# Patient Record
Sex: Female | Born: 1960 | Race: White | Hispanic: No | Marital: Married | Smoking: Never smoker
Health system: Southern US, Community
[De-identification: ages and names within clinical notes are randomized; demographics above are authoritative.]

## PROBLEM LIST (undated history)

## (undated) DIAGNOSIS — R112 Nausea with vomiting, unspecified: Secondary | ICD-10-CM

## (undated) DIAGNOSIS — Z9889 Other specified postprocedural states: Secondary | ICD-10-CM

## (undated) DIAGNOSIS — K219 Gastro-esophageal reflux disease without esophagitis: Secondary | ICD-10-CM

## (undated) DIAGNOSIS — R519 Headache, unspecified: Secondary | ICD-10-CM

## (undated) DIAGNOSIS — IMO0002 Reserved for concepts with insufficient information to code with codable children: Secondary | ICD-10-CM

## (undated) DIAGNOSIS — L719 Rosacea, unspecified: Secondary | ICD-10-CM

## (undated) DIAGNOSIS — M1990 Unspecified osteoarthritis, unspecified site: Secondary | ICD-10-CM

## (undated) DIAGNOSIS — T7840XA Allergy, unspecified, initial encounter: Secondary | ICD-10-CM

## (undated) DIAGNOSIS — E119 Type 2 diabetes mellitus without complications: Secondary | ICD-10-CM

## (undated) DIAGNOSIS — Z9289 Personal history of other medical treatment: Secondary | ICD-10-CM

## (undated) DIAGNOSIS — M7989 Other specified soft tissue disorders: Secondary | ICD-10-CM

## (undated) DIAGNOSIS — Z8489 Family history of other specified conditions: Secondary | ICD-10-CM

## (undated) DIAGNOSIS — E785 Hyperlipidemia, unspecified: Secondary | ICD-10-CM

## (undated) DIAGNOSIS — J069 Acute upper respiratory infection, unspecified: Secondary | ICD-10-CM

## (undated) DIAGNOSIS — K649 Unspecified hemorrhoids: Secondary | ICD-10-CM

## (undated) DIAGNOSIS — R87619 Unspecified abnormal cytological findings in specimens from cervix uteri: Secondary | ICD-10-CM

## (undated) DIAGNOSIS — R87612 Low grade squamous intraepithelial lesion on cytologic smear of cervix (LGSIL): Secondary | ICD-10-CM

## (undated) DIAGNOSIS — J189 Pneumonia, unspecified organism: Secondary | ICD-10-CM

## (undated) DIAGNOSIS — G4483 Primary cough headache: Secondary | ICD-10-CM

## (undated) DIAGNOSIS — R42 Dizziness and giddiness: Secondary | ICD-10-CM

## (undated) DIAGNOSIS — R51 Headache: Secondary | ICD-10-CM

## (undated) DIAGNOSIS — J45909 Unspecified asthma, uncomplicated: Secondary | ICD-10-CM

## (undated) DIAGNOSIS — G500 Trigeminal neuralgia: Secondary | ICD-10-CM

## (undated) DIAGNOSIS — R7303 Prediabetes: Secondary | ICD-10-CM

## (undated) DIAGNOSIS — I10 Essential (primary) hypertension: Secondary | ICD-10-CM

## (undated) DIAGNOSIS — R7301 Impaired fasting glucose: Secondary | ICD-10-CM

## (undated) HISTORY — DX: Headache, unspecified: R51.9

## (undated) HISTORY — DX: Reserved for concepts with insufficient information to code with codable children: IMO0002

## (undated) HISTORY — DX: Gastro-esophageal reflux disease without esophagitis: K21.9

## (undated) HISTORY — DX: Trigeminal neuralgia: G50.0

## (undated) HISTORY — DX: Low grade squamous intraepithelial lesion on cytologic smear of cervix (LGSIL): R87.612

## (undated) HISTORY — PX: LEEP: SHX91

## (undated) HISTORY — DX: Morbid (severe) obesity due to excess calories: E66.01

## (undated) HISTORY — DX: Impaired fasting glucose: R73.01

## (undated) HISTORY — DX: Unspecified abnormal cytological findings in specimens from cervix uteri: R87.619

## (undated) HISTORY — DX: Primary cough headache: G44.83

## (undated) HISTORY — DX: Essential (primary) hypertension: I10

## (undated) HISTORY — DX: Type 2 diabetes mellitus without complications: E11.9

## (undated) HISTORY — DX: Hyperlipidemia, unspecified: E78.5

## (undated) HISTORY — DX: Other specified soft tissue disorders: M79.89

## (undated) HISTORY — DX: Unspecified asthma, uncomplicated: J45.909

## (undated) HISTORY — DX: Allergy, unspecified, initial encounter: T78.40XA

## (undated) HISTORY — DX: Headache: R51

## (undated) HISTORY — DX: Rosacea, unspecified: L71.9

## (undated) HISTORY — PX: CHOLECYSTECTOMY: SHX55

## (undated) HISTORY — DX: Unspecified osteoarthritis, unspecified site: M19.90

---

## 2009-10-23 ENCOUNTER — Encounter: Admission: RE | Admit: 2009-10-23 | Discharge: 2009-12-14 | Payer: Self-pay | Admitting: Sports Medicine

## 2011-09-09 ENCOUNTER — Ambulatory Visit: Payer: BC Managed Care – PPO | Attending: Orthopedic Surgery | Admitting: Physical Therapy

## 2011-09-09 DIAGNOSIS — M25669 Stiffness of unspecified knee, not elsewhere classified: Secondary | ICD-10-CM | POA: Insufficient documentation

## 2011-09-09 DIAGNOSIS — M25569 Pain in unspecified knee: Secondary | ICD-10-CM | POA: Insufficient documentation

## 2011-09-09 DIAGNOSIS — M6281 Muscle weakness (generalized): Secondary | ICD-10-CM | POA: Insufficient documentation

## 2011-09-12 ENCOUNTER — Ambulatory Visit: Payer: BC Managed Care – PPO | Admitting: Physical Therapy

## 2011-09-16 ENCOUNTER — Ambulatory Visit: Payer: BC Managed Care – PPO | Admitting: Physical Therapy

## 2011-09-19 ENCOUNTER — Ambulatory Visit: Payer: BC Managed Care – PPO | Admitting: Physical Therapy

## 2011-09-23 ENCOUNTER — Encounter: Payer: BC Managed Care – PPO | Admitting: Physical Therapy

## 2011-09-24 ENCOUNTER — Ambulatory Visit: Payer: BC Managed Care – PPO | Admitting: Physical Therapy

## 2011-09-26 ENCOUNTER — Ambulatory Visit: Payer: BC Managed Care – PPO | Admitting: Physical Therapy

## 2011-09-30 ENCOUNTER — Encounter: Payer: BC Managed Care – PPO | Admitting: Physical Therapy

## 2011-10-03 ENCOUNTER — Ambulatory Visit: Payer: BC Managed Care – PPO | Attending: Orthopedic Surgery | Admitting: Physical Therapy

## 2011-10-03 DIAGNOSIS — M6281 Muscle weakness (generalized): Secondary | ICD-10-CM | POA: Insufficient documentation

## 2011-10-03 DIAGNOSIS — M25569 Pain in unspecified knee: Secondary | ICD-10-CM | POA: Insufficient documentation

## 2011-10-03 DIAGNOSIS — M25669 Stiffness of unspecified knee, not elsewhere classified: Secondary | ICD-10-CM | POA: Insufficient documentation

## 2011-10-07 ENCOUNTER — Encounter: Payer: BC Managed Care – PPO | Admitting: Physical Therapy

## 2012-03-03 ENCOUNTER — Other Ambulatory Visit (HOSPITAL_BASED_OUTPATIENT_CLINIC_OR_DEPARTMENT_OTHER): Payer: Self-pay | Admitting: Family Medicine

## 2012-03-03 ENCOUNTER — Ambulatory Visit (HOSPITAL_BASED_OUTPATIENT_CLINIC_OR_DEPARTMENT_OTHER)
Admission: RE | Admit: 2012-03-03 | Discharge: 2012-03-03 | Disposition: A | Discharge status: Home / Self Care | Payer: BC Managed Care – PPO | Source: Ambulatory Visit | Attending: Family Medicine | Admitting: Family Medicine

## 2012-03-03 DIAGNOSIS — R51 Headache: Secondary | ICD-10-CM | POA: Insufficient documentation

## 2012-03-03 DIAGNOSIS — J329 Chronic sinusitis, unspecified: Secondary | ICD-10-CM

## 2012-03-20 LAB — HM PAP SMEAR: HM Pap smear: ABNORMAL

## 2012-05-06 ENCOUNTER — Ambulatory Visit: Payer: BC Managed Care – PPO | Admitting: Physical Therapy

## 2012-08-26 HISTORY — PX: OTHER SURGICAL HISTORY: SHX169

## 2012-11-10 ENCOUNTER — Telehealth: Payer: Self-pay | Admitting: Family Medicine

## 2012-11-10 NOTE — Telephone Encounter (Signed)
This medication was called in to her [facility]. PG

## 2012-11-10 NOTE — Telephone Encounter (Signed)
Lost RX for Rosacea was seen last Tuesday. Pharmacy: [facility]

## 2012-11-12 DIAGNOSIS — K219 Gastro-esophageal reflux disease without esophagitis: Secondary | ICD-10-CM | POA: Insufficient documentation

## 2012-12-02 ENCOUNTER — Encounter: Payer: Self-pay | Admitting: Family Medicine

## 2012-12-02 ENCOUNTER — Ambulatory Visit (INDEPENDENT_AMBULATORY_CARE_PROVIDER_SITE_OTHER): Payer: BC Managed Care – PPO | Admitting: Family Medicine

## 2012-12-02 ENCOUNTER — Encounter: Payer: BC Managed Care – PPO | Admitting: Family Medicine

## 2012-12-02 VITALS — BP 124/78 | HR 62 | Ht 63.5 in | Wt 256.0 lb

## 2012-12-02 DIAGNOSIS — R51 Headache: Secondary | ICD-10-CM

## 2012-12-02 DIAGNOSIS — R42 Dizziness and giddiness: Secondary | ICD-10-CM

## 2012-12-02 MED ORDER — CELECOXIB 200 MG PO CAPS: 200.0000 mg | ORAL_CAPSULE | Freq: Two times a day (BID) | ORAL | Status: AC

## 2012-12-02 MED ORDER — MECLIZINE HCL 25 MG PO TABS: 25.0000 mg | ORAL_TABLET | Freq: Three times a day (TID) | ORAL | Status: DC | PRN

## 2012-12-02 NOTE — Patient Instructions (Addendum)
1)  Dizziness  A)  Hall-[place] Maneuver  B)  Antivert C) Decongestant - Phenylephrine 10 mg (1-2) up to 3 times per day.   2)  Knee Pain  A)  Change from Indomethacin to Celebrex plus try to increase your Tramadol to 2- 3 times per day and add Tylenol 1000 up to 3 times per day.     Dizziness Dizziness is a common problem. It is a feeling of unsteadiness or lightheadedness. You may feel like you are about to faint. Dizziness can lead to injury if you stumble or fall. A person of any age group can suffer from dizziness, but dizziness is more common in older adults. CAUSES  Dizziness can be caused by many different things, including:  Middle ear problems.  Standing for too long.  Infections.  An allergic reaction.  Aging.  An emotional response to something, such as the sight of blood.  Side effects of medicines.  Fatigue.  Problems with circulation or blood pressure.  Excess use of alcohol, medicines, or illegal drug use.  Breathing too fast (hyperventilation).  An arrhythmia or problems with your heart rhythm.  Low red blood cell count (anemia).  Pregnancy.  Vomiting, diarrhea, fever, or other illnesses that cause dehydration.  Diseases or conditions such as Parkinson's disease, high blood pressure (hypertension), diabetes, and thyroid problems.  Exposure to extreme heat. DIAGNOSIS  To find the cause of your dizziness, your caregiver may do a physical exam, lab tests, radiologic imaging scans, or an electrocardiography test (ECG).  TREATMENT  Treatment of dizziness depends on the cause of your symptoms and can vary greatly. HOME CARE INSTRUCTIONS   Drink enough fluids to keep your urine clear or pale yellow. This is especially important in very hot weather. In the elderly, it is also important in cold weather.  If your dizziness is caused by medicines, take them exactly as directed. When taking blood pressure medicines, it is especially important to get up  slowly.  Rise slowly from chairs and steady yourself until you feel okay.  In the morning, first sit up on the side of the bed. When this seems okay, stand slowly while holding onto something until you know your balance is fine.  If you need to stand in one place for a long time, be sure to move your legs often. Tighten and relax the muscles in your legs while standing.  If dizziness continues to be a problem, have someone stay with you for a day or two. Do this until you feel you are well enough to stay alone. Have the person call your caregiver if he or she notices changes in you that are concerning.  Do not drive or use heavy machinery if you feel dizzy.  Do not drink alcohol. SEEK IMMEDIATE MEDICAL CARE IF:   Your dizziness or lightheadedness gets worse.  You feel nauseous or vomit.  You develop problems with talking, walking, weakness, or using your arms, hands, or legs.  You are not thinking clearly or you have difficulty forming sentences. It may take a friend or family member to determine if your thinking is normal.  You develop chest pain, abdominal pain, shortness of breath, or sweating.  Your vision changes.  You notice any bleeding.  You have side effects from medicine that seems to be getting worse rather than better. MAKE SURE YOU:   Understand these instructions.  Will watch your condition.  Will get help right away if you are not doing well or get worse.  Document Released: 02/05/2001 Document Revised: 11/04/2011 Document Reviewed: 03/01/2011 [facility] Patient Information 2013 [place], [place].

## 2012-12-02 NOTE — Progress Notes (Signed)
Subjective:     Patient ID: Megan Petersen, female   DOB: [birth year], 52 y.o.   MRN: [id number]  HPI Rhylin is here today to discuss the headaches and dizziness she has been having for a couple of days.  She has taken Antivert in the past which has worked well for her.     Review of Systems  Neurological: Positive for dizziness and headaches.       Objective:   Physical Exam  Constitutional: She is oriented to person, place, and time. No distress.  HENT:  Head: Atraumatic.  Eyes: Pupils are equal, round, and reactive to light.  Neck: Neck supple.  Cardiovascular: Normal rate, regular rhythm and normal heart sounds.   Pulmonary/Chest: Effort normal and breath sounds normal.  Neurological: She is alert and oriented to person, place, and time.       Assessment:     Headache Dizziness     Plan:     Rx given for Antivert and Celebrex.

## 2012-12-06 ENCOUNTER — Encounter: Payer: Self-pay | Admitting: Family Medicine

## 2012-12-06 DIAGNOSIS — R51 Headache: Secondary | ICD-10-CM | POA: Insufficient documentation

## 2012-12-06 DIAGNOSIS — R42 Dizziness and giddiness: Secondary | ICD-10-CM | POA: Insufficient documentation

## 2012-12-06 DIAGNOSIS — R519 Headache, unspecified: Secondary | ICD-10-CM | POA: Insufficient documentation

## 2012-12-14 LAB — HM COLONOSCOPY

## 2013-01-01 ENCOUNTER — Ambulatory Visit (INDEPENDENT_AMBULATORY_CARE_PROVIDER_SITE_OTHER): Payer: BC Managed Care – PPO | Admitting: Family Medicine

## 2013-01-01 ENCOUNTER — Encounter: Payer: Self-pay | Admitting: Family Medicine

## 2013-01-01 VITALS — BP 127/73 | HR 61 | Temp 97.8°F | Resp 18 | Wt 259.0 lb

## 2013-01-01 DIAGNOSIS — M25569 Pain in unspecified knee: Secondary | ICD-10-CM

## 2013-01-01 DIAGNOSIS — M25561 Pain in right knee: Secondary | ICD-10-CM

## 2013-01-01 MED ORDER — METHYLPREDNISOLONE 4 MG PO KIT: PACK | ORAL | Status: DC

## 2013-01-01 MED ORDER — OMEGA-3-ACID ETHYL ESTERS 1 G PO CAPS: 2.0000 g | ORAL_CAPSULE | Freq: Two times a day (BID) | ORAL | Status: DC

## 2013-01-01 MED ORDER — PHENTERMINE-TOPIRAMATE ER 3.75-23 MG PO CP24: 1.0000 | ORAL_CAPSULE | Freq: Every day | ORAL | Status: DC

## 2013-01-01 MED ORDER — PHENTERMINE-TOPIRAMATE ER 7.5-46 MG PO CP24: 1.0000 | ORAL_CAPSULE | Freq: Every day | ORAL | Status: DC

## 2013-01-01 NOTE — Patient Instructions (Addendum)
1)  Knee Pain - Try adding another Indomethacin vs Celebrex, add Lovaza and consider Acupuncture ([facility] on [address]) for every 10 lbs off that is 40 lbs of pressure.       Diet Following Bariatric Surgery The bariatric diet is designed to provide fluids and nourishment while promoting weight loss after bariatric surgery. The diet is divided into 3 stages. The rate of progression varies based on individual food tolerance. DIET FOLLOWING BARIATRIC SURGERY The diet following surgery is divided into 3 stages to allow a gradual adjustment. It is very important to the success of your surgery to:  Progress to each stage slowly.  Eat at set times.  Chew food well and stop eating when you are full.  Not drink liquids 30 minutes before and after meals. If you feel tightness or pressure in your chest, that means you are full. Wait 30 minutes before you try to eat again. STAGE 1 BARIATRIC DIET - ABOUT 2 WEEKS IN DURATION   The diet begins the day of surgery. It will last about 1 to 2 weeks after surgery. Your surgeon may have individual guidelines for you about specific foods or the progression of your diet. Follow your surgeon's guidelines.  If clear liquids are well-tolerated without vomiting, your caregiver will add a 4 oz to 6 oz high protein, low-calorie liquid supplement. You could add this to your meal plan 3 times daily. You will need at least 60 g to 80 g of protein daily or as determined by your Registered Dietitian.  Guidelines for choosing a protein supplement include:  At least 15 g of protein per 8 oz serving.  Less than 20 g total carbohydrate per 8 oz serving.  Less than 5 g fat per 8 oz serving.  Avoid carbonated beverages, caffeine, alcohol, and concentrated sweets such as sugar, cakes, and cookies.  Right after surgery, you may only be able to eat 3 to 4 tsp per meal. Your maximum volume should not exceed  to  cup total. Do not eat or drink more than 1 oz or 2 tbs every 15  minutes.  Take a chewable multivitamin and mineral supplement.  Drink at least 48 oz of fluid daily, which includes your protein supplement. Food and beverages from the list below are allowed at set times (for example at 8 AM, 12 noon, or 5 PM):  Decaffeinated coffee or tea.  100% fruit juice.  Diet or sugar-free drinks.  Broth.  Blenderized soup.  Skim milk or lactose-free milk.  Sugar-free gelatin dessert or frozen ice pops.  Mashed potatoes.  Yogurt (artificially sweetened).  Sugar-free pudding.  Blended low-fat cottage cheese.  Unsweetened applesauce, grits, or hot wheat cereal. Four to six ounces of a liquid protein supplement from the list below is recommended for snacks at 10 AM, 2 PM, and 8 PM.  STAGE 2 BARIATRIC DIET (SOFT DIET) - ABOUT 4 WEEKS IN DURATION  About 2 weeks after surgery, your caregiver will progress your diet to this stage. Foods may need to be blended to the consistency of applesauce. Choose low-fat foods (less than 5 g of fat per serving) and avoid concentrated sweets and sugar (less than 10 g of sugar per serving). Meals should not exceed  to  cup total. This stage will last about 4 weeks. It is recommended that you meet with your dietitian at this stage to begin preparation for the last stage. This stage consists of 3 meals a day with a liquid protein supplement between  meals twice daily. Do not drink liquids with foods. You must wait 30 minutes for the stomach pouch to empty before drinking. Chew food well. The food must be almost liquified before swallowing. Soft foods from the list below can now be slowly added to your diet:  Soft fruit (soft canned fruit in light syrup or natural juice, banana, melon, peaches, pears, or strawberries).  Cooked vegetables.  Toast or crackers (becomes soft after chewing 20 times).  Hot wheat cereal.  Fish.  Eggs (scrambled, soft-boiled). STAGE 3 BARIATRIC DIET (REGULAR DIET) - ABOUT 6 to 8 WEEKS AFTER  SURGERY About 6 to 8 weeks after surgery, you will be advanced to food that is regular in texture. This diet should include all food groups. The diet will continue to promote weight loss. Meals should not exceed  to 1 cup total. Your dietitian will be available to assist you in meal planning and additional behavioral strategies to make this final stage a long-term success. Slowly add foods of regular consistency and remember:  Eat only at your chosen meal times.  Minimize drinking with meals. You should drink 30 minutes before eating. Do not start drinking again for about 2 hours after eating.  Chew food well. Take small bites.  Think about the portion size of a healthy frozen meal. You will be able to eat most of this.  Make sure your meal is balanced with starch, protein, fruits, and vegetables.                                                                                                             When you feel full, stop eating. Document Released: 02/16/2003 Document Revised: 11/04/2011 Document Reviewed: 11/09/2010 [facility] Patient Information 2013 [place], [place].

## 2013-01-10 ENCOUNTER — Encounter: Payer: Self-pay | Admitting: Family Medicine

## 2013-01-10 DIAGNOSIS — M25561 Pain in right knee: Secondary | ICD-10-CM | POA: Insufficient documentation

## 2013-01-10 DIAGNOSIS — M25562 Pain in left knee: Secondary | ICD-10-CM | POA: Insufficient documentation

## 2013-01-10 NOTE — Progress Notes (Signed)
Subjective:    Patient ID: Megan Petersen, female    DOB: [birth year], 52 y.o.   MRN: [id number]  Megan Petersen is here today to discuss her knee pain and weight.  She is wanting to get gastric bypass surgery at [facility]. Her secondary insurance [employer]) is requiring her to do a formal diet plan for 6 months before they will pay for her surgery.    Knee Pain  The incident occurred more than 1 week ago. The pain is present in the left knee and right knee. The quality of the pain is described as aching, burning and stabbing. The pain is at a severity of 7/10. The pain is moderate. The pain has been worsening since onset. The symptoms are aggravated by movement. She has tried acetaminophen, NSAIDs, ice, heat and rest for the symptoms. The treatment provided mild relief.    Review of Systems  Constitutional: Negative for activity change, fatigue and unexpected weight change.  HENT: Negative.   Eyes: Negative.   Respiratory: Negative for shortness of breath.   Cardiovascular: Negative for chest pain, palpitations and leg swelling.  Gastrointestinal: Negative for diarrhea and constipation.  Endocrine: Negative.   Genitourinary: Negative for difficulty urinating.  Musculoskeletal: Positive for joint swelling. Arthralgias: Knee Pain   Skin: Negative.   Neurological: Negative.   Hematological: Negative for adenopathy. Does not bruise/bleed easily.  Psychiatric/Behavioral: Negative for sleep disturbance and dysphoric mood. The patient is not nervous/anxious.    Past Medical History  Diagnosis Date  . Allergy   . Hypertension   . Hyperlipidemia   . Asthma   . GERD (gastroesophageal reflux disease)   . Morbid obesity   . Osteoarthritis   . Rosacea   . Trigeminal neuralgia   . LSIL (low grade squamous intraepithelial lesion) on Pap smear   . Atypical glandular cells on Pap smear    Family History  Problem Relation Age of Onset  . Heart disease Mother   . Hypertension Mother   . Cancer Mother    . Drug abuse Sister   . Hypertension Sister   . Drug abuse Brother   . Hypertension Brother    History   Social History Narrative   Marital Status: Married Geneticist, molecular)   Children:  Sons Megan Petersen, [place] [Megan Petersen]); Daughter Megan Petersen)    Living Situation:  Lives with spouse and children.   Occupation: Chief Financial Officer)   Education:  Engineer, agricultural   Tobacco Use/Exposure:  None    Alcohol Use: None   Drug Use:  None   Diet:  Regular   Exercise:  Minimal   Hobbies:  Reading, Traveling, Movies, Knitting)                 Objective:   Physical Exam  Constitutional: She appears well-nourished. No distress.  HENT:  Head: Normocephalic.  Eyes: No scleral icterus.  Neck: No thyromegaly present.  Cardiovascular: Normal rate, regular rhythm and normal heart sounds.   Pulmonary/Chest: Effort normal and breath sounds normal.  Abdominal: There is no tenderness.  Musculoskeletal: She exhibits no edema and no tenderness.       Right knee: She exhibits decreased range of motion and swelling. She exhibits no deformity, no erythema, normal alignment, no LCL laxity, normal patellar mobility, no bony tenderness, normal meniscus and no MCL laxity.       Left knee: She exhibits decreased range of motion and swelling. She exhibits no deformity, no erythema, normal alignment, no LCL laxity, normal patellar mobility, no bony tenderness, normal meniscus  and no MCL laxity.  Neurological: She is alert.  Skin: Skin is warm and dry.  Psychiatric: She has a normal mood and affect. Her behavior is normal. Judgment and thought content normal.          Assessment & Plan:

## 2013-01-10 NOTE — Assessment & Plan Note (Addendum)
She was given a prescription for Qsymia.  She is going to try to limit her calories to no more than 1500 calories per day.  She was given a handout for a diet to follow after gastric bypass.

## 2013-01-10 NOTE — Assessment & Plan Note (Signed)
She is going to try Celebrex and Lovaza for her knee pain.  She was also given a prescription for a Medrol Dose Pak.

## 2013-02-02 ENCOUNTER — Encounter: Payer: Self-pay | Admitting: Family Medicine

## 2013-02-02 ENCOUNTER — Ambulatory Visit (INDEPENDENT_AMBULATORY_CARE_PROVIDER_SITE_OTHER): Payer: BC Managed Care – PPO | Admitting: Family Medicine

## 2013-02-02 VITALS — BP 129/81 | HR 69 | Wt 263.0 lb

## 2013-02-02 DIAGNOSIS — M25562 Pain in left knee: Secondary | ICD-10-CM

## 2013-02-02 DIAGNOSIS — M25569 Pain in unspecified knee: Secondary | ICD-10-CM

## 2013-02-02 DIAGNOSIS — M25561 Pain in right knee: Secondary | ICD-10-CM

## 2013-02-02 MED ORDER — PHENDIMETRAZINE TARTRATE 35 MG PO TABS: 1.0000 | ORAL_TABLET | Freq: Three times a day (TID) | ORAL | Status: DC

## 2013-02-02 NOTE — Progress Notes (Signed)
  Subjective:    Patient ID: Megan Petersen, female    DOB: [birth year], 52 y.o.   MRN: [id number]  HPI Megan Petersen is here today to follow up on her knee pain and her weight.  Her pain improved for a short period of time after she received a steroid shot at her last office visit but it has returned.  She continues to struggle with her weight.  She has not lost anything over the past month.     Review of Systems  Constitutional: Negative for activity change, fatigue and unexpected weight change.  HENT: Negative.   Eyes: Negative.   Respiratory: Negative for shortness of breath.   Cardiovascular: Negative for chest pain, palpitations and leg swelling.  Gastrointestinal: Negative for diarrhea and constipation.  Endocrine: Negative.   Genitourinary: Negative for difficulty urinating.  Musculoskeletal: Positive for arthralgias.       Bilateral Knees  Skin: Negative.   Neurological: Negative.   Hematological: Negative for adenopathy. Does not bruise/bleed easily.  Psychiatric/Behavioral: Negative for sleep disturbance and dysphoric mood. The patient is not nervous/anxious.     Past Medical History  Diagnosis Date  . Allergy   . Hypertension   . Hyperlipidemia   . Asthma   . GERD (gastroesophageal reflux disease)   . Morbid obesity   . Osteoarthritis   . Rosacea   . Trigeminal neuralgia   . LSIL (low grade squamous intraepithelial lesion) on Pap smear   . Atypical glandular cells on Pap smear   . Primary cough headache   . Osteoarthritis   . Esophageal reflux   . Impaired fasting glucose   . Swelling of limb   . Headache     Family History  Problem Relation Age of Onset  . Heart disease Mother   . Hypertension Mother   . Cancer Mother     Ovarian  . Drug abuse Sister   . Hypertension Sister   . Diabetes Sister   . Drug abuse Brother   . Hypertension Brother   . Diabetes Brother     History   Social History Narrative   Marital Status: Married Geneticist, molecular)   Children:  Sons  Jeri Modena, [place] [Lucas]); Daughter Leotis Shames)    Living Situation:  Lives with spouse and children.   Occupation: Chief Financial Officer)   Education:  Engineer, agricultural   Tobacco Use/Exposure:  None    Alcohol Use: None   Drug Use:  None   Diet:  Regular   Exercise:  Minimal   Hobbies:  Reading, Traveling, Movies, Knitting)                 Objective:   Physical Exam  Constitutional: She appears well-nourished. No distress.  HENT:  Head: Normocephalic.  Eyes: No scleral icterus.  Neck: No thyromegaly present.  Cardiovascular: Normal rate, regular rhythm and normal heart sounds.   Pulmonary/Chest: Effort normal and breath sounds normal.  Abdominal: There is no tenderness.  Musculoskeletal: She exhibits no edema and no tenderness.  Neurological: She is alert.  Skin: Skin is warm and dry.  Psychiatric: She has a normal mood and affect. Her behavior is normal. Judgment and thought content normal.          Assessment & Plan:

## 2013-02-02 NOTE — Patient Instructions (Addendum)
1)  Weight - Download My Fitness Pal - Follow a low carb diet like [employer] vs Sugar [employer]usters. Limit calories to 1200 per day.  Get at least 1 hour of exercise per day.    High Protein Diet A high protein diet means that high protein foods are added to your diet. Getting more protein in the diet is important for a number of reasons. Protein helps the body to build tissue, muscle, and to repair damage. People who have had surgery, injuries such as broken bones, infections, and burns, or illnesses such as cancer, may need more protein in their diet.  SERVING SIZES Measuring foods and serving sizes helps to make sure you are getting the right amount of food. The list below tells how big or small some common serving sizes are.   1 oz.........4 stacked dice.  3 oz........Marland KitchenDeck of cards.  1 tsp.......Marland KitchenTip of little finger.  1 tbs......Marland KitchenMarland KitchenThumb.  2 tbs.......Marland KitchenGolf ball.   cup......Marland KitchenHalf of a fist.  1 cup.......Marland KitchenA fist. FOOD SOURCES OF PROTEIN Listed below are some food sources of protein and the amount of protein they contain. Your Registered Dietitian can calculate how many grams of protein you need for your medical condition. High protein foods can be added to the diet at mealtime or as snacks. Be sure to have at least 1 protein-containing food at each meal and snack to ensure adequate intake.  Meats and Meat Substitutes / Protein (g)  3 oz poultry (chicken, [place]) / 26 g  3 oz tuna, canned in water / 26 g  3 oz fish (cod) / 21 g  3 oz red meat (beef, pork) / 21 g  4 oz tofu / 9 g  1 egg / 6 g   cup egg substitute / 5 g  1 cup dried beans / 15 g  1 cup soy milk / 4 g Dairy / Protein (g)  1 cup milk (skim, 1%, 2%, whole) / 8 g   cup evaporated milk / 9 g  1 cup buttermilk / 8 g  1 cup low-fat plain yogurt / 11 g  1 cup regular plain yogurt / 9 g   cup cottage cheese / 14 g  1 oz cheddar cheese / 7 g Nuts / Protein (g)  2 tbs peanut butter / 8 g  1 oz  peanuts / 7 g  2 tbs cashews / 5 g  2 tbs almonds / 5 g Document Released: 08/12/2005 Document Revised: 11/04/2011 Document Reviewed: 05/15/2007 [employer] Patient Information 2014 [employer]  1200 Calorie Diabetic Diet The 1200 calorie diabetic diet limits calories to 1200 each day. Following this diet and making healthy meal choices can help improve overall health. It controls blood glucose (sugar) levels and can also help lower blood pressure and cholesterol.  SERVING SIZES Measuring foods and serving sizes helps to make sure you are getting the right amount of food. The list below tells how big or small some common serving sizes are.   1 oz.........4 stacked dice.  3 oz........Marland KitchenDeck of cards.  1 tsp.......Marland KitchenTip of little finger.  1 tbs......Marland KitchenMarland KitchenThumb.  2 tbs.......Marland KitchenGolf ball.   cup......Marland KitchenHalf of a fist.  1 cup.......Marland KitchenA fist. GUIDELINES FOR CHOOSING FOODS The goal of this diet is to eat a variety of foods and limit calories to 1200 each day. This can be done by choosing foods that are low in calories and fat. The diet also suggests eating small amounts of food frequently. Doing this helps control your blood glucose  levels, so they do not get too high or too low. Each meal or snack may include a protein food source to help you feel more satisfied. Try to eat about the same amount of food around the same time each day. This includes weekend days, travel days, and days off work. Space your meals about 4 to 5 hours apart, and add a snack between them, if you wish.  For example, a daily food plan could include breakfast, a morning snack, lunch, dinner, and an evening snack. Healthy meals and snacks have different types of foods, including whole grains, vegetables, fruits, lean meats, poultry, fish, and dairy products. As you plan your meals, select a variety of foods. Choose from the bread and starch, vegetable, fruit, dairy, and meat/protein groups. Examples of foods from each group are  listed below, with their suggested serving sizes. Use measuring cups and spoons to become familiar with what a healthy portion looks like. Bread and Starch Each serving equals 15 grams of carbohydrate.  1 slice bread.   bagel.   cup cold cereal (unsweetened).   cup hot cereal or mashed potatoes.  1 small potato (size of a computer mouse).   cup cooked pasta or rice.   English muffin.  1 cup broth-based soup.  3 cups of popcorn.  4 to 6 whole-wheat crackers.   cup cooked beans, peas, or corn. Vegetables Each serving equals 5 grams of carbohydrate.   cup cooked vegetables.  1 cup raw vegetables.   cup tomato or vegetable juice. Fruit Each serving equals 15 grams of carbohydrate.  1 small apple or orange.  1  cup watermelon or strawberries.   cup applesauce (no sugar added).  2 tbs raisins.   banana.   cup canned fruit, packed in water or in its own juice.   cup unsweetened fruit juice. Dairy Each serving equals 12 to 15 grams of carbohydrate.  1 cup fat-free milk.  6 oz artificially sweetened yogurt or plain yogurt.  1 cup low-fat buttermilk.  1 cup soy milk.  1 cup almond milk. Meat/Protein  1 large egg.  2 to 3 oz meat, poultry, or fish.   cup low-fat cottage cheese.  1 tbs peanut butter.  1 oz low-fat cheese.   cup tuna, packed in water.   cup tofu. Fat  1 tsp oil.  1 tsp trans-fat-free margarine.  1 tsp butter.  1 tsp mayonnaise.  2 tbs avocado.  1 tbs salad dressing.  1 tbs cream cheese.  2 tbs sour cream. SAMPLE 1200 CALORIE DIET PLAN Breakfast  1 cup fat-free milk (1 carb serving).  1 small orange (1 carb serving).  1 scrambled egg.  1 slice whole-wheat toast (1 carb serving). Lunch  Sandwich  2 slices whole-wheat bread (2 carb servings).  2 oz lean meat.  2 tsp reduced fat mayonnaise.  1 lettuce leaf.  2 slices tomato.  1 cup carrot sticks. Afternoon Snack  1 small apple (1  carb serving).  1 string cheese. Dinner  2 oz meat.  1 small baked potato (1 carb serving).  1 tsp trans-fat-free margarine.  1 cup steamed broccoli.  1 cup fat-free milk (1 carb serving). Evening Snack   small banana (1 carb serving).  6 vanilla wafers (1 carb serving). MEAL PLAN You can use this worksheet to help you make a daily meal plan based on the 1200 calorie diabetic diet suggestions. If you are using this plan to help you control your blood glucose, you may  interchange carbohydrate containing foods (dairy, starches, and fruits). Select a variety of fresh foods of varying colors and flavors. The total amount of carbohydrate in your meals or snacks is more important than making sure you include all of the food groups every time you eat. You can choose from approximately this many of the following foods to build your day's meals:  6 Starches.  3 Vegetables.  2 Fruits.  2 Dairy.  4 to 6 oz Meat/Protein.  Up to 3 Fats. Your dietician can use this worksheet to help you decide how many servings and which types of foods are right for you. BREAKFAST Food Group and Servings / Food Choice Starch _________________________________________________________ Dairy __________________________________________________________ Fruit ___________________________________________________________ Meat/Protein____________________________________________________ Fat ____________________________________________________________ LUNCH Food Group and Servings / Food Choice  Starch _________________________________________________________ Meat/Protein ___________________________________________________ Vegetables _____________________________________________________ Fruit __________________________________________________________ Dairy __________________________________________________________ Fat ____________________________________________________________ Aura Fey Food Group and  Servings / Food Choice Dairy __________________________________________________________ Fruit ___________________________________________________________ Starch __________________________________________________________ Meat/Protein____________________________________________________ Laural Golden Food Group and Servings / Food Choice Starch _________________________________________________________ Meat/Protein ___________________________________________________ Dairy __________________________________________________________ Vegetables _____________________________________________________ Fruit __________________________________________________________ Fat ____________________________________________________________ Lollie Sails Food Group and Servings / Food Choice Fruit ___________________________________________________________ Meat/Protein ____________________________________________________ Dairy __________________________________________________________ Starch __________________________________________________________ DAILY TOTALS Starches _________________________ Vegetables _______________________ Fruits ____________________________ Dairy ____________________________ Meat/Protein______________________ Fats _____________________________ Document Released: 03/04/2005 Document Revised: 11/04/2011 Document Reviewed: 06/29/2009 [employer] Patient Information 2014 [employer]

## 2013-02-15 ENCOUNTER — Ambulatory Visit (INDEPENDENT_AMBULATORY_CARE_PROVIDER_SITE_OTHER): Payer: BC Managed Care – PPO | Admitting: Family Medicine

## 2013-02-15 ENCOUNTER — Encounter: Payer: Self-pay | Admitting: Family Medicine

## 2013-02-15 VITALS — BP 150/82 | HR 88 | Temp 99.9°F | Wt 256.0 lb

## 2013-02-15 DIAGNOSIS — J069 Acute upper respiratory infection, unspecified: Secondary | ICD-10-CM

## 2013-02-15 DIAGNOSIS — R059 Cough, unspecified: Secondary | ICD-10-CM

## 2013-02-15 DIAGNOSIS — R51 Headache: Secondary | ICD-10-CM

## 2013-02-15 DIAGNOSIS — R05 Cough: Secondary | ICD-10-CM

## 2013-02-15 MED ORDER — AZITHROMYCIN 500 MG PO TABS: ORAL_TABLET | ORAL | Status: DC

## 2013-02-15 MED ORDER — BENZONATATE 200 MG PO CAPS: 200.0000 mg | ORAL_CAPSULE | Freq: Three times a day (TID) | ORAL | Status: DC | PRN

## 2013-02-15 MED ORDER — KETOROLAC TROMETHAMINE 60 MG/2ML IM SOLN
60.0000 mg | Freq: Once | INTRAMUSCULAR | Status: AC
  Administered 2013-02-15: 60 mg via INTRAMUSCULAR

## 2013-02-15 MED ORDER — METHYLPREDNISOLONE SODIUM SUCC 125 MG IJ SOLR
125.0000 mg | Freq: Once | INTRAMUSCULAR | Status: AC
  Administered 2013-02-15: 125 mg via INTRAMUSCULAR

## 2013-02-15 MED ORDER — MUCINEX DM MAXIMUM STRENGTH 60-1200 MG PO TB12: 1.0000 | ORAL_TABLET | Freq: Two times a day (BID) | ORAL | Status: DC

## 2013-02-15 MED ORDER — FEXOFENADINE-PSEUDOEPHED ER 60-120 MG PO TB12: 1.0000 | ORAL_TABLET | Freq: Two times a day (BID) | ORAL | Status: DC

## 2013-02-15 NOTE — Patient Instructions (Addendum)
1)  Head Congestion/Cough - YOU SHOULD HAVE STARTING TAKING COLD EEZE AND THE [employer] COLDC[employer] DROPS ON Thursday OR WHENEVER YOU FIRST FELT THIS COMING ON.  I promise if you do this in the future you won't get as sick and you will get better faster.  Now we just need to manage your symptoms.   Head - Allegra D 60/120 1-2 times per day   Chest/Cough - Mucinex DM 1200/60 1-2 times per day plus Tessalon Perles and Umcka Cold Care (2 droppers 3-4 times per day) Suck on Riccola Cough Drops (Honey Lemon with Echinacea), Vick's Vapor Rub.    At this point, I just think it is a virus but if you are one week into this and no better then you can take an antibiotic.     Upper Respiratory Infection, Adult An upper respiratory infection (URI) is also sometimes known as the common cold. The upper respiratory tract includes the nose, sinuses, throat, trachea, and bronchi. Bronchi are the airways leading to the lungs. Most people improve within 1 week, but symptoms can last up to 2 weeks. A residual cough may last even longer.  CAUSES Many different viruses can infect the tissues lining the upper respiratory tract. The tissues become irritated and inflamed and often become very moist. Mucus production is also common. A cold is contagious. You can easily spread the virus to others by oral contact. This includes kissing, sharing a glass, coughing, or sneezing. Touching your mouth or nose and then touching a surface, which is then touched by another person, can also spread the virus. SYMPTOMS  Symptoms typically develop 1 to 3 days after you come in contact with a cold virus. Symptoms vary from person to person. They may include:  Runny nose.  Sneezing.  Nasal congestion.  Sinus irritation.  Sore throat.  Loss of voice (laryngitis).  Cough.  Fatigue.  Muscle aches.  Loss of appetite.  Headache.  Low-grade fever. DIAGNOSIS  You might diagnose your own cold based on familiar symptoms, since most  people get a cold 2 to 3 times a year. Your caregiver can confirm this based on your exam. Most importantly, your caregiver can check that your symptoms are not due to another disease such as strep throat, sinusitis, pneumonia, asthma, or epiglottitis. Blood tests, throat tests, and X-rays are not necessary to diagnose a common cold, but they may sometimes be helpful in excluding other more serious diseases. Your caregiver will decide if any further tests are required. RISKS AND COMPLICATIONS  You may be at risk for a more severe case of the common cold if you smoke cigarettes, have chronic heart disease (such as heart failure) or lung disease (such as asthma), or if you have a weakened immune system. The very young and very old are also at risk for more serious infections. Bacterial sinusitis, middle ear infections, and bacterial pneumonia can complicate the common cold. The common cold can worsen asthma and chronic obstructive pulmonary disease (COPD). Sometimes, these complications can require emergency medical care and may be life-threatening. PREVENTION  The best way to protect against getting a cold is to practice good hygiene. Avoid oral or hand contact with people with cold symptoms. Wash your hands often if contact occurs. There is no clear evidence that vitamin C, vitamin E, echinacea, or exercise reduces the chance of developing a cold. However, it is always recommended to get plenty of rest and practice good nutrition. TREATMENT  Treatment is directed at relieving symptoms. There is  no cure. Antibiotics are not effective, because the infection is caused by a virus, not by bacteria. Treatment may include:  Increased fluid intake. Sports drinks offer valuable electrolytes, sugars, and fluids.  Breathing heated mist or steam (vaporizer or shower).  Eating chicken soup or other clear broths, and maintaining good nutrition.  Getting plenty of rest.  Using gargles or lozenges for  comfort.  Controlling fevers with ibuprofen or acetaminophen as directed by your caregiver.  Increasing usage of your inhaler if you have asthma. Zinc gel and zinc lozenges, taken in the first 24 hours of the common cold, can shorten the duration and lessen the severity of symptoms. Pain medicines may help with fever, muscle aches, and throat pain. A variety of non-prescription medicines are available to treat congestion and runny nose. Your caregiver can make recommendations and may suggest nasal or lung inhalers for other symptoms.  HOME CARE INSTRUCTIONS   Only take over-the-counter or prescription medicines for pain, discomfort, or fever as directed by your caregiver.  Use a warm mist humidifier or inhale steam from a shower to increase air moisture. This may keep secretions moist and make it easier to breathe.  Drink enough water and fluids to keep your urine clear or pale yellow.  Rest as needed.  Return to work when your temperature has returned to normal or as your caregiver advises. You may need to stay home longer to avoid infecting others. You can also use a face mask and careful hand washing to prevent spread of the virus. SEEK MEDICAL CARE IF:   After the first few days, you feel you are getting worse rather than better.  You need your caregiver's advice about medicines to control symptoms.  You develop chills, worsening shortness of breath, or brown or red sputum. These may be signs of pneumonia.  You develop yellow or brown nasal discharge or pain in the face, especially when you bend forward. These may be signs of sinusitis.  You develop a fever, swollen neck glands, pain with swallowing, or white areas in the back of your throat. These may be signs of strep throat. SEEK IMMEDIATE MEDICAL CARE IF:   You have a fever.  You develop severe or persistent headache, ear pain, sinus pain, or chest pain.  You develop wheezing, a prolonged cough, cough up blood, or have a  change in your usual mucus (if you have chronic lung disease).  You develop sore muscles or a stiff neck. Document Released: 02/05/2001 Document Revised: 11/04/2011 Document Reviewed: 12/14/2010 [facility] Patient Information 2014 [place], [place].

## 2013-02-15 NOTE — Progress Notes (Signed)
  Subjective:    Patient ID: Megan Petersen, female    DOB: Jun 06, 1961, 52 y.o.   MRN: [id number]  Megan Petersen is here today complaining of head congestion/sinus pain and pressure.     Sinusitis This is a recurrent problem. The current episode started in the past 7 days. The problem has been gradually worsening since onset. The maximum temperature recorded prior to her arrival was 100 - 100.9 F. The fever has been present for 3 to 4 days. Her pain is at a severity of 6/10. The pain is moderate. Associated symptoms include chills, congestion, coughing, ear pain and sinus pressure. Past treatments include oral decongestants. The treatment provided no relief.      Review of Systems  Constitutional: Positive for fever, chills and fatigue.  HENT: Positive for ear pain, congestion and sinus pressure.   Respiratory: Positive for cough.   Cardiovascular: Negative for chest pain.  Gastrointestinal: Negative.   Musculoskeletal: Negative.   Skin: Negative.    Past Medical History  Diagnosis Date  . Allergy   . Hypertension   . Hyperlipidemia   . Asthma   . GERD (gastroesophageal reflux disease)   . Morbid obesity   . Osteoarthritis   . Rosacea   . Trigeminal neuralgia   . LSIL (low grade squamous intraepithelial lesion) on Pap smear   . Atypical glandular cells on Pap smear   . Primary cough headache   . Osteoarthritis   . Esophageal reflux   . Impaired fasting glucose   . Swelling of limb   . Headache    Family History  Problem Relation Age of Onset  . Heart disease Mother   . Hypertension Mother   . Cancer Mother     Ovarian  . Drug abuse Sister   . Hypertension Sister   . Diabetes Sister   . Drug abuse Brother   . Hypertension Brother   . Diabetes Brother    History   Social History Narrative   Marital Status: Married Geneticist, molecular)   Children:  Sons Jeri [place], [place] [Lucas]); Daughter Leotis Shames)    Living Situation:  Lives with spouse and children.   Occupation: Chief Financial Officer)    Education:  Engineer, agricultural   Tobacco Use/Exposure:  None    Alcohol Use: None   Drug Use:  None   Diet:  Regular   Exercise:  Minimal   Hobbies:  Reading, Traveling, Movies, Knitting)                 Objective:   Physical Exam  Vitals reviewed. Constitutional: She appears well-nourished. No distress.  HENT:  Head: Normocephalic.  Mouth/Throat: No oropharyngeal exudate.  Eyes: Conjunctivae are normal. Right eye exhibits no discharge. Left eye exhibits no discharge.  Neck: Neck supple.  Cardiovascular: Normal rate, regular rhythm and normal heart sounds.  Exam reveals no gallop and no friction rub.   No murmur heard. Pulmonary/Chest: Effort normal and breath sounds normal. She has no wheezes. She exhibits no tenderness.  Lymphadenopathy:    She has no cervical adenopathy.  Neurological: She is alert.  Skin: Skin is warm and dry. No rash noted.  Psychiatric: She has a normal mood and affect.          Assessment & Plan:

## 2013-02-18 ENCOUNTER — Ambulatory Visit (INDEPENDENT_AMBULATORY_CARE_PROVIDER_SITE_OTHER): Payer: BC Managed Care – PPO | Admitting: Family Medicine

## 2013-02-18 ENCOUNTER — Ambulatory Visit (HOSPITAL_BASED_OUTPATIENT_CLINIC_OR_DEPARTMENT_OTHER)
Admission: RE | Admit: 2013-02-18 | Discharge: 2013-02-18 | Disposition: A | Discharge status: Home / Self Care | Payer: BC Managed Care – PPO | Source: Ambulatory Visit | Attending: Family Medicine | Admitting: Family Medicine

## 2013-02-18 VITALS — BP 137/78 | HR 69 | Temp 98.1°F | Wt 250.0 lb

## 2013-02-18 DIAGNOSIS — R0602 Shortness of breath: Secondary | ICD-10-CM | POA: Insufficient documentation

## 2013-02-18 DIAGNOSIS — R05 Cough: Secondary | ICD-10-CM | POA: Insufficient documentation

## 2013-02-18 DIAGNOSIS — I10 Essential (primary) hypertension: Secondary | ICD-10-CM | POA: Insufficient documentation

## 2013-02-18 DIAGNOSIS — R059 Cough, unspecified: Secondary | ICD-10-CM | POA: Insufficient documentation

## 2013-02-18 DIAGNOSIS — J45909 Unspecified asthma, uncomplicated: Secondary | ICD-10-CM | POA: Insufficient documentation

## 2013-02-18 DIAGNOSIS — R062 Wheezing: Secondary | ICD-10-CM

## 2013-02-18 DIAGNOSIS — J189 Pneumonia, unspecified organism: Secondary | ICD-10-CM

## 2013-02-18 MED ORDER — CEFTRIAXONE SODIUM 1 G IJ SOLR
1.0000 g | Freq: Once | INTRAMUSCULAR | Status: AC
  Administered 2013-02-18: 1 g via INTRAMUSCULAR

## 2013-02-18 MED ORDER — FLUCONAZOLE 200 MG PO TABS: ORAL_TABLET | ORAL | Status: DC

## 2013-02-18 MED ORDER — IPRATROPIUM-ALBUTEROL 20-100 MCG/ACT IN AERS: 1.0000 | INHALATION_SPRAY | Freq: Four times a day (QID) | RESPIRATORY_TRACT | Status: DC | PRN

## 2013-02-18 MED ORDER — AMOXICILLIN-POT CLAVULANATE ER 1000-62.5 MG PO TB12: 1.0000 | ORAL_TABLET | Freq: Two times a day (BID) | ORAL | Status: AC

## 2013-02-18 MED ORDER — TERCONAZOLE 0.8 % VA CREA: 1.0000 | TOPICAL_CREAM | Freq: Every day | VAGINAL | Status: DC

## 2013-02-18 NOTE — Patient Instructions (Addendum)
1)  Pneumonia - You got a shot of a strong antibiotic in the office today (Rocephin). Start on the Augmentin XR twice a day for 10 days to help the Zithromax which is in your system for 10 days.  Chest PT and deep breathing.  Use the Combivent as you would use the nebulizer.   Pneumonia, Adult Pneumonia is an infection of the lungs.  CAUSES Pneumonia may be caused by bacteria or a virus. Usually, these infections are caused by breathing infectious particles into the lungs (respiratory tract). SYMPTOMS   Cough.  Fever.  Chest pain.  Increased rate of breathing.  Wheezing.  Mucus production. DIAGNOSIS  If you have the common symptoms of pneumonia, your caregiver will typically confirm the diagnosis with a chest X-ray. The X-ray will show an abnormality in the lung (pulmonary infiltrate) if you have pneumonia. Other tests of your blood, urine, or sputum may be done to find the specific cause of your pneumonia. Your caregiver may also do tests (blood gases or pulse oximetry) to see how well your lungs are working. TREATMENT  Some forms of pneumonia may be spread to other people when you cough or sneeze. You may be asked to wear a mask before and during your exam. Pneumonia that is caused by bacteria is treated with antibiotic medicine. Pneumonia that is caused by the influenza virus may be treated with an antiviral medicine. Most other viral infections must run their course. These infections will not respond to antibiotics.  PREVENTION A pneumococcal shot (vaccine) is available to prevent a common bacterial cause of pneumonia. This is usually suggested for:  People over 63 years old.  Patients on chemotherapy.  People with chronic lung problems, such as bronchitis or emphysema.  People with immune system problems. If you are over 65 or have a high risk condition, you may receive the pneumococcal vaccine if you have not received it before. In some countries, a routine influenza vaccine is  also recommended. This vaccine can help prevent some cases of pneumonia.You may be offered the influenza vaccine as part of your care. If you smoke, it is time to quit. You may receive instructions on how to stop smoking. Your caregiver can provide medicines and counseling to help you quit. HOME CARE INSTRUCTIONS   Cough suppressants may be used if you are losing too much rest. However, coughing protects you by clearing your lungs. You should avoid using cough suppressants if you can.  Your caregiver may have prescribed medicine if he or she thinks your pneumonia is caused by a bacteria or influenza. Finish your medicine even if you start to feel better.  Your caregiver may also prescribe an expectorant. This loosens the mucus to be coughed up.  Only take over-the-counter or prescription medicines for pain, discomfort, or fever as directed by your caregiver.  Do not smoke. Smoking is a common cause of bronchitis and can contribute to pneumonia. If you are a smoker and continue to smoke, your cough may last several weeks after your pneumonia has cleared.  A cold steam vaporizer or humidifier in your room or home may help loosen mucus.  Coughing is often worse at night. Sleeping in a semi-upright position in a recliner or using a couple pillows under your head will help with this.  Get rest as you feel it is needed. Your body will usually let you know when you need to rest. SEEK IMMEDIATE MEDICAL CARE IF:   Your illness becomes worse. This is especially true  if you are elderly or weakened from any other disease.  You cannot control your cough with suppressants and are losing sleep.  You begin coughing up blood.  You develop pain which is getting worse or is uncontrolled with medicines.  You have a fever.  Any of the symptoms which initially brought you in for treatment ar                                                                                                                                                                                                                          MAKE SURE YOU:   Understand these instructions.  Will watch your condition.  Will get help right away if you are not doing well or get worse. Document Released: 08/12/2005 Document Revised: 11/04/2011 Document Reviewed: 11/01/2010 [facility] Patient Information 2014 [place], [place].

## 2013-02-18 NOTE — Progress Notes (Signed)
  Subjective:    Patient ID: Megan Petersen, female    DOB: [birth year], 52 y.o.   MRN: [id number]  HPI  Megan Petersen is back with worsening URI symptoms.  She is coughing more and has some pain in her chest.  She has taken Zithromax for 3 days.  She has also had a low grade fever and feels chilled.     Review of Systems  Constitutional: Positive for fever, chills and fatigue.  HENT: Positive for congestion and rhinorrhea.   Respiratory: Positive for cough, chest tightness, shortness of breath and wheezing.     Past Medical History  Diagnosis Date  . Allergy   . Hypertension   . Hyperlipidemia   . Asthma   . GERD (gastroesophageal reflux disease)   . Morbid obesity   . Osteoarthritis   . Rosacea   . Trigeminal neuralgia   . LSIL (low grade squamous intraepithelial lesion) on Pap smear   . Atypical glandular cells on Pap smear   . Primary cough headache   . Osteoarthritis   . Esophageal reflux   . Impaired fasting glucose   . Swelling of limb   . Headache     Family History  Problem Relation Age of Onset  . Heart disease Mother   . Hypertension Mother   . Cancer Mother     Ovarian  . Drug abuse Sister   . Hypertension Sister   . Diabetes Sister   . Drug abuse Brother   . Hypertension Brother   . Diabetes Brother     History   Social History Narrative   Marital Status: Married Geneticist, molecular)   Children:  Sons Megan Petersen, [place] [Lucas]); Daughter Megan Petersen)    Living Situation:  Lives with spouse and children.   Occupation: Chief Financial Officer)   Education:  Engineer, agricultural   Tobacco Use/Exposure:  None    Alcohol Use: None   Drug Use:  None   Diet:  Regular   Exercise:  Minimal   Hobbies:  Reading, Traveling, Movies, Knitting)                 Objective:   Physical Exam  Constitutional: She appears well-nourished. No distress.  HENT:  Head: Normocephalic.  Mouth/Throat: No oropharyngeal exudate.  Eyes: Conjunctivae are normal. Right eye exhibits no discharge.  Left eye exhibits no discharge.  Neck: Neck supple.  Cardiovascular: Normal rate, regular rhythm and normal heart sounds.  Exam reveals no gallop and no friction rub.   No murmur heard. Pulmonary/Chest: Effort normal and breath sounds normal. She has no wheezes. She exhibits tenderness.  Lymphadenopathy:    She has no cervical adenopathy.  Neurological: She is alert.  Skin: Skin is warm and dry. No rash noted.  Psychiatric: She has a normal mood and affect.      Assessment & Plan:

## 2013-02-27 NOTE — Assessment & Plan Note (Signed)
She was encouraged to use [employer] and is to limit her calorie intake to 1200 per day.

## 2013-02-27 NOTE — Assessment & Plan Note (Signed)
I reminded Megan Petersen that for every pound she loses, it takes 4 pounds of pressure off of her knees.  She is going to work harder on her diet and exercise.

## 2013-02-28 ENCOUNTER — Encounter: Payer: Self-pay | Admitting: Family Medicine

## 2013-02-28 DIAGNOSIS — R062 Wheezing: Secondary | ICD-10-CM | POA: Insufficient documentation

## 2013-02-28 DIAGNOSIS — J189 Pneumonia, unspecified organism: Secondary | ICD-10-CM | POA: Insufficient documentation

## 2013-02-28 DIAGNOSIS — R0602 Shortness of breath: Secondary | ICD-10-CM | POA: Insufficient documentation

## 2013-02-28 DIAGNOSIS — R059 Cough, unspecified: Secondary | ICD-10-CM | POA: Insufficient documentation

## 2013-02-28 DIAGNOSIS — R05 Cough: Secondary | ICD-10-CM | POA: Insufficient documentation

## 2013-02-28 NOTE — Assessment & Plan Note (Addendum)
She received a nebulizer treatment in the office and was given a prescription for Combivent.

## 2013-02-28 NOTE — Assessment & Plan Note (Addendum)
She was sent for a CXR which showed pneumonia.  She received an injection of Rocephin in the office today.  We'll add some Augmentin to the Zithromax.

## 2013-03-02 ENCOUNTER — Ambulatory Visit (INDEPENDENT_AMBULATORY_CARE_PROVIDER_SITE_OTHER): Payer: BC Managed Care – PPO | Admitting: Family Medicine

## 2013-03-02 ENCOUNTER — Encounter: Payer: Self-pay | Admitting: Family Medicine

## 2013-03-02 VITALS — BP 126/72 | HR 61 | Wt 255.0 lb

## 2013-03-02 DIAGNOSIS — J069 Acute upper respiratory infection, unspecified: Secondary | ICD-10-CM

## 2013-03-02 NOTE — Progress Notes (Signed)
  Subjective:    Patient ID: Megan Petersen, female    DOB: [birth year], 52 y.o.   MRN: [id number]  HPI  Megan Petersen is here today to get her short term disability forms completed.  Her URI symptoms have improved since her last office visit.  She also wants to follow up on her weight loss.    Review of Systems  Constitutional: Positive for unexpected weight change.  HENT: Negative for congestion.   Neurological: Negative for headaches.    Past Medical History  Diagnosis Date  . Allergy   . Hypertension   . Hyperlipidemia   . Asthma   . GERD (gastroesophageal reflux disease)   . Morbid obesity   . Osteoarthritis   . Rosacea   . Trigeminal neuralgia   . LSIL (low grade squamous intraepithelial lesion) on Pap smear   . Atypical glandular cells on Pap smear   . Primary cough headache   . Osteoarthritis   . Esophageal reflux   . Impaired fasting glucose   . Swelling of limb   . Headache     Family History  Problem Relation Age of Onset  . Heart disease Mother   . Hypertension Mother   . Cancer Mother     Ovarian  . Drug abuse Sister   . Hypertension Sister   . Diabetes Sister   . Drug abuse Brother   . Hypertension Brother   . Diabetes Brother     History   Social History Narrative   Marital Status: Married Geneticist, molecular)   Children:  Sons Jeri Modena, [place] [Lucas]); Daughter Leotis Shames)    Living Situation:  Lives with spouse and children.   Occupation: Chief Financial Officer)   Education:  Engineer, agricultural   Tobacco Use/Exposure:  None    Alcohol Use: None   Drug Use:  None   Diet:  Regular   Exercise:  Minimal   Hobbies:  Reading, Traveling, Movies, Knitting)                  Objective:   Physical Exam  Constitutional: She appears well-nourished. No distress.  HENT:  Head: Normocephalic.  Mouth/Throat: No oropharyngeal exudate.  Eyes: Conjunctivae are normal. Right eye exhibits no discharge. Left eye exhibits no discharge.  Neck: Neck supple.  Cardiovascular:  Normal rate, regular rhythm and normal heart sounds.  Exam reveals no gallop and no friction rub.   No murmur heard. Pulmonary/Chest: Effort normal and breath sounds normal. She has no wheezes. She exhibits no tenderness.  Lymphadenopathy:    She has no cervical adenopathy.  Neurological: She is alert.  Skin: Skin is warm and dry. No rash noted.  Psychiatric: She has a normal mood and affect.          Assessment & Plan:

## 2013-03-28 DIAGNOSIS — J069 Acute upper respiratory infection, unspecified: Secondary | ICD-10-CM | POA: Insufficient documentation

## 2013-03-28 NOTE — Assessment & Plan Note (Signed)
She was given injections of Solu-Medrol and Toradol.   

## 2013-03-28 NOTE — Assessment & Plan Note (Signed)
She was given a prescription for [employer] along with a prescription for Zithromax to take if she worsens.

## 2013-03-28 NOTE — Assessment & Plan Note (Signed)
She was given medications to help with her symptoms.

## 2013-04-03 NOTE — Assessment & Plan Note (Signed)
Megan Petersen has lost 8 lbs since her visit last month. She will continue to work on diet and exercise.

## 2013-04-03 NOTE — Assessment & Plan Note (Signed)
Megan Petersen appears improved. She is ready to go back to work.

## 2013-04-24 ENCOUNTER — Other Ambulatory Visit: Payer: Self-pay | Admitting: Family Medicine

## 2013-04-24 DIAGNOSIS — R05 Cough: Secondary | ICD-10-CM

## 2013-04-27 ENCOUNTER — Telehealth: Payer: Self-pay | Admitting: *Deleted

## 2013-04-27 NOTE — Telephone Encounter (Signed)
Megan Petersen will come in on Thursday 04/29/13 for the f/u x-ray.  Per our conversation, you will order this for her.

## 2013-04-29 ENCOUNTER — Ambulatory Visit: Payer: BC Managed Care – PPO

## 2013-04-29 ENCOUNTER — Ambulatory Visit (HOSPITAL_BASED_OUTPATIENT_CLINIC_OR_DEPARTMENT_OTHER)
Admission: RE | Admit: 2013-04-29 | Discharge: 2013-04-29 | Disposition: A | Discharge status: Home / Self Care | Payer: BC Managed Care – PPO | Source: Ambulatory Visit | Attending: Family Medicine | Admitting: Family Medicine

## 2013-04-29 DIAGNOSIS — J189 Pneumonia, unspecified organism: Secondary | ICD-10-CM | POA: Insufficient documentation

## 2013-04-29 DIAGNOSIS — R05 Cough: Secondary | ICD-10-CM

## 2013-04-29 DIAGNOSIS — R062 Wheezing: Secondary | ICD-10-CM | POA: Insufficient documentation

## 2013-04-29 DIAGNOSIS — I10 Essential (primary) hypertension: Secondary | ICD-10-CM | POA: Insufficient documentation

## 2013-04-30 LAB — HM MAMMOGRAPHY

## 2013-05-07 ENCOUNTER — Other Ambulatory Visit: Payer: Self-pay | Admitting: *Deleted

## 2013-05-07 ENCOUNTER — Other Ambulatory Visit: Payer: BC Managed Care – PPO

## 2013-05-07 DIAGNOSIS — R5381 Other malaise: Secondary | ICD-10-CM

## 2013-05-07 DIAGNOSIS — I10 Essential (primary) hypertension: Secondary | ICD-10-CM

## 2013-05-07 DIAGNOSIS — E785 Hyperlipidemia, unspecified: Secondary | ICD-10-CM

## 2013-05-07 DIAGNOSIS — E119 Type 2 diabetes mellitus without complications: Secondary | ICD-10-CM

## 2013-05-07 DIAGNOSIS — E559 Vitamin D deficiency, unspecified: Secondary | ICD-10-CM

## 2013-05-10 ENCOUNTER — Other Ambulatory Visit: Payer: BC Managed Care – PPO

## 2013-05-10 LAB — LIPID PANEL
Cholesterol: 166 mg/dL (ref 0–200)
HDL: 34 mg/dL — ABNORMAL LOW (ref >39)
LDL Cholesterol: 100 mg/dL — ABNORMAL HIGH (ref 0–99)
Total CHOL/HDL Ratio: 4.9 Ratio
Triglycerides: 160 mg/dL — ABNORMAL HIGH (ref <150)
VLDL: 32 mg/dL (ref 0–40)

## 2013-05-10 LAB — CBC WITH DIFFERENTIAL/PLATELET
Basophils Absolute: 0 10*3/uL (ref 0.0–0.1)
Basophils Relative: 0 % (ref 0–1)
Eosinophils Absolute: 0.1 10*3/uL (ref 0.0–0.7)
Eosinophils Relative: 1 % (ref 0–5)
HCT: 41.5 % (ref 36.0–46.0)
Hemoglobin: 14.3 g/dL (ref 12.0–15.0)
Lymphocytes Relative: 34 % (ref 12–46)
Lymphs Abs: 3 10*3/uL (ref 0.7–4.0)
MCH: 27.4 pg (ref 26.0–34.0)
MCHC: 34.5 g/dL (ref 30.0–36.0)
MCV: 79.7 fL (ref 78.0–100.0)
Monocytes Absolute: 0.6 10*3/uL (ref 0.1–1.0)
Monocytes Relative: 7 % (ref 3–12)
Neutro Abs: 5.1 10*3/uL (ref 1.7–7.7)
Neutrophils Relative %: 58 % (ref 43–77)
Platelets: 364 10*3/uL (ref 150–400)
RBC: 5.21 MIL/uL — ABNORMAL HIGH (ref 3.87–5.11)
RDW: 13.2 % (ref 11.5–15.5)
WBC: 8.8 10*3/uL (ref 4.0–10.5)

## 2013-05-10 LAB — HEMOGLOBIN A1C
Hgb A1c MFr Bld: 5.8 % — ABNORMAL HIGH (ref <5.7)
Mean Plasma Glucose: 120 mg/dL — ABNORMAL HIGH (ref <117)

## 2013-05-10 LAB — COMPLETE METABOLIC PANEL WITH GFR
ALT: 12 U/L (ref 0–35)
AST: 14 U/L (ref 0–37)
Albumin: 4.1 g/dL (ref 3.5–5.2)
Alkaline Phosphatase: 80 U/L (ref 39–117)
BUN: 18 mg/dL (ref 6–23)
CO2: 28 mEq/L (ref 19–32)
Calcium: 9.6 mg/dL (ref 8.4–10.5)
Chloride: 103 mEq/L (ref 96–112)
Creat: 0.6 mg/dL (ref 0.50–1.10)
GFR, Est African American: >89 mL/min
GFR, Est Non African American: >89 mL/min
Glucose, Bld: 106 mg/dL — ABNORMAL HIGH (ref 70–99)
Potassium: 4.5 mEq/L (ref 3.5–5.3)
Sodium: 137 mEq/L (ref 135–145)
Total Bilirubin: 0.5 mg/dL (ref 0.3–1.2)
Total Protein: 6.9 g/dL (ref 6.0–8.3)

## 2013-05-11 LAB — TSH: TSH: 1.403 u[IU]/mL (ref 0.350–4.500)

## 2013-05-11 LAB — VITAMIN D 25 HYDROXY (VIT D DEFICIENCY, FRACTURES): Vit D, 25-Hydroxy: 35 ng/mL (ref 30–89)

## 2013-05-11 LAB — MICROALBUMIN, URINE: Microalb, Ur: 0.7 mg/dL (ref 0.00–1.89)

## 2013-05-14 ENCOUNTER — Encounter: Payer: Self-pay | Admitting: Family Medicine

## 2013-05-14 ENCOUNTER — Ambulatory Visit (INDEPENDENT_AMBULATORY_CARE_PROVIDER_SITE_OTHER): Payer: BC Managed Care – PPO | Admitting: Family Medicine

## 2013-05-14 VITALS — BP 112/71 | HR 64 | Resp 16 | Ht 63.5 in | Wt 243.0 lb

## 2013-05-14 DIAGNOSIS — R7301 Impaired fasting glucose: Secondary | ICD-10-CM

## 2013-05-14 DIAGNOSIS — M25569 Pain in unspecified knee: Secondary | ICD-10-CM

## 2013-05-14 DIAGNOSIS — R609 Edema, unspecified: Secondary | ICD-10-CM

## 2013-05-14 DIAGNOSIS — J45909 Unspecified asthma, uncomplicated: Secondary | ICD-10-CM

## 2013-05-14 DIAGNOSIS — I10 Essential (primary) hypertension: Secondary | ICD-10-CM

## 2013-05-14 MED ORDER — LIDOCAINE 5 % EX PTCH: 3.0000 | MEDICATED_PATCH | CUTANEOUS | Status: AC

## 2013-05-14 MED ORDER — MONTELUKAST SODIUM 10 MG PO TABS: 10.0000 mg | ORAL_TABLET | Freq: Every day | ORAL | Status: DC

## 2013-05-14 MED ORDER — VALSARTAN 80 MG PO TABS: 80.0000 mg | ORAL_TABLET | Freq: Every day | ORAL | Status: DC

## 2013-05-14 MED ORDER — BUDESONIDE-FORMOTEROL FUMARATE 160-4.5 MCG/ACT IN AERO: 2.0000 | INHALATION_SPRAY | Freq: Two times a day (BID) | RESPIRATORY_TRACT | Status: DC

## 2013-05-14 NOTE — Patient Instructions (Addendum)
1)  URI -   THE MINUTE YOU EVEN THINK YOU ARE GETTING SICK START SUCKING ON COLD EEZE UP TO 6 PER DAY PLUS ADD [facility] COLD CARE  Sinus Headache A sinus headache is when your sinuses become clogged or swollen. Sinus headaches can range from mild to severe.  CAUSES A sinus headache can have different causes, such as:  Colds.  Sinus infections.  Allergies. SYMPTOMS  Symptoms of a sinus headache may vary and can include:  Headache.  Pain or pressure in the face.  Congested or runny nose.  Fever.  Inability to smell.  Pain in upper teeth. Weather changes can make symptoms worse. TREATMENT  The treatment of a sinus headache depends on the cause.  Sinus pain caused by a sinus infection may be treated with antibiotic medicine.  Sinus pain caused by allergies may be helped by allergy medicines (antihistamines) and medicated nasal sprays.  Sinus pain caused by congestion may be helped by flushing the nose and sinuses with saline solution. HOME CARE INSTRUCTIONS   If antibiotics are prescribed, take them as directed. Finish them even if you start to feel better.  Only take over-the-counter or prescription medicines for pain, discomfort, or fever as directed by your caregiver.  If you have congestion, use a nasal spray to help reduce pressure. SEEK IMMEDIATE MEDICAL CARE IF:  You have a fever.  You have headaches more than once a week.  You have sensitivity to light or sound.  You have repeated nausea and vomiting.  You have vision problems.  You have sudden, severe pain in your face or head.  You have a seizure.  You are confused.  Your sinus headaches do not get better after treatment. Many people think they have a sinus headache when they actually have migraines or tension headaches. MAKE SURE YOU:   Understand these instructions.  Will watch your condition.  Will get help right away if you are not doing well or get worse. Document Released: 09/19/2004  Document Revised: 11/04/2011 Document Reviewed: 11/10/2010 [facility] Patient Information 2014 [place], [place].

## 2013-05-14 NOTE — Progress Notes (Signed)
Subjective:    Patient ID: Megan Petersen, female    DOB: 12/29/1960, 52 y.o.   MRN: [id number]  HPI  Megan Petersen is here today to go over her most recent lab results, to discuss the conditions listed below and to get some of her medications refilled. She also has a red area on her right elbow she would like checked.   1)  Hypertension: She is doing well with the combination of Diovan and Bystolic.  2)  Edema: She is taking Lasix.  3)  Impaired Fasting Glucose: Her A1C is 5.8. She has started back on Weight Watchers hoping to improve her sugars.   Review of Systems  Constitutional: Negative.   HENT: Negative.   Eyes: Negative.   Respiratory: Negative.   Cardiovascular: Negative.   Gastrointestinal: Negative.   Endocrine: Negative.   Genitourinary: Negative.   Musculoskeletal: Negative.   Skin:       She has a red area on her right elbow  Allergic/Immunologic: Negative.   Neurological: Negative.   Hematological: Negative.   Psychiatric/Behavioral: Negative.      Past Medical History  Diagnosis Date  . Allergy   . Hypertension   . Hyperlipidemia   . Asthma   . GERD (gastroesophageal reflux disease)   . Morbid obesity   . Osteoarthritis   . Rosacea   . Trigeminal neuralgia   . LSIL (low grade squamous intraepithelial lesion) on Pap smear   . Atypical glandular cells on Pap smear   . Primary cough headache   . Osteoarthritis   . Esophageal reflux   . Impaired fasting glucose   . Swelling of limb   . Headache       Family History  Problem Relation Age of Onset  . Heart disease Mother   . Hypertension Mother   . Cancer Mother     Ovarian  . Drug abuse Sister   . Hypertension Sister   . Diabetes Sister   . Drug abuse Brother   . Hypertension Brother   . Diabetes Brother       History   Social History Narrative   Marital Status: Married Geneticist, molecular)   Children:  Sons Jeri Modena, [place] [Lucas]); Daughter Leotis Shames)    Living Situation:  Lives with spouse and  children.   Occupation: Chief Financial Officer)   Education:  Engineer, agricultural   Tobacco Use/Exposure:  None    Alcohol Use: None   Drug Use:  None   Diet:  Regular   Exercise:  Minimal   Hobbies:  Reading, Traveling, Movies, Knitting)                Objective:   Physical Exam  Vitals reviewed. Constitutional: She is oriented to person, place, and time. She appears well-developed and well-nourished. No distress.  HENT:  Head: Normocephalic.  Eyes: No scleral icterus.  Neck: No thyromegaly present.  Cardiovascular: Normal rate, regular rhythm and normal heart sounds.   Pulmonary/Chest: Effort normal and breath sounds normal.  Abdominal: There is no tenderness.  Musculoskeletal: She exhibits edema (Mild swelling of legs.  ).  Neurological: She is alert and oriented to person, place, and time.  Skin: Skin is warm and dry.  Psychiatric: She has a normal mood and affect. Her behavior is normal. Judgment and thought content normal.      Assessment & Plan:    Kimyatta was seen today for medication management.  Diagnoses and associated orders for this visit:  Essential hypertension, benign - valsartan (DIOVAN) 80 MG  tablet; Take 1 tablet (80 mg total) by mouth daily.  Impaired fasting glucose  Edema  Unspecified asthma(493.90) - montelukast (SINGULAIR) 10 MG tablet; Take 1 tablet (10 mg total) by mouth at bedtime. - budesonide-formoterol (SYMBICORT) 160-4.5 MCG/ACT inhaler; Inhale 2 puffs into the lungs 2 (two) times daily.  Knee pain, unspecified laterality - lidocaine (LIDODERM) 5 %; Place 3 patches onto the skin daily. Remove & Discard patch within 12 hours or as directed by MD

## 2013-05-31 ENCOUNTER — Encounter: Payer: Self-pay | Admitting: Family Medicine

## 2013-06-08 ENCOUNTER — Other Ambulatory Visit: Payer: Self-pay | Admitting: Family Medicine

## 2013-07-17 ENCOUNTER — Other Ambulatory Visit: Payer: Self-pay | Admitting: Family Medicine

## 2013-07-26 ENCOUNTER — Ambulatory Visit (INDEPENDENT_AMBULATORY_CARE_PROVIDER_SITE_OTHER): Payer: BC Managed Care – PPO | Admitting: Family Medicine

## 2013-07-26 ENCOUNTER — Encounter: Payer: Self-pay | Admitting: Family Medicine

## 2013-07-26 ENCOUNTER — Encounter (INDEPENDENT_AMBULATORY_CARE_PROVIDER_SITE_OTHER): Payer: Self-pay

## 2013-07-26 VITALS — BP 132/79 | HR 94 | Resp 18 | Wt 242.0 lb

## 2013-07-26 DIAGNOSIS — I10 Essential (primary) hypertension: Secondary | ICD-10-CM | POA: Insufficient documentation

## 2013-07-26 DIAGNOSIS — R609 Edema, unspecified: Secondary | ICD-10-CM | POA: Insufficient documentation

## 2013-07-26 DIAGNOSIS — R7301 Impaired fasting glucose: Secondary | ICD-10-CM | POA: Insufficient documentation

## 2013-07-26 DIAGNOSIS — M549 Dorsalgia, unspecified: Secondary | ICD-10-CM | POA: Insufficient documentation

## 2013-07-26 MED ORDER — TRAMADOL HCL 50 MG PO TABS: 50.0000 mg | ORAL_TABLET | Freq: Four times a day (QID) | ORAL | Status: DC | PRN

## 2013-07-26 MED ORDER — METHYLPREDNISOLONE SODIUM SUCC 125 MG IJ SOLR
125.0000 mg | Freq: Once | INTRAMUSCULAR | Status: AC
  Administered 2013-07-26: 125 mg via INTRAMUSCULAR

## 2013-07-26 MED ORDER — METHYLPREDNISOLONE (PAK) 4 MG PO TABS: ORAL_TABLET | ORAL | Status: DC

## 2013-07-26 MED ORDER — KETOROLAC TROMETHAMINE 60 MG/2ML IM SOLN
60.0000 mg | Freq: Once | INTRAMUSCULAR | Status: AC
  Administered 2013-07-26: 60 mg via INTRAMUSCULAR

## 2013-07-26 NOTE — Assessment & Plan Note (Signed)
She received injections of Toradol and Solu-Medrol along with prescriptions for her pain.  She was also encouraged to get a chiropractic adjustment with Dr. Luisa Dago or Dr. Christell Faith.  If she does not improve we can consider some PT.  We'll proceed to imaging if she does not improve.

## 2013-07-26 NOTE — Patient Instructions (Signed)
1)  Back Pain - You received an injection of a steroid (Solu-Medrol) and an anti-inflammatory (Toradol).  Take a combination of Indomethacin/Tylenol 1000 mg (3 x per day) and Ultram up to 4 per day.  If you improve after the shot then worsen, you can try the Medrol Dose Pak.  I would recommend a chiropractic adjustment with Dr. Luisa Dago or Dr. Christell Faith.  If you don't improve, we'll try some PT.  Consider x-ray +/- MRI.    Back Pain, Adult Low back pain is very common. About 1 in 5 people have back pain.The cause of low back pain is rarely dangerous. The pain often gets better over time.About half of people with a sudden onset of back pain feel better in just 2 weeks. About 8 in 10 people feel better by 6 weeks.  CAUSES Some common causes of back pain include:  Strain of the muscles or ligaments supporting the spine.  Wear and tear (degeneration) of the spinal discs.  Arthritis.  Direct injury to the back. DIAGNOSIS Most of the time, the direct cause of low back pain is not known.However, back pain can be treated effectively even when the exact cause of the pain is unknown.Answering your caregiver's questions about your overall health and symptoms is one of the most accurate ways to make sure the cause of your pain is not dangerous. If your caregiver needs more information, he or she may order lab work or imaging tests (X-rays or MRIs).However, even if imaging tests show changes in your back, this usually does not require surgery. HOME CARE INSTRUCTIONS For many people, back pain returns.Since low back pain is rarely dangerous, it is often a condition that people can learn to [facility] their own.   Remain active. It is stressful on the back to sit or stand in one place. Do not sit, drive, or stand in one place for more than 30 minutes at a time. Take short walks on level surfaces as soon as pain allows.Try to increase the length of time you walk each day.  Do not stay in bed.Resting more than  1 or 2 days can delay your recovery.  Do not avoid exercise or work.Your body is made to move.It is not dangerous to be active, even though your back may hurt.Your back will likely heal faster if you return to being active before your pain is gone.  Pay attention to your body when you bend and lift. Many people have less discomfortwhen lifting if they bend their knees, keep the load close to their bodies,and avoid twisting. Often, the most comfortable positions are those that put less stress on your recovering back.  Find a comfortable position to sleep. Use a firm mattress and lie on your side with your knees slightly bent. If you lie on your back, put a pillow under your knees.  Only take over-the-counter or prescription medicines as directed by your caregiver. Over-the-counter medicines to reduce pain and inflammation are often the most helpful.Your caregiver may prescribe muscle relaxant drugs.These medicines help dull your pain so you can more quickly return to your normal activities and healthy exercise.  Put ice on the injured area.  Put ice in a plastic bag.  Place a towel between your skin and the bag.  Leave the ice on for 15-20 minutes, 03-04 times a day for the first 2 to 3 days. After that, ice and heat may be alternated to reduce pain and spasms.  Ask your caregiver about trying back exercises and  gentle massage. This may be of some benefit.  Avoid feeling anxious or stressed.Stress increases muscle tension and can worsen back pain.It is important to recognize when you are anxious or stressed and learn ways to manage it.Exercise is a great option. SEEK MEDICAL CARE IF:  You have pain that is not relieved with rest or medicine.  You have pain that does not improve in 1 week.  You have new symptoms.  You are generally not feeling well. SEEK IMMEDIATE MEDICAL CARE IF:   You have pain that radiates from your back into your legs.  You develop new bowel or bladder  control problems.  You have unusual weakness or numbness in your arms or legs.  You develop nausea or vomiting.  You develop abdominal pain.  You feel faint. Document Released: 08/12/2005 Document Revised: 02/11/2012 Document Reviewed: 12/31/2010 [facility] Patient Information 2014 [place], [place].

## 2013-07-26 NOTE — Progress Notes (Signed)
Subjective:    Patient ID: Megan Petersen, female    DOB: 19-Jul-1961, 52 y.o.   MRN: [id number]  HPI  Megan Petersen is here today complaining of back pain and numbness in her left toes.  She went to [facility] in [place] on Friday. She was given Percocet and told to continue the Flexeril. She was also given a shot of Dilaudid and Norflex in the office. The injections only made her sleep. The Percocet helps a little. She feels better standing. She was told that she might have a pinch nerve. She was told to follow up with her PCP today. She is not taking the Percocet right now because she is working and it's too strong.   Review of Systems  Constitutional: Negative.   HENT: Negative.   Eyes: Negative.   Respiratory: Negative.   Cardiovascular: Negative.   Gastrointestinal: Negative.   Endocrine: Negative.   Genitourinary: Negative for dysuria and frequency.  Musculoskeletal: Positive for back pain.  Skin: Negative.   Allergic/Immunologic: Negative.   Neurological: Positive for numbness. Negative for weakness.  Hematological: Negative.   Psychiatric/Behavioral: Negative.      Past Medical History  Diagnosis Date  . Allergy   . Hypertension   . Hyperlipidemia   . Asthma   . GERD (gastroesophageal reflux disease)   . Morbid obesity   . Osteoarthritis   . Rosacea   . Trigeminal neuralgia   . LSIL (low grade squamous intraepithelial lesion) on Pap smear   . Atypical glandular cells on Pap smear   . Primary cough headache   . Osteoarthritis   . Esophageal reflux   . Impaired fasting glucose   . Swelling of limb   . Headache       Family History  Problem Relation Age of Onset  . Heart disease Mother   . Hypertension Mother   . Cancer Mother     Ovarian  . Drug abuse Sister   . Hypertension Sister   . Diabetes Sister   . Drug abuse Brother   . Hypertension Brother   . Diabetes Brother       History   Social History Narrative   Marital Status: Married Geneticist, molecular)   Children:  Sons Jeri Modena, [place] [Lucas]); Daughter Leotis Shames)    Living Situation:  Lives with spouse and children.   Occupation: Chief Financial Officer)   Education:  Engineer, agricultural   Tobacco Use/Exposure:  None    Alcohol Use: None   Drug Use:  None   Diet:  Regular   Exercise:  Minimal   Hobbies:  Reading, Traveling, Movies, Knitting)                  Objective:   Physical Exam  Nursing note and vitals reviewed. Constitutional: She is oriented to person, place, and time. She appears well-developed and well-nourished.  Eyes: No scleral icterus.  Neck: Neck supple.  Cardiovascular: Normal rate and regular rhythm.   Pulmonary/Chest: Effort normal and breath sounds normal.  Musculoskeletal: She exhibits tenderness (left lower back ). She exhibits no edema.       Lumbar back: She exhibits decreased range of motion, tenderness and pain.  Difficulty walking/ Sitting  Neurological: She is alert and oriented to person, place, and time. She displays normal reflexes. She exhibits normal muscle tone. Coordination normal.  Skin: Skin is warm and dry. No rash noted.  Psychiatric: She has a normal mood and affect. Her speech is normal and behavior is normal. Judgment and thought  content normal.          Assessment & Plan:

## 2013-07-29 ENCOUNTER — Encounter: Payer: Self-pay | Admitting: *Deleted

## 2013-08-13 ENCOUNTER — Ambulatory Visit (INDEPENDENT_AMBULATORY_CARE_PROVIDER_SITE_OTHER): Payer: BC Managed Care – PPO | Admitting: Family Medicine

## 2013-08-13 ENCOUNTER — Encounter: Payer: Self-pay | Admitting: Family Medicine

## 2013-08-13 ENCOUNTER — Telehealth: Payer: Self-pay | Admitting: *Deleted

## 2013-08-13 VITALS — BP 131/81 | HR 81 | Resp 16 | Ht 63.5 in | Wt 236.0 lb

## 2013-08-13 DIAGNOSIS — M549 Dorsalgia, unspecified: Secondary | ICD-10-CM

## 2013-08-13 MED ORDER — KETOROLAC TROMETHAMINE 60 MG/2ML IM SOLN
60.0000 mg | Freq: Once | INTRAMUSCULAR | Status: AC
  Administered 2013-08-13: 60 mg via INTRAMUSCULAR

## 2013-08-13 MED ORDER — METHYLPREDNISOLONE (PAK) 4 MG PO TABS: ORAL_TABLET | ORAL | Status: DC

## 2013-08-13 MED ORDER — METHYLPREDNISOLONE SODIUM SUCC 125 MG IJ SOLR
125.0000 mg | Freq: Once | INTRAMUSCULAR | Status: AC
  Administered 2013-08-13: 125 mg via INTRAMUSCULAR

## 2013-08-13 NOTE — Patient Instructions (Signed)
1)  Back Pain - You received injections of Solu-Medrol and Toradol. Consider getting a chiropractic adjustment.  We'll check into getting you a MRI.  You may need to have PT before the MRI.                Lumbosacral Strain Lumbosacral strain is one of the most common causes of back pain. There are many causes of back pain. Most are not serious conditions. CAUSES  Your backbone (spinal column) is made up of 24 main vertebral bodies, the sacrum, and the coccyx. These are held together by muscles and tough, fibrous tissue (ligaments). Nerve roots pass through the openings between the vertebrae. A sudden move or injury to the back may cause injury to, or pressure on, these nerves. This may result in localized back pain or pain movement (radiation) into the buttocks, down the leg, and into the foot. Sharp, shooting pain from the buttock down the back of the leg (sciatica) is frequently associated with a ruptured (herniated) disk. Pain may be caused by muscle spasm alone. Your caregiver can often find the cause of your pain by the details of your symptoms and an exam. In some cases, you may need tests (such as X-rays). Your caregiver will work with you to decide if any tests are needed based on your specific exam. HOME CARE INSTRUCTIONS   Avoid an underactive lifestyle. Active exercise, as directed by your caregiver, is your greatest weapon against back pain.  Avoid hard physical activities (tennis, racquetball, waterskiing) if you are not in proper physical condition for it. This may aggravate or create problems.  If you have a back problem, avoid sports requiring sudden body movements. Swimming and walking are generally safer activities.  Maintain good posture.  Avoid becoming overweight (obese).  Use bed rest for only the most extreme, sudden (acute) episode. Your caregiver will help you determine how much bed rest is necessary.  For acute conditions, you may put ice on the injured area.  Put  ice in a plastic bag.  Place a towel between your skin and the bag.  Leave the ice on for 15-20 minutes at a time, every 2 hours, or as needed.  After you are improved and more active, it may help to apply heat for 30 minutes before activities. See your caregiver if you are having pain that lasts longer than expected. Your caregiver can advise appropriate exercises or therapy if needed. With conditioning, most back problems can be avoided. SEEK IMMEDIATE MEDICAL CARE IF:   You have numbness, tingling, weakness, or problems with the use of your arms or legs.  You experience severe back pain not relieved with medicines.  There is a change in bowel or bladder control.  You have increasing pain in any area of the body, including your belly (abdomen).  You notice shortness of breath, dizziness, or feel faint.  You feel sick to your stomach (nauseous), are throwing up (vomiting), or become sweaty.  You notice discoloration of your toes or legs, or your feet get very cold.  Your back pain is getting worse.  You have a fever. MAKE SURE YOU:   Understand these instructions.  Will watch your condition.  Will get help right away if you are not doing well or get worse. Document Released: 05/22/2005 Document Revised: 11/04/2011 Document Reviewed: 11/11/2008 [facility] Patient Information 2014 [place], [place].

## 2013-08-13 NOTE — Progress Notes (Signed)
   Subjective:    Patient ID: Megan Petersen, female    DOB: Oct 18, 1960, 52 y.o.   MRN: [id number]  HPI  Megan Petersen is here today complaining of back pain that radiates down her left leg and into her toes.  She has been struggling with this pain intermittently for about a month. She was seen at the [facility] ED in [place] and says that she was given Percocet and Dilaudid at the ED and was sent home with a prescription for Flexeril.  She has taken several medications for this problem with little relief.  She did well with previous back pain after she received injections of Solu-Medrol and Toradol in our office and would like to have these again to give her relief over the holidays.  She also did well in the past with chiropractic adjustments and is willing to try them again as well.       Review of Systems  Musculoskeletal: Positive for back pain.       Radiates to her leg.   Neurological: Positive for numbness.       Left leg and toes        Objective:   Physical Exam  Constitutional: She appears well-nourished. No distress (She is uncomfortale but does not appear to be distresed.  ).  Neck: Neck supple.  Cardiovascular: Normal rate and regular rhythm.   Pulmonary/Chest: Effort normal and breath sounds normal.  Musculoskeletal: She exhibits tenderness.       Lumbar back: She exhibits tenderness and spasm. She exhibits no swelling.       Back:      Assessment & Plan:   Megan Petersen was seen today for back pain.  Diagnoses and associated orders for this visit:  Back pain Comments: She received injections of Solu-Medrol and Toradol along with a prescription for a Medrol Dose Pak.  We are setting her up for a MRI of the lumbar spine.  She was also given Dr. Luisa Dago and Dr. Herbert Moors numbers in case she decides to go for a chiropractic adjustment.   - methylPREDNIsolone (MEDROL DOSPACK) 4 MG tablet; follow package directions - MR Lumbar Spine Wo Contrast; Future - methylPREDNISolone sodium  succinate (SOLU-MEDROL) 125 mg/2 mL injection 125 mg; Inject 2 mLs (125 mg total) into the muscle once. - ketorolac (TORADOL) injection 60 mg; Inject 2 mLs (60 mg total) into the muscle once.

## 2013-08-13 NOTE — Telephone Encounter (Signed)
Per the automated system:  No prior authorization is not required for MRI.  Confirmation # [id number]

## 2013-08-14 ENCOUNTER — Ambulatory Visit (HOSPITAL_BASED_OUTPATIENT_CLINIC_OR_DEPARTMENT_OTHER)
Admission: RE | Admit: 2013-08-14 | Discharge: 2013-08-14 | Disposition: A | Discharge status: Home / Self Care | Payer: BC Managed Care – PPO | Source: Ambulatory Visit | Attending: Family Medicine | Admitting: Family Medicine

## 2013-08-14 DIAGNOSIS — R209 Unspecified disturbances of skin sensation: Secondary | ICD-10-CM | POA: Insufficient documentation

## 2013-08-14 DIAGNOSIS — M545 Low back pain, unspecified: Secondary | ICD-10-CM | POA: Insufficient documentation

## 2013-08-14 DIAGNOSIS — Q619 Cystic kidney disease, unspecified: Secondary | ICD-10-CM | POA: Insufficient documentation

## 2013-08-14 DIAGNOSIS — M539 Dorsopathy, unspecified: Secondary | ICD-10-CM | POA: Insufficient documentation

## 2013-08-14 DIAGNOSIS — M47817 Spondylosis without myelopathy or radiculopathy, lumbosacral region: Secondary | ICD-10-CM | POA: Insufficient documentation

## 2013-08-14 DIAGNOSIS — M48061 Spinal stenosis, lumbar region without neurogenic claudication: Secondary | ICD-10-CM | POA: Insufficient documentation

## 2013-08-14 DIAGNOSIS — M5126 Other intervertebral disc displacement, lumbar region: Secondary | ICD-10-CM | POA: Insufficient documentation

## 2013-08-14 DIAGNOSIS — M549 Dorsalgia, unspecified: Secondary | ICD-10-CM

## 2013-08-17 ENCOUNTER — Encounter: Payer: Self-pay | Admitting: Family Medicine

## 2013-08-27 ENCOUNTER — Other Ambulatory Visit: Payer: Self-pay | Admitting: Family Medicine

## 2013-09-16 ENCOUNTER — Encounter: Payer: Self-pay | Admitting: Family Medicine

## 2013-09-18 ENCOUNTER — Other Ambulatory Visit: Payer: Self-pay | Admitting: Family Medicine

## 2013-09-20 ENCOUNTER — Encounter: Payer: Self-pay | Admitting: Family Medicine

## 2013-09-20 ENCOUNTER — Ambulatory Visit (INDEPENDENT_AMBULATORY_CARE_PROVIDER_SITE_OTHER): Payer: BC Managed Care – PPO | Admitting: Family Medicine

## 2013-09-20 VITALS — BP 139/62 | HR 82 | Resp 16 | Ht 62.5 in | Wt 234.0 lb

## 2013-09-20 DIAGNOSIS — M549 Dorsalgia, unspecified: Secondary | ICD-10-CM

## 2013-09-20 DIAGNOSIS — M7989 Other specified soft tissue disorders: Secondary | ICD-10-CM

## 2013-09-20 DIAGNOSIS — K219 Gastro-esophageal reflux disease without esophagitis: Secondary | ICD-10-CM

## 2013-09-20 DIAGNOSIS — R51 Headache: Secondary | ICD-10-CM

## 2013-09-20 DIAGNOSIS — J45909 Unspecified asthma, uncomplicated: Secondary | ICD-10-CM

## 2013-09-20 MED ORDER — FUROSEMIDE 40 MG PO TABS: 40.0000 mg | ORAL_TABLET | Freq: Every day | ORAL | Status: DC

## 2013-09-20 MED ORDER — METAXALONE 800 MG PO TABS: 800.0000 mg | ORAL_TABLET | Freq: Three times a day (TID) | ORAL | Status: AC

## 2013-09-20 MED ORDER — ESOMEPRAZOLE MAGNESIUM 40 MG PO CPDR: 40.0000 mg | DELAYED_RELEASE_CAPSULE | Freq: Every day | ORAL | Status: DC

## 2013-09-20 MED ORDER — CYCLOBENZAPRINE HCL 10 MG PO TABS: 10.0000 mg | ORAL_TABLET | Freq: Every day | ORAL | Status: AC

## 2013-09-20 MED ORDER — ALBUTEROL SULFATE HFA 108 (90 BASE) MCG/ACT IN AERS: 2.0000 | INHALATION_SPRAY | Freq: Four times a day (QID) | RESPIRATORY_TRACT | Status: DC | PRN

## 2013-09-20 MED ORDER — INDOMETHACIN ER 75 MG PO CPCR: 75.0000 mg | ORAL_CAPSULE | Freq: Two times a day (BID) | ORAL | Status: AC

## 2013-09-20 NOTE — Progress Notes (Addendum)
Subjective:    Patient ID: Megan Petersen, female    DOB: [birth year], 53 y.o.   MRN: [id number]  HPI  [name] is here today for a couple of issues.  She needs to have several of her medications refilled.  She also needs to have forms completed for her short term disability and she needs her FMLA forms completed.  She was off work from Monday 08/23/13 until Friday 1/2/1 back pain .  She returned to work on 08/30/2013.   1)  Back Pain -  She was out of work from Monday 08/23/13 until Friday 08/27/13 and needs to have disability forms completed.  She also needs to have her Skelaxin and Flexeril refilled.    2)  Vascular Headaches -  She continues taking her Indomethacin daily. She needs a refill on it.    3)  Edema - She needs a refill on her Lasix.    4)  Asthma - She needs a refill on her Symbicort.    Review of Systems  Respiratory: Negative for shortness of breath.   Cardiovascular: Negative for leg swelling.  Musculoskeletal: Positive for back pain (It has improved since receiving the injection.  ).       Knees  Neurological: Negative for light-headedness.     Past Medical History  Diagnosis Date  . Allergy   . Hypertension   . Hyperlipidemia   . Asthma   . GERD (gastroesophageal reflux disease)   . Morbid obesity   . Osteoarthritis   . Rosacea   . Trigeminal neuralgia   . LSIL (low grade squamous intraepithelial lesion) on Pap smear   . Atypical glandular cells on Pap smear   . Primary cough headache   . Osteoarthritis   . Esophageal reflux   . Impaired fasting glucose   . Swelling of limb   . Headache      Past Surgical History  Procedure Laterality Date  . Cholecystectomy    . Leep       History   Social History Narrative   Marital Status: Married Geneticist, molecular)   Children:  Sons Jeri Modena, [place] [Lucas]); Daughter Leotis Shames)    Living Situation:  Lives with spouse and children.   Occupation: Chief Financial Officer)   Education:  Engineer, agricultural   Tobacco  Use/Exposure:  None    Alcohol Use: None   Drug Use:  None   Diet:  Regular   Exercise:  Minimal   Hobbies:  Reading, Traveling, Movies, Knitting)               Family History  Problem Relation Age of Onset  . Heart disease Mother   . Hypertension Mother   . Cancer Mother     Ovarian  . Drug abuse Sister   . Hypertension Sister   . Diabetes Sister   . Drug abuse Brother   . Hypertension Brother   . Diabetes Brother   . Cancer Father     Lymphoma     Current Outpatient Prescriptions on File Prior to Visit  Medication Sig Dispense Refill  . budesonide-formoterol (SYMBICORT) 160-4.5 MCG/ACT inhaler Inhale 2 puffs into the lungs 2 (two) times daily.  1 Inhaler  11  . celecoxib (CELEBREX) 200 MG capsule Take 1 capsule (200 mg total) by mouth 2 (two) times daily.  60 capsule  2  . lidocaine (LIDODERM) 5 % Place 3 patches onto the skin daily. Remove & Discard patch within 12 hours or as directed by MD  90  patch  11  . montelukast (SINGULAIR) 10 MG tablet Take 1 tablet (10 mg total) by mouth at bedtime.  30 tablet  11  . valsartan (DIOVAN) 80 MG tablet Take 1 tablet (80 mg total) by mouth daily.  30 tablet  11  . diclofenac sodium (VOLTAREN) 1 % GEL Apply topically.      . traMADol (ULTRAM) 50 MG tablet Take 1 tablet (50 mg total) by mouth every 6 (six) hours as needed for moderate pain.  120 tablet  3   No current facility-administered medications on file prior to visit.     Allergies  Allergen Reactions  . Ace Inhibitors Cough  . Demerol [Meperidine] Nausea And Vomiting  . Erythromycin Nausea And Vomiting  . Lyrica [Pregabalin] Other (See Comments)    Blisters   . Codeine Rash     Immunization History  Administered Date(s) Administered  . Pneumococcal Polysaccharide-23 03/23/2009  . Tdap 09/01/2008  . Zoster 03/20/2012      Objective:   Physical Exam  Vitals reviewed. Constitutional: She is oriented to person, place, and time.  Eyes: Conjunctivae are normal.  No scleral icterus.  Neck: Neck supple. No thyromegaly present.  Cardiovascular: Normal rate, regular rhythm and normal heart sounds.   Pulmonary/Chest: Effort normal and breath sounds normal.  Musculoskeletal: She exhibits no edema and no tenderness.  Lymphadenopathy:    She has no cervical adenopathy.  Neurological: She is alert and oriented to person, place, and time.  Skin: Skin is warm and dry.  Psychiatric: She has a normal mood and affect. Her behavior is normal. Judgment and thought content normal.      Assessment & Plan:    [name] was seen today for medication management and form completion.  Diagnoses and associated orders for this visit:  Unspecified asthma(493.90) - albuterol (PROAIR HFA) 108 (90 BASE) MCG/ACT inhaler; Inhale 2 puffs into the lungs every 6 (six) hours as needed for wheezing or shortness of breath.  Back pain Comments: She was last seen in our office for her back pain on 07/26/2013 and 08/13/13. She had a MRI of her back on 08/14/13 and was then scheduled with the neurosurgey office to receive injections.  She went to work the week following her 12/19 visit but was out of work the following week.  She received her back injection on 08/25/13.  Disability forms were done for the week of December 29 th 2014.    - cyclobenzaprine (FLEXERIL) 10 MG tablet; Take 1 tablet (10 mg total) by mouth at bedtime. - metaxalone (SKELAXIN) 800 MG tablet; Take 1 tablet (800 mg total) by mouth 3 (three) times daily.  GERD (gastroesophageal reflux disease) - esomeprazole (NEXIUM) 40 MG capsule; Take 1 capsule (40 mg total) by mouth daily before breakfast.  Swelling of limb - furosemide (LASIX) 40 MG tablet; Take 1 tablet (40 mg total) by mouth daily.  Headache(784.0) - indomethacin (INDOCIN SR) 75 MG CR capsule; Take 1 capsule (75 mg total) by mouth 2 (two) times daily with a meal.  TIME SPENT "FACE TO FACE" WITH PATIENT -  30 MINS

## 2013-09-23 ENCOUNTER — Encounter: Payer: Self-pay | Admitting: Family Medicine

## 2013-09-27 ENCOUNTER — Encounter: Payer: Self-pay | Admitting: Family Medicine

## 2013-09-27 ENCOUNTER — Ambulatory Visit (INDEPENDENT_AMBULATORY_CARE_PROVIDER_SITE_OTHER): Payer: BC Managed Care – PPO | Admitting: Family Medicine

## 2013-09-27 VITALS — BP 141/92 | HR 72 | Resp 16 | Wt 234.0 lb

## 2013-09-27 DIAGNOSIS — L03211 Cellulitis of face: Secondary | ICD-10-CM

## 2013-09-27 DIAGNOSIS — B373 Candidiasis of vulva and vagina: Secondary | ICD-10-CM

## 2013-09-27 DIAGNOSIS — B3731 Acute candidiasis of vulva and vagina: Secondary | ICD-10-CM

## 2013-09-27 DIAGNOSIS — L0201 Cutaneous abscess of face: Secondary | ICD-10-CM

## 2013-09-27 MED ORDER — AMOXICILLIN-POT CLAVULANATE ER 1000-62.5 MG PO TB12: 2.0000 | ORAL_TABLET | Freq: Two times a day (BID) | ORAL | Status: AC

## 2013-09-27 MED ORDER — FLUCONAZOLE 200 MG PO TABS: ORAL_TABLET | ORAL | Status: DC

## 2013-09-27 MED ORDER — TERCONAZOLE 0.8 % VA CREA: 1.0000 | TOPICAL_CREAM | Freq: Every day | VAGINAL | Status: DC

## 2013-09-27 NOTE — Progress Notes (Signed)
Subjective:    Patient ID: Megan Petersen, female    DOB: [birth year], 53 y.o.   MRN: [id number]   HPI  Megan LoserRhonda is here today to have her nose evaluated.  She developed an abscess last week on 09/22/12.  She was concerned with this problem and [facility].  She said that the ED doctor used a lancet to drain the abscess but he only saw a small amount of pus and blood.  They did not culture it.  She is concerned because she does not feel that she is getting much better.    Review of Systems  Constitutional: Negative for fever.  HENT: Positive for facial swelling. Negative for congestion, nosebleeds, postnasal drip, rhinorrhea and sinus pressure.   Skin:       Redness and swelling in her nose.     Past Medical History  Diagnosis Date  . Allergy   . Hypertension   . Hyperlipidemia   . Asthma   . GERD (gastroesophageal reflux disease)   . Morbid obesity   . Osteoarthritis   . Rosacea   . Trigeminal neuralgia   . LSIL (low grade squamous intraepithelial lesion) on Pap smear   . Atypical glandular cells on Pap smear   . Primary cough headache   . Osteoarthritis   . Esophageal reflux   . Impaired fasting glucose   . Swelling of limb   . Headache      Past Surgical History  Procedure Laterality Date  . Cholecystectomy    . Leep       History   Social History Narrative   Marital Status: Married Geneticist, molecular(Pat)   Children:  Sons Megan Petersen(Megan Petersen, Megan Petersen [Megan Petersen]); Daughter Megan Petersen(Megan Petersen)    Living Situation:  Lives with spouse and children.   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 Heart disease Mother   . Hypertension Mother   . Cancer Mother     Ovarian  . Drug abuse Sister   . Hypertension Sister   . Diabetes Sister   .  Drug abuse Brother   . Hypertension Brother   . Diabetes Brother   . Cancer Father     Lymphoma     Current Outpatient Prescriptions on File Prior to Visit  Medication Sig Dispense Refill  . albuterol (PROAIR HFA) 108 (90 BASE) MCG/ACT inhaler Inhale 2 puffs into the lungs every 6 (six) hours as needed for wheezing or shortness of breath.  1 Inhaler  11  . budesonide-formoterol (SYMBICORT) 160-4.5 MCG/ACT inhaler Inhale 2 puffs into the lungs 2 (two) times daily.  1 Inhaler  11  . celecoxib (CELEBREX) 200 MG capsule Take 1 capsule (200 mg total) by mouth 2 (two) times daily.  60 capsule  2  . cyclobenzaprine (FLEXERIL) 10 MG tablet Take 1 tablet (10 mg total) by mouth at bedtime.  30 tablet  11  . diclofenac sodium (VOLTAREN) 1 % GEL Apply topically.      Marland Kitchen. esomeprazole (NEXIUM) 40 MG capsule Take 1 capsule (40 mg total) by mouth daily before breakfast.  30 capsule  11  . furosemide (LASIX) 40 MG tablet Take  1 tablet (40 mg total) by mouth daily.  30 tablet  11  . indomethacin (INDOCIN SR) 75 MG CR capsule Take 1 capsule (75 mg total) by mouth 2 (two) times daily with a meal.  60 capsule  11  . lidocaine (LIDODERM) 5 % Place 3 patches onto the skin daily. Remove & Discard patch within 12 hours or as directed by MD  90 patch  11  . metaxalone (SKELAXIN) 800 MG tablet Take 1 tablet (800 mg total) by mouth 3 (three) times daily.  90 tablet  5  . montelukast (SINGULAIR) 10 MG tablet Take 1 tablet (10 mg total) by mouth at bedtime.  30 tablet  11  . traMADol (ULTRAM) 50 MG tablet Take 1 tablet (50 mg total) by mouth every 6 (six) hours as needed for moderate pain.  120 tablet  3  . valsartan (DIOVAN) 80 MG tablet Take 1 tablet (80 mg total) by mouth daily.  30 tablet  11   No current facility-administered medications on file prior to visit.     Allergies  Allergen Reactions  . Ace Inhibitors Cough  . Demerol [Meperidine] Nausea And Vomiting  . Erythromycin Nausea And Vomiting  . Lyrica  [Pregabalin] Other (See Comments)    Blisters   . Codeine Rash     Immunization History  Administered Date(s) Administered  . Pneumococcal Polysaccharide-23 03/23/2009  . Tdap 09/01/2008  . Zoster 03/20/2012        Objective:   Physical Exam  Constitutional: She appears well-nourished. No distress.  HENT:  Head: Normocephalic.    Mouth/Throat: No oropharyngeal exudate.  Eyes: Conjunctivae are normal. Right eye exhibits no discharge. Left eye exhibits no discharge.  Neck: Neck supple.  Cardiovascular: Normal rate, regular rhythm and normal heart sounds.  Exam reveals no gallop and no friction rub.   No murmur heard. Pulmonary/Chest: Effort normal and breath sounds normal. She has no wheezes. She exhibits no tenderness.  Lymphadenopathy:    She has no cervical adenopathy.  Neurological: She is alert.  Skin: Skin is warm and dry. No rash noted.  Psychiatric: She has a normal mood and affect.       Assessment & Plan:   [name] was seen today for abscess.  Diagnoses and associated orders for this visit:  Cellulitis and abscess of face - amoxicillin-clavulanate (AUGMENTIN XR) 1000-62.5 MG per tablet; Take 2 tablets by mouth 2 (two) times daily.  Candidiasis of genitalia in female - fluconazole (DIFLUCAN) 200 MG tablet; Take 1 tab po QOD for 7 days - terconazole (TERAZOL 3) 0.8 % vaginal cream; Place 1 applicator vaginally at bedtime.

## 2013-11-03 ENCOUNTER — Encounter: Payer: Self-pay | Admitting: Family Medicine

## 2013-11-17 ENCOUNTER — Ambulatory Visit (INDEPENDENT_AMBULATORY_CARE_PROVIDER_SITE_OTHER): Payer: BC Managed Care – PPO | Admitting: Family Medicine

## 2013-11-17 ENCOUNTER — Encounter: Payer: Self-pay | Admitting: Family Medicine

## 2013-11-17 VITALS — BP 146/83 | HR 76 | Resp 16 | Ht 63.5 in | Wt 234.0 lb

## 2013-11-17 DIAGNOSIS — M549 Dorsalgia, unspecified: Secondary | ICD-10-CM

## 2013-11-17 NOTE — Patient Instructions (Signed)
1)  Back Pain - Your FMLA form was completed.   I would encourage you to reschedule your appointment to confirm what type of follow up you need for your back.    Back Injury Prevention Back injuries can be extremely painful and difficult to heal. After having one back injury, you are much more likely to experience another later on. It is important to learn how to avoid injuring or re-injuring your back. The following tips can help you to prevent a back injury. PHYSICAL FITNESS  Exercise regularly and try to develop good tone in your abdominal muscles. Your abdominal muscles provide a lot of the support needed by your back.  Do aerobic exercises (walking, jogging, biking, swimming) regularly.  Do exercises that increase balance and strength (tai chi, yoga) regularly. This can decrease your risk of falling and injuring your back.  Stretch before and after exercising.  Maintain a healthy weight. The more you weigh, the more stress is placed on your back. For every pound of weight, 10 times that amount of pressure is placed on the back. DIET  Talk to your caregiver about how much calcium and vitamin D you need per day. These nutrients help to prevent weakening of the bones (osteoporosis). Osteoporosis can cause broken (fractured) bones that lead to back pain.  Include good sources of calcium in your diet, such as dairy products, green, leafy vegetables, and products with calcium added (fortified).  Include good sources of vitamin D in your diet, such as milk and foods that are fortified with vitamin D.  Consider taking a nutritional supplement or a multivitamin if needed.  Stop smoking if you smoke. POSTURE  Sit and stand up straight. Avoid leaning forward when you sit or hunching over when you stand.  Choose chairs with good low back (lumbar) support.  If you work at a desk, sit close to your work so you do not need to lean over. Keep your chin tucked in. Keep your neck drawn back and  elbows bent at a right angle. Your arms should look like the letter "L."  Sit high and close to the steering wheel when you drive. Add a lumbar support to your car seat if needed.  Avoid sitting or standing in one position for too long. Take breaks to get up, stretch, and walk around at least once every hour. Take breaks if you are driving for long periods of time.  Sleep on your side with your knees slightly bent, or sleep on your back with a pillow under your knees. Do not sleep on your stomach. LIFTING, TWISTING, AND REACHING  Avoid heavy lifting, especially repetitive lifting. If you must do heavy lifting:  Stretch before lifting.  Work slowly.  Rest between lifts.  Use carts and dollies to move objects when possible.  Make several small trips instead of carrying 1 heavy load.  Ask for help when you need it.  Ask for help when moving big, awkward objects.  Follow these steps when lifting:  Stand with your feet shoulder-width apart.  Get as close to the object as you can. Do not try to pick up heavy objects that are far from your body.  Use handles or lifting straps if they are available.  Bend at your knees. Squat down, but keep your heels off the floor.  Keep your shoulders pulled back, your chin tucked in, and your back straight.  Lift the object slowly, tightening the muscles in your legs, abdomen, and buttocks. Keep the  object as close to the center of your body as possible.  When you put a load down, use these same guidelines in reverse.  Do not:  Lift the object above your waist.  Twist at the waist while lifting or carrying a load. Move your feet if you need to turn, not your waist.  Bend over without bending at your knees.  Avoid reaching over your head, across a table, or for an object on a high surface. OTHER TIPS  Avoid wet floors and keep sidewalks clear of ice to prevent falls.  Do not sleep on a mattress that is too soft or too hard.  Keep  items that are used frequently within easy reach.  Put heavier objects on shelves at waist level and lighter objects on lower or higher shelves.  Find ways to decrease your stress, such as exercise, massage, or relaxation techniques. Stress can build up in your muscles. Tense muscles are more vulnerable to injury.  Seek treatment for depression or anxiety if needed. These conditions can increase your risk of developing back pain. SEEK MEDICAL CARE IF:  You injure your back.  You have questions about diet, exercise, or other ways to prevent back injuries. MAKE SURE YOU:  Understand these instructions.  Will watch your condition.  Will get help right away if you are not doing well or get worse. Document Released: 09/19/2004 Document Revised: 11/04/2011 Document Reviewed: 09/23/2011 [facility] Patient Information 2014 [place], [place].

## 2013-11-17 NOTE — Progress Notes (Signed)
Subjective:    Patient ID: Megan Petersen, female    DOB: 19-Oct-1960, 53 y.o.   MRN: [id number]  HPI  [name] is here today to get her FMLA paper work filled out. She has been having chronic back pain for several months. She had a MRI in December and went for an injection which helped her discomfort.  She needs to have a FMLA form on hand just in case she needs to be out in the future.  She was supposed to follow up with the neurosurgeon and her appointment was cancelled during one of the snowstorms.  She is to call and reschedule.     Review of Systems  Constitutional: Negative for activity change, appetite change and unexpected weight change.  Musculoskeletal: Positive for back pain.  Neurological: Positive for numbness.       Left leg and toes   All other systems reviewed and are negative.    Past Medical History  Diagnosis Date  . Allergy   . Hypertension   . Hyperlipidemia   . Asthma   . GERD (gastroesophageal reflux disease)   . Morbid obesity   . Osteoarthritis   . Rosacea   . Trigeminal neuralgia   . LSIL (low grade squamous intraepithelial lesion) on Pap smear   . Atypical glandular cells on Pap smear   . Primary cough headache   . Osteoarthritis   . Esophageal reflux   . Impaired fasting glucose   . Swelling of limb   . Headache      Past Surgical History  Procedure Laterality Date  . Cholecystectomy    . Leep       History   Social History Narrative   Marital Status: Married Geneticist, molecular(Pat)   Children:  Sons Jeri Modena(Jeremiah, [place] [Lucas]); Daughter Leotis Shames(Lauren)    Living Situation:  Lives with spouse and children.   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 Heart disease Mother   . Hypertension Mother   . Cancer  Mother     Ovarian  . Drug abuse Sister   . Hypertension Sister   . Diabetes Sister   . Drug abuse Brother   . Hypertension Brother   . Diabetes Brother   . Cancer Father     Lymphoma     Current Outpatient Prescriptions on File Prior to Visit  Medication Sig Dispense Refill  . albuterol (PROAIR HFA) 108 (90 BASE) MCG/ACT inhaler Inhale 2 puffs into the lungs every 6 (six) hours as needed for wheezing or shortness of breath.  1 Inhaler  11  . budesonide-formoterol (SYMBICORT) 160-4.5 MCG/ACT inhaler Inhale 2 puffs into the lungs 2 (two) times daily.  1 Inhaler  11  . celecoxib (CELEBREX) 200 MG capsule Take 1 capsule (200 mg total) by mouth 2 (two) times daily.  60 capsule  2  . cyclobenzaprine (FLEXERIL) 10 MG tablet Take 1 tablet (10 mg total) by mouth at bedtime.  30 tablet  11  . diclofenac sodium (VOLTAREN) 1 % GEL Apply topically.      Marland Kitchen. esomeprazole (NEXIUM) 40 MG capsule Take 1 capsule (40 mg total) by mouth daily before breakfast.  30 capsule  11  . furosemide (LASIX) 40 MG tablet Take 1 tablet (40 mg total) by mouth daily.  30 tablet  11  . indomethacin (INDOCIN SR) 75 MG CR capsule Take 1 capsule (75 mg total) by mouth 2 (two) times daily with a meal.  60 capsule  11  . lidocaine (LIDODERM) 5 % Place 3 patches onto the skin daily. Remove & Discard patch within 12 hours or as directed by MD  90 patch  11  . metaxalone (SKELAXIN) 800 MG tablet Take 1 tablet (800 mg total) by mouth 3 (three) times daily.  90 tablet  5  . montelukast (SINGULAIR) 10 MG tablet Take 1 tablet (10 mg total) by mouth at bedtime.  30 tablet  11  . traMADol (ULTRAM) 50 MG tablet Take 1 tablet (50 mg total) by mouth every 6 (six) hours as needed for moderate pain.  120 tablet  3  . valsartan (DIOVAN) 80 MG tablet Take 1 tablet (80 mg total) by mouth daily.  30 tablet  11  . mupirocin nasal ointment (BACTROBAN) 2 % Apply in each nostril daily       No current facility-administered medications on file prior  to visit.     Allergies  Allergen Reactions  . Ace Inhibitors Cough  . Demerol [Meperidine] Nausea And Vomiting  . Erythromycin Nausea And Vomiting  . Lyrica [Pregabalin] Other (See Comments)    Blisters   . Shellfish Allergy Nausea Only    Headaches, tingling in lips  . Codeine Rash     Immunization History  Administered Date(s) Administered  . Pneumococcal Polysaccharide-23 03/23/2009  . Tdap 09/01/2008  . Zoster 03/20/2012       Objective:   Physical Exam  Nursing note and vitals reviewed. Constitutional: She appears well-nourished. No distress (She is uncomfortale but does not appear to be distresed.  ).  Neck: Neck supple.  Cardiovascular: Normal rate and regular rhythm.   Pulmonary/Chest: Effort normal and breath sounds normal.  Musculoskeletal: She exhibits tenderness.       Lumbar back: She exhibits tenderness and spasm. She exhibits no swelling.       Back:          Assessment & Plan:

## 2013-12-18 ENCOUNTER — Other Ambulatory Visit: Payer: Self-pay | Admitting: Family Medicine

## 2014-01-01 ENCOUNTER — Other Ambulatory Visit: Payer: Self-pay | Admitting: Family Medicine

## 2014-01-10 ENCOUNTER — Ambulatory Visit (INDEPENDENT_AMBULATORY_CARE_PROVIDER_SITE_OTHER): Payer: BC Managed Care – PPO | Admitting: Family Medicine

## 2014-01-10 ENCOUNTER — Encounter: Payer: Self-pay | Admitting: Family Medicine

## 2014-01-10 VITALS — BP 148/85 | HR 70 | Resp 16 | Ht 62.5 in | Wt 237.0 lb

## 2014-01-10 DIAGNOSIS — R05 Cough: Secondary | ICD-10-CM

## 2014-01-10 DIAGNOSIS — R059 Cough, unspecified: Secondary | ICD-10-CM

## 2014-01-10 DIAGNOSIS — J069 Acute upper respiratory infection, unspecified: Secondary | ICD-10-CM

## 2014-01-10 DIAGNOSIS — B373 Candidiasis of vulva and vagina: Secondary | ICD-10-CM

## 2014-01-10 DIAGNOSIS — J3489 Other specified disorders of nose and nasal sinuses: Secondary | ICD-10-CM

## 2014-01-10 DIAGNOSIS — B3731 Acute candidiasis of vulva and vagina: Secondary | ICD-10-CM

## 2014-01-10 DIAGNOSIS — R062 Wheezing: Secondary | ICD-10-CM

## 2014-01-10 MED ORDER — TERCONAZOLE 0.8 % VA CREA: 1.0000 | TOPICAL_CREAM | Freq: Every day | VAGINAL | Status: DC

## 2014-01-10 MED ORDER — CEFDINIR 300 MG PO CAPS: 300.0000 mg | ORAL_CAPSULE | Freq: Two times a day (BID) | ORAL | Status: DC

## 2014-01-10 MED ORDER — METHYLPREDNISOLONE SODIUM SUCC 125 MG IJ SOLR
125.0000 mg | Freq: Once | INTRAMUSCULAR | Status: AC
  Administered 2014-01-10: 125 mg via INTRAMUSCULAR

## 2014-01-10 MED ORDER — BENZONATATE 200 MG PO CAPS: 200.0000 mg | ORAL_CAPSULE | Freq: Three times a day (TID) | ORAL | Status: DC | PRN

## 2014-01-10 MED ORDER — KETOROLAC TROMETHAMINE 60 MG/2ML IM SOLN
60.0000 mg | Freq: Once | INTRAMUSCULAR | Status: AC
  Administered 2014-01-10: 60 mg via INTRAMUSCULAR

## 2014-01-10 MED ORDER — FLUCONAZOLE 150 MG PO TABS: 150.0000 mg | ORAL_TABLET | Freq: Once | ORAL | Status: DC

## 2014-01-10 NOTE — Patient Instructions (Signed)
1)  Headache - Zyrtec D 5/120 1-2 x per day   2)  Chest/Cough - Mucinex DM 1200/60 add the Tessalon Perles 3 x per day.  Umcka Cold Care 2 droppers 3-4 x per day.    3)  Sinus/Lung Infection - Omnicef 2 x per day for 10 days.    Sinusitis Sinusitis is redness, soreness, and swelling (inflammation) of the paranasal sinuses. Paranasal sinuses are air pockets within the bones of your face (beneath the eyes, the middle of the forehead, or above the eyes). In healthy paranasal sinuses, mucus is able to drain out, and air is able to circulate through them by way of your nose. However, when your paranasal sinuses are inflamed, mucus and air can become trapped. This can allow bacteria and other germs to grow and cause infection. Sinusitis can develop quickly and last only a short time (acute) or continue over a long period (chronic). Sinusitis that lasts for more than 12 weeks is considered chronic.  CAUSES  Causes of sinusitis include:  Allergies.  Structural abnormalities, such as displacement of the cartilage that separates your nostrils (deviated septum), which can decrease the air flow through your nose and sinuses and affect sinus drainage.  Functional abnormalities, such as when the small hairs (cilia) that line your sinuses and help remove mucus do not work properly or are not present. SYMPTOMS  Symptoms of acute and chronic sinusitis are the same. The primary symptoms are pain and pressure around the affected sinuses. Other symptoms include:  Upper toothache.  Earache.  Headache.  Bad breath.  Decreased sense of smell and taste.  A cough, which worsens when you are lying flat.  Fatigue.  Fever.  Thick drainage from your nose, which often is green and may contain pus (purulent).  Swelling and warmth over the affected sinuses. DIAGNOSIS  Your caregiver will perform a physical exam. During the exam, your caregiver may:  Look in your nose for signs of abnormal growths in your  nostrils (nasal polyps).  Tap over the affected sinus to check for signs of infection.  View the inside of your sinuses (endoscopy) with a special imaging device with a light attached (endoscope), which is inserted into your sinuses. If your caregiver suspects that you have chronic sinusitis, one or more of the following tests may be recommended:  Allergy tests.  Nasal culture A sample of mucus is taken from your nose and sent to a lab and screened for bacteria.  Nasal cytology A sample of mucus is taken from your nose and examined by your caregiver to determine if your sinusitis is related to an allergy. TREATMENT  Most cases of acute sinusitis are related to a viral infection and will resolve on their own within 10 days. Sometimes medicines are prescribed to help relieve symptoms (pain medicine, decongestants, nasal steroid sprays, or saline sprays).  However, for sinusitis related to a bacterial infection, your caregiver will prescribe antibiotic medicines. These are medicines that will help kill the bacteria causing the infection.  Rarely, sinusitis is caused by a fungal infection. In theses cases, your caregiver will prescribe antifungal medicine. For some cases of chronic sinusitis, surgery is needed. Generally, these are cases in which sinusitis recurs more than 3 times per year, despite other treatments. HOME CARE INSTRUCTIONS   Drink plenty of water. Water helps thin the mucus so your sinuses can drain more easily.  Use a humidifier.  Inhale steam 3 to 4 times a day (for example, sit in the bathroom  with the shower running).  Apply a warm, moist washcloth to your face 3 to 4 times a day, or as directed by your caregiver.  Use saline nasal sprays to help moisten and clean your sinuses.  Take over-the-counter or prescription medicines for pain, discomfort, or fever only as directed by your caregiver. SEEK IMMEDIATE MEDICAL CARE IF:  You have increasing pain or severe  headaches.  You have nausea, vomiting, or drowsiness.  You have swelling around your face.  You have vision problems.  You have a stiff neck.  You have difficulty breathing. MAKE SURE YOU:   Understand these instructions.  Will watch your condition.  Will get help right away if you are not doing well or get worse. Document Released: 08/12/2005 Document Revised: 11/04/2011 Document Reviewed: 08/27/2011 [facility] Patient Information 2014 [place], [place].

## 2014-01-10 NOTE — Progress Notes (Signed)
Subjective:    Patient ID: Megan Petersen, female    DOB: 08/19/1961, 53 y.o.   MRN: [id number]  HPI  Megan Petersen is here today complaining of several URI symptoms including head congestion, cough and wheezing.  She has been having these symptoms for the past 2 weeks.  She describes her symptoms as being moderate in nature and she feels that she is worsening.   She has taken several OTC meds including Zicam, Nyquil and Mucinex which have not helped her very much.  Her blood pressure is really elevated today but she did have a flat tire on the way here which she feels is the reason it is high.      Review of Systems  Constitutional: Positive for fatigue. Negative for activity change and appetite change.  HENT: Positive for congestion and sinus pressure.   Respiratory: Positive for cough, shortness of breath and wheezing.   Cardiovascular: Negative for chest pain, palpitations and leg swelling.  All other systems reviewed and are negative.   Past Medical History  Diagnosis Date  . Allergy   . Hypertension   . Hyperlipidemia   . Asthma   . GERD (gastroesophageal reflux disease)   . Morbid obesity   . Osteoarthritis   . Rosacea   . Trigeminal neuralgia   . LSIL (low grade squamous intraepithelial lesion) on Pap smear   . Atypical glandular cells on Pap smear   . Primary cough headache   . Osteoarthritis   . Esophageal reflux   . Impaired fasting glucose   . Swelling of limb   . Headache      Past Surgical History  Procedure Laterality Date  . Cholecystectomy    . Leep       History   Social History Narrative   Marital Status: Married Geneticist, molecular(Pat)   Children:  Sons Jeri Modena([place], [place] [Lucas]); Daughter Leotis Shames(Lauren)    Living Situation:  Lives with spouse and children.   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 Heart disease Mother   . Hypertension Mother   . Cancer Mother     Ovarian  . Drug abuse Sister   . Hypertension Sister   . Diabetes Sister   . Drug abuse Brother   . Hypertension Brother   . Diabetes Brother   . Cancer Father     Lymphoma     Current Outpatient Prescriptions on File Prior to Visit  Medication Sig Dispense Refill  . albuterol (PROAIR HFA) 108 (90 BASE) MCG/ACT inhaler Inhale 2 puffs into the lungs every 6 (six) hours as needed for wheezing or shortness of breath.  1 Inhaler  11  . budesonide-formoterol (SYMBICORT) 160-4.5 MCG/ACT inhaler Inhale 2 puffs into the lungs 2 (two) times daily.  1 Inhaler  11  . cyclobenzaprine (FLEXERIL) 10 MG tablet Take 1 tablet (10 mg total) by mouth at bedtime.  30 tablet  11  . diclofenac sodium (VOLTAREN) 1 % GEL Apply topically.      Marland Kitchen. esomeprazole (NEXIUM) 40 MG capsule Take 1 capsule (40 mg total) by mouth daily before breakfast.  30 capsule  11  . furosemide (LASIX)  40 MG tablet Take 1 tablet (40 mg total) by mouth daily.  30 tablet  11  . indomethacin (INDOCIN SR) 75 MG CR capsule Take 1 capsule (75 mg total) by mouth 2 (two) times daily with a meal.  60 capsule  11  . lidocaine (LIDODERM) 5 % Place 3 patches onto the skin daily. Remove & Discard patch within 12 hours or as directed by MD  90 patch  11  . metaxalone (SKELAXIN) 800 MG tablet Take 1 tablet (800 mg total) by mouth 3 (three) times daily.  90 tablet  5  . montelukast (SINGULAIR) 10 MG tablet Take 1 tablet (10 mg total) by mouth at bedtime.  30 tablet  11  . valsartan (DIOVAN) 80 MG tablet Take 1 tablet (80 mg total) by mouth daily.  30 tablet  11   No current facility-administered medications on file prior to visit.     Allergies  Allergen Reactions  . Ace Inhibitors Cough  . Demerol [Meperidine] Nausea And Vomiting  . Erythromycin Nausea And Vomiting  . Lyrica [Pregabalin]  Other (See Comments)    Blisters   . Shellfish Allergy Nausea Only    Headaches, tingling in lips  . Codeine Rash     Immunization History  Administered Date(s) Administered  . Pneumococcal Polysaccharide-23 03/23/2009  . Tdap 09/01/2008  . Zoster 03/20/2012        Objective:   Physical Exam  Constitutional: She appears well-nourished. No distress.  HENT:  Head: Normocephalic.  Nose: Right sinus exhibits maxillary sinus tenderness and frontal sinus tenderness. Left sinus exhibits maxillary sinus tenderness and frontal sinus tenderness.  Mouth/Throat: No oropharyngeal exudate.  Eyes: Conjunctivae are normal. Right eye exhibits no discharge. Left eye exhibits no discharge.  Neck: Neck supple.  Cardiovascular: Normal rate, regular rhythm and normal heart sounds.  Exam reveals no gallop and no friction rub.   No murmur heard. Pulmonary/Chest: Effort normal. She has wheezes. She exhibits no tenderness.  Lymphadenopathy:    She has no cervical adenopathy.  Neurological: She is alert.  Skin: Skin is warm and dry. No rash noted.  Psychiatric: She has a normal mood and affect.      Assessment & Plan:    Megan Petersen was seen today for uri.  Diagnoses and associated orders for this visit:  Cough Comments: Since she is worsening after 2 weeks, we'll treat with Omnicef.   - cefdinir (OMNICEF) 300 MG capsule; Take 1 capsule (300 mg total) by mouth 2 (two) times daily. - benzonatate (TESSALON) 200 MG capsule; Take 1 capsule (200 mg total) by mouth 3 (three) times daily as needed for cough. -  Wheezing Comments: She was given an injection of Solu-Medrol.   - methylPREDNISolone sodium succinate (SOLU-MEDROL) 125 mg/2 mL injection 125 mg; Inject 2 mLs (125 mg total) into the muscle once.   Candidiasis of vagina - fluconazole (DIFLUCAN) 150 MG tablet; Take 1 tablet (150 mg total) by mouth once. -  terconazole (TERAZOL 3) 0.8 % vaginal cream; Place 1 applicator vaginally at  bedtime.  Sinus pain Comments: She was given an injection of Toradol.   - ketorolac (TORADOL) injection 60 mg; Inject 2 mLs (60 mg total) into the muscle once.

## 2014-01-12 ENCOUNTER — Encounter: Payer: Self-pay | Admitting: Family Medicine

## 2014-01-13 ENCOUNTER — Encounter: Payer: Self-pay | Admitting: Family Medicine

## 2014-01-13 ENCOUNTER — Ambulatory Visit (INDEPENDENT_AMBULATORY_CARE_PROVIDER_SITE_OTHER): Payer: BC Managed Care – PPO | Admitting: Family Medicine

## 2014-01-13 VITALS — BP 147/92 | HR 91 | Temp 98.6°F | Resp 16 | Ht 63.5 in | Wt 233.0 lb

## 2014-01-13 DIAGNOSIS — M545 Low back pain, unspecified: Secondary | ICD-10-CM

## 2014-01-13 DIAGNOSIS — R062 Wheezing: Secondary | ICD-10-CM

## 2014-01-13 MED ORDER — TRAMADOL HCL 50 MG PO TABS: 50.0000 mg | ORAL_TABLET | Freq: Four times a day (QID) | ORAL | Status: DC | PRN

## 2014-01-13 MED ORDER — PREDNISONE 50 MG PO TABS: 50.0000 mg | ORAL_TABLET | Freq: Every day | ORAL | Status: DC

## 2014-01-13 MED ORDER — FORMOTEROL FUMARATE 20 MCG/2ML IN NEBU: 20.0000 ug | INHALATION_SOLUTION | Freq: Two times a day (BID) | RESPIRATORY_TRACT | Status: DC

## 2014-01-13 NOTE — Patient Instructions (Signed)
1)  Cough/Chest Tightness - Try the Perforomist up to 2 x per day along with the tramadol up to 4 x per day.  You might consider taking a tsp of Benadryl 30 mins before trying 1/4 - 1/2 tsp of Tussionex.  If all else fails, you can take the Prednisone 50 mg for 5 days.    2)  Diarrhea - BRAT Diet (Bananas, Rice, Applesauce, Toast) plus double your acidolphilus.

## 2014-01-13 NOTE — Progress Notes (Signed)
Subjective:    Patient ID: Megan Petersen, female    DOB: Oct 11, 1960, 53 y.o.   MRN: [id number]  HPI   Megan Petersen is today complaining of URI symptoms. She was seen this past Monday with the same symptoms however she feels worse and now has diarrhea. She has been using her inhalers and has been taking her cough meds.  Her cough is worse when she lays down.    Review of Systems  Constitutional: Positive for fatigue. Negative for fever.  HENT: Positive for congestion.   Respiratory: Positive for cough and chest tightness.   Cardiovascular: Negative for chest pain.  All other systems reviewed and are negative.    Past Medical History  Diagnosis Date  . Allergy   . Hypertension   . Hyperlipidemia   . Asthma   . GERD (gastroesophageal reflux disease)   . Morbid obesity   . Osteoarthritis   . Rosacea   . Trigeminal neuralgia   . LSIL (low grade squamous intraepithelial lesion) on Pap smear   . Atypical glandular cells on Pap smear   . Primary cough headache   . Osteoarthritis   . Esophageal reflux   . Impaired fasting glucose   . Swelling of limb   . Headache      Past Surgical History  Procedure Laterality Date  . Cholecystectomy    . Leep       History   Social History Narrative   Marital Status: Married Geneticist, molecular(Pat)   Children:  Sons Megan Petersen(Megan Petersen, Megan [Lucas]); Daughter Megan Petersen(Megan Petersen)    Living Situation:  Lives with spouse and children.   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 Heart disease Mother   . Hypertension Mother   . Cancer Mother     Ovarian  . Drug abuse Sister   . Hypertension Sister   . Diabetes Sister   . Drug abuse Brother   . Hypertension Brother   . Diabetes Brother   . Cancer Father    Lymphoma     Current Outpatient Prescriptions on File Prior to Visit  Medication Sig Dispense Refill  . albuterol (PROAIR HFA) 108 (90 BASE) MCG/ACT inhaler Inhale 2 puffs into the lungs every 6 (six) hours as needed for wheezing or shortness of breath.  1 Inhaler  11  . budesonide-formoterol (SYMBICORT) 160-4.5 MCG/ACT inhaler Inhale 2 puffs into the lungs 2 (two) times daily.  1 Inhaler  11  . cyclobenzaprine (FLEXERIL) 10 MG tablet Take 1 tablet (10 mg total) by mouth at bedtime.  30 tablet  11  . diclofenac sodium (VOLTAREN) 1 % GEL Apply topically.      Marland Kitchen. esomeprazole (NEXIUM) 40 MG capsule Take 1 capsule (40 mg total) by mouth daily before breakfast.  30 capsule  11  . furosemide (LASIX) 40 MG tablet Take 1 tablet (40 mg total) by mouth daily.  30 tablet  11  . indomethacin (INDOCIN SR) 75 MG CR capsule Take 1 capsule (75 mg total) by mouth 2 (two) times daily with a meal.  60 capsule  11  . lidocaine (LIDODERM) 5 % Place 3 patches onto the skin daily. Remove &  Discard patch within 12 hours or as directed by MD  90 patch  11  . metaxalone (SKELAXIN) 800 MG tablet Take 1 tablet (800 mg total) by mouth 3 (three) times daily.  90 tablet  5  . montelukast (SINGULAIR) 10 MG tablet Take 1 tablet (10 mg total) by mouth at bedtime.  30 tablet  11  . valsartan (DIOVAN) 80 MG tablet Take 1 tablet (80 mg total) by mouth daily.  30 tablet  11   No current facility-administered medications on file prior to visit.     Allergies  Allergen Reactions  . Ace Inhibitors Cough  . Demerol [Meperidine] Nausea And Vomiting  . Erythromycin Nausea And Vomiting  . Lyrica [Pregabalin] Other (See Comments)    Blisters   . Shellfish Allergy Nausea Only    Headaches, tingling in lips  . Codeine Rash     Immunization History  Administered Date(s) Administered  . Pneumococcal Polysaccharide-23 03/23/2009  . Tdap 09/01/2008  . Zoster 03/20/2012       Objective:   Physical Exam  Nursing note and  vitals reviewed. Constitutional: She appears well-nourished. No distress.  HENT:  Head: Normocephalic.  Mouth/Throat: No oropharyngeal exudate.  Eyes: Conjunctivae are normal. Right eye exhibits no discharge. Left eye exhibits no discharge.  Neck: Neck supple.  Cardiovascular: Normal rate, regular rhythm and normal heart sounds.  Exam reveals no gallop and no friction rub.   No murmur heard. Pulmonary/Chest: Effort normal and breath sounds normal. She has no wheezes. She exhibits no tenderness.  Lymphadenopathy:    She has no cervical adenopathy.  Neurological: She is alert.  Skin: Skin is warm and dry. No rash noted.  Psychiatric: She has a normal mood and affect.      Assessment & Plan:    Megan Petersen was seen today for uri.  Diagnoses and associated orders for this visit:  Wheezing - formoterol (PERFOROMIST) 20 MCG/2ML nebulizer solution; Take 2 mLs (20 mcg total) by nebulization 2 (two) times daily. - predniSONE (DELTASONE) 50 MG tablet; Take 1 tablet (50 mg total) by mouth daily with breakfast.  Back pain - traMADol (ULTRAM) 50 MG tablet; Take 1 tablet (50 mg total) by mouth every 6 (six) hours as needed for moderate pain.

## 2014-01-14 ENCOUNTER — Ambulatory Visit: Payer: BC Managed Care – PPO | Admitting: Family Medicine

## 2014-03-09 ENCOUNTER — Encounter: Payer: Self-pay | Admitting: Family Medicine

## 2014-03-09 ENCOUNTER — Encounter: Payer: Self-pay | Admitting: *Deleted

## 2014-03-09 ENCOUNTER — Ambulatory Visit (INDEPENDENT_AMBULATORY_CARE_PROVIDER_SITE_OTHER): Payer: BC Managed Care – PPO | Admitting: Family Medicine

## 2014-03-09 VITALS — BP 137/84 | HR 80 | Resp 16 | Ht 63.5 in | Wt 239.0 lb

## 2014-03-09 DIAGNOSIS — I10 Essential (primary) hypertension: Secondary | ICD-10-CM

## 2014-03-09 DIAGNOSIS — J45909 Unspecified asthma, uncomplicated: Secondary | ICD-10-CM

## 2014-03-09 DIAGNOSIS — M545 Low back pain, unspecified: Secondary | ICD-10-CM

## 2014-03-09 DIAGNOSIS — M5442 Lumbago with sciatica, left side: Secondary | ICD-10-CM

## 2014-03-09 DIAGNOSIS — M5430 Sciatica, unspecified side: Secondary | ICD-10-CM

## 2014-03-09 MED ORDER — METHYLPREDNISOLONE SODIUM SUCC 125 MG IJ SOLR
125.0000 mg | Freq: Once | INTRAMUSCULAR | Status: AC
  Administered 2014-03-09: 125 mg via INTRAMUSCULAR

## 2014-03-09 MED ORDER — KETOROLAC TROMETHAMINE 60 MG/2ML IM SOLN
60.0000 mg | Freq: Once | INTRAMUSCULAR | Status: AC
  Administered 2014-03-09: 60 mg via INTRAMUSCULAR

## 2014-03-09 MED ORDER — TRAMADOL HCL 50 MG PO TABS: 50.0000 mg | ORAL_TABLET | Freq: Four times a day (QID) | ORAL | Status: AC | PRN

## 2014-03-09 NOTE — Progress Notes (Signed)
Subjective:    Patient ID: Megan Petersen, female    DOB: 06/24/1961, 53 y.o.   MRN: [id number]  HPI  Megan Petersen is here today complaining of severe back pain. She has been having this pain on and off for about 6 months. She says that the pain shoots down her left leg. She has been using Voltaren Gel, Lidocaine patches, Tramadol and Skelaxin for her pain. She feels that it is not improving.    Review of Systems  Constitutional: Positive for activity change.  Musculoskeletal: Positive for back pain.  Neurological: Positive for numbness.  All other systems reviewed and are negative.    Past Medical History  Diagnosis Date  . Allergy   . Hypertension   . Hyperlipidemia   . Asthma   . GERD (gastroesophageal reflux disease)   . Morbid obesity   . Osteoarthritis   . Rosacea   . Trigeminal neuralgia   . LSIL (low grade squamous intraepithelial lesion) on Pap smear   . Atypical glandular cells on Pap smear   . Primary cough headache   . Osteoarthritis   . Esophageal reflux   . Impaired fasting glucose   . Swelling of limb   . Headache      Past Surgical History  Procedure Laterality Date  . Cholecystectomy    . Leep       History   Social History Narrative   Marital Status: Married Geneticist, molecular)   Children:  Sons Jeri Modena, [place] [Lucas]); Daughter Leotis Shames)    Living Situation:  Lives with spouse and children.   Occupation: Chief Financial Officer)   Education:  Engineer, agricultural   Tobacco Use/Exposure:  None    Alcohol Use: None   Drug Use:  None   Diet:  Regular   Exercise:  Minimal   Hobbies:  Reading, Traveling, Movies, Knitting)               Family History  Problem Relation Age of Onset  . Heart disease Mother   . Hypertension Mother   . Cancer Mother     Ovarian  . Drug abuse Sister   . Hypertension Sister   . Diabetes Sister   . Drug abuse Brother   . Hypertension Brother   . Diabetes Brother   . Cancer Father     Lymphoma     Current Outpatient  Prescriptions on File Prior to Visit  Medication Sig Dispense Refill  . albuterol (PROAIR HFA) 108 (90 BASE) MCG/ACT inhaler Inhale 2 puffs into the lungs every 6 (six) hours as needed for wheezing or shortness of breath.  1 Inhaler  11  . budesonide-formoterol (SYMBICORT) 160-4.5 MCG/ACT inhaler Inhale 2 puffs into the lungs 2 (two) times daily.  1 Inhaler  11  . cyclobenzaprine (FLEXERIL) 10 MG tablet Take 1 tablet (10 mg total) by mouth at bedtime.  30 tablet  11  . diclofenac sodium (VOLTAREN) 1 % GEL Apply topically.      Marland Kitchen esomeprazole (NEXIUM) 40 MG capsule Take 1 capsule (40 mg total) by mouth daily before breakfast.  30 capsule  11  . furosemide (LASIX) 40 MG tablet Take 1 tablet (40 mg total) by mouth daily.  30 tablet  11  . indomethacin (INDOCIN SR) 75 MG CR capsule Take 1 capsule (75 mg total) by mouth 2 (two) times daily with a meal.  60 capsule  11  . lidocaine (LIDODERM) 5 % Place 3 patches onto the skin daily. Remove & Discard patch within 12  hours or as directed by MD  90 patch  11  . metaxalone (SKELAXIN) 800 MG tablet Take 1 tablet (800 mg total) by mouth 3 (three) times daily.  90 tablet  5  . montelukast (SINGULAIR) 10 MG tablet Take 1 tablet (10 mg total) by mouth at bedtime.  30 tablet  11  . valsartan (DIOVAN) 80 MG tablet Take 1 tablet (80 mg total) by mouth daily.  30 tablet  11   No current facility-administered medications on file prior to visit.     Allergies  Allergen Reactions  . Ace Inhibitors Cough  . Demerol [Meperidine] Nausea And Vomiting  . Erythromycin Nausea And Vomiting  . Lyrica [Pregabalin] Other (See Comments)    Blisters   . Shellfish Allergy Nausea Only    Headaches, tingling in lips  . Codeine Rash     Immunization History  Administered Date(s) Administered  . Pneumococcal Polysaccharide-23 03/23/2009  . Tdap 09/01/2008  . Zoster 03/20/2012       Objective:   Physical Exam  Nursing note and vitals reviewed. Constitutional: She  appears well-nourished. No distress (She is uncomfortale but does not appear to be distresed.  ).  Neck: Neck supple.  Cardiovascular: Normal rate and regular rhythm.   Pulmonary/Chest: Effort normal and breath sounds normal.  Musculoskeletal: She exhibits tenderness.       Lumbar back: She exhibits tenderness and spasm. She exhibits no swelling.       Back:      Assessment & Plan:   Megan Petersen was seen today for back pain.  Diagnoses and associated orders for this visit:  Left-sided low back pain with left-sided sciatica Comments: Megan Petersen was set up with the neurosurgery office in [place] about 6 months ago. She received injections which helped her for several months. She was supposed to have followed up with them and did not.  I called their office today and rescheduled her to be seen.     - traMADol (ULTRAM) 50 MG tablet; Take 1 tablet (50 mg total) by mouth every 6 (six) hours as needed for moderate pain. - methylPREDNISolone sodium succinate (SOLU-MEDROL) 125 mg/2 mL injection 125 mg; Inject 2 mLs (125 mg total) into the muscle once. - ketorolac (TORADOL) injection 60 mg; Inject 2 mLs (60 mg total) into the muscle once.

## 2014-03-23 ENCOUNTER — Other Ambulatory Visit: Payer: Self-pay | Admitting: *Deleted

## 2014-03-23 DIAGNOSIS — I10 Essential (primary) hypertension: Secondary | ICD-10-CM

## 2014-03-23 DIAGNOSIS — E785 Hyperlipidemia, unspecified: Secondary | ICD-10-CM

## 2014-03-24 ENCOUNTER — Other Ambulatory Visit: Payer: BC Managed Care – PPO

## 2014-03-31 ENCOUNTER — Ambulatory Visit: Payer: BC Managed Care – PPO | Admitting: Family Medicine

## 2014-04-07 DIAGNOSIS — R062 Wheezing: Secondary | ICD-10-CM | POA: Insufficient documentation

## 2014-04-20 MED ORDER — MONTELUKAST SODIUM 10 MG PO TABS: 10.0000 mg | ORAL_TABLET | Freq: Every day | ORAL | Status: DC

## 2014-04-20 MED ORDER — VALSARTAN 80 MG PO TABS: 80.0000 mg | ORAL_TABLET | Freq: Every day | ORAL | Status: DC

## 2014-04-20 MED ORDER — BUDESONIDE-FORMOTEROL FUMARATE 160-4.5 MCG/ACT IN AERO: 2.0000 | INHALATION_SPRAY | Freq: Two times a day (BID) | RESPIRATORY_TRACT | Status: DC

## 2014-04-20 NOTE — Addendum Note (Signed)
Addended by: Birdena Jubilee on: 04/20/2014 07:33 PM   Modules accepted: Level of Service

## 2014-08-26 DIAGNOSIS — J189 Pneumonia, unspecified organism: Secondary | ICD-10-CM

## 2014-08-26 HISTORY — DX: Pneumonia, unspecified organism: J18.9

## 2014-11-06 ENCOUNTER — Encounter: Payer: Self-pay | Admitting: Emergency Medicine

## 2014-11-06 ENCOUNTER — Emergency Department (INDEPENDENT_AMBULATORY_CARE_PROVIDER_SITE_OTHER)
Admission: EM | Admit: 2014-11-06 | Discharge: 2014-11-06 | Disposition: A | Discharge status: Home / Self Care | Payer: BLUE CROSS/BLUE SHIELD | Source: Home / Self Care | Attending: Family Medicine | Admitting: Family Medicine

## 2014-11-06 DIAGNOSIS — R197 Diarrhea, unspecified: Secondary | ICD-10-CM

## 2014-11-06 DIAGNOSIS — J029 Acute pharyngitis, unspecified: Secondary | ICD-10-CM

## 2014-11-06 MED ORDER — PREDNISONE 20 MG PO TABS: 20.0000 mg | ORAL_TABLET | Freq: Two times a day (BID) | ORAL | Status: DC

## 2014-11-06 MED ORDER — PENICILLIN V POTASSIUM 500 MG PO TABS: ORAL_TABLET | ORAL | Status: DC

## 2014-11-06 MED ORDER — LIDOCAINE VISCOUS 2 % MT SOLN: 15.0000 mL | Freq: Three times a day (TID) | OROMUCOSAL | Status: DC

## 2014-11-06 NOTE — ED Notes (Signed)
Patient has been ill for 4 days; went to PCP 4 days ago and was placed on z-pack, which she has completed; has frequent sinus infections. Took tylenol at 1130 today, but only low grade fevers.

## 2014-11-06 NOTE — ED Provider Notes (Signed)
CSN: [id number]     Arrival date & time 11/06/14  1436 History   First MD Initiated Contact with Patient 11/06/14 1607     Chief Complaint  Patient presents with  . Sore Throat   (Consider location/radiation/quality/duration/timing/severity/associated sxs/prior Treatment) HPI Comments: Patient complains of four day history of typical cold-like symptoms including mild sore throat, sinus congestion, headache, myalgias, fatigue, fever/chills, and occasional cough.  During the past week she has had intermittent nausea and diarrhea. Four days ago she visited her PCP who prescribed a Z-pack.  The history is provided by the patient.    Past Medical History  Diagnosis Date  . Allergy   . Hypertension   . Hyperlipidemia   . Asthma   . GERD (gastroesophageal reflux disease)   . Morbid obesity   . Osteoarthritis   . Rosacea   . Trigeminal neuralgia   . LSIL (low grade squamous intraepithelial lesion) on Pap smear   . Atypical glandular cells on Pap smear   . Primary cough headache   . Osteoarthritis   . Esophageal reflux   . Impaired fasting glucose   . Swelling of limb   . Headache    Past Surgical History  Procedure Laterality Date  . Cholecystectomy    . Leep     Family History  Problem Relation Age of Onset  . Heart disease Mother   . Hypertension Mother   . Cancer Mother     Ovarian  . Drug abuse Sister   . Hypertension Sister   . Diabetes Sister   . Drug abuse Brother   . Hypertension Brother   . Diabetes Brother   . Cancer Father     Lymphoma   History  Substance Use Topics  . Smoking status: Never Smoker   . Smokeless tobacco: Never Used  . Alcohol Use: No   OB History    No data available     Review of Systems + sore throat + hoarse Rare cough No pleuritic pain No wheezing + nasal congestion + post-nasal drainage + sinus pain/pressure No itchy/red eyes + earache No hemoptysis No SOB + fever, + chills + nausea No vomiting No abdominal  pain + diarrhea No urinary symptoms No skin rash + fatigue + myalgias + headache Used OTC meds without relief  Allergies  Ace inhibitors; Demerol; Erythromycin; Lyrica; Shellfish allergy; and Codeine  Home Medications   Prior to Admission medications   Medication Sig Start Date End Date Taking? Authorizing Provider  albuterol (PROAIR HFA) 108 (90 BASE) MCG/ACT inhaler Inhale 2 puffs into the lungs every 6 (six) hours as needed for wheezing or shortness of breath. 09/20/13   Gillian Scarce, MD  budesonide-formoterol (SYMBICORT) 160-4.5 MCG/ACT inhaler Inhale 2 puffs into the lungs 2 (two) times daily. 04/20/14 04/20/15  Gillian Scarce, MD  diclofenac sodium (VOLTAREN) 1 % GEL Apply topically.    Historical Provider, MD  esomeprazole (NEXIUM) 40 MG capsule Take 1 capsule (40 mg total) by mouth daily before breakfast. 09/20/13 09/20/14  Gillian Scarce, MD  furosemide (LASIX) 40 MG tablet Take 1 tablet (40 mg total) by mouth daily. 09/20/13 09/20/14  Gillian Scarce, MD  lidocaine (XYLOCAINE) 2 % solution Use as directed 15 mLs in the mouth or throat 4 (four) times daily -  before meals and at bedtime. Gargle, then expectorate 11/06/14   Lattie Haw, MD  montelukast (SINGULAIR) 10 MG tablet Take 1 tablet (10 mg total) by mouth at bedtime. 04/20/14 04/20/15  Gillian Scarceobyn K Zanard, MD  penicillin v potassium (VEETID) 500 MG tablet Take one tab by mouth twice daily for 10 days 11/06/14   Lattie HawStephen A Beese, MD  predniSONE (DELTASONE) 20 MG tablet Take 1 tablet (20 mg total) by mouth 2 (two) times daily. Take with food. 11/06/14   Lattie HawStephen A Beese, MD  traMADol (ULTRAM) 50 MG tablet Take 1 tablet (50 mg total) by mouth every 6 (six) hours as needed for moderate pain. 03/09/14 03/09/15  Gillian Scarceobyn K Zanard, MD  valsartan (DIOVAN) 80 MG tablet Take 1 tablet (80 mg total) by mouth daily. 04/20/14 04/20/15  Gillian Scarceobyn K Zanard, MD   BP 144/84 mmHg  Pulse 79  Temp(Src) 98.6 F (37 C) (Oral)  Resp 18  Ht 5' 2.5" (1.588 m)  Wt 240  lb (108.863 kg)  BMI 43.17 kg/m2  SpO2 97%  LMP 10/11/2014 (Exact Date) Physical Exam Nursing notes and Vital Signs reviewed. Appearance:  Patient appears stated age, and in no acute distress.  Patient is obese (BMI 43.2) Eyes:  Pupils are equal, round, and reactive to light and accomodation.  Extraocular movement is intact.  Conjunctivae are not inflamed  Ears:  Canals normal.  Tympanic membranes normal.  Nose:  Mildly congested turbinates.  No sinus tenderness.    Pharynx:  Erythematous and slightly swollen without obstruction.  Neck:  Supple.  Tender enlarged anterior nodes are palpated bilaterally  Lungs:  Clear to auscultation.  Breath sounds are equal.  Heart:  Regular rate and rhythm without murmurs, rubs, or gallops.  Abdomen:  Nontender without masses or hepatosplenomegaly.  Bowel sounds are present.  No CVA or flank tenderness.  Extremities:  No edema.  No calf tenderness Skin:  No rash present.   ED Course  Procedures none   Labs Reviewed  STREP A DNA PROBE      MDM   1. Diarrhea   2. Acute pharyngitis, unspecified pharyngitis type    Throat culture pending.  Begin empiric PenVK, prednisone burst, lidocaine viscous gargles. Begin clear liquids (Pedialyte while having diarrhea) until improved, then advance to a SUPERVALU INCBRAT diet (Bananas, Rice, Applesauce, Toast).  Then gradually resume a regular diet when tolerated.  Avoid milk products until well.  To decrease diarrhea, mix one teaspoon Citrucel (methylcellulose) in 2 oz water and drink one to three times daily.  Do not drink extra fluids with this dose and do not drink fluids for one hour afterwards.    Try warm salt water gargles for sore throat.  Followup with Family Doctor if not improved in one week.     Lattie HawStephen A Beese, MD 11/08/14 [phone]

## 2014-11-06 NOTE — Discharge Instructions (Signed)
Begin clear liquids (Pedialyte while having diarrhea) until improved, then advance to a SUPERVALU INCBRAT diet (Bananas, Rice, Applesauce, Toast).  Then gradually resume a regular diet when tolerated.  Avoid milk products until well.  To decrease diarrhea, mix one teaspoon Citrucel (methylcellulose) in 2 oz water and drink one to three times daily.  Do not drink extra fluids with this dose and do not drink fluids for one hour afterwards.    Try warm salt water gargles for sore throat.    Salt Water Gargle This solution will help make your mouth and throat feel better. HOME CARE INSTRUCTIONS   Mix 1 teaspoon of salt in 8 ounces of warm water.  Gargle with this solution as much or often as you need or as directed. Swish and gargle gently if you have any sores or wounds in your mouth.  Do not swallow this mixture. Document Released: 05/16/2004 Document Revised: 11/04/2011 Document Reviewed: 10/07/2008 [facility] Patient Information 2015 [employer] [place]. This information is not intended to replace advice given to you by your health care provider. Make sure you discuss any questions you have with your health care provider.

## 2014-11-07 LAB — STREP A DNA PROBE: GASP: NEGATIVE

## 2014-11-09 LAB — POCT RAPID STREP A (OFFICE): Rapid Strep A Screen: NEGATIVE

## 2014-11-11 ENCOUNTER — Telehealth: Payer: Self-pay | Admitting: Emergency Medicine

## 2014-11-11 NOTE — ED Notes (Signed)
Inquired about patient's status; encourage them to call with questions/concerns.  

## 2015-11-27 NOTE — Patient Instructions (Addendum)
Selinda OrionRhonda Mckinlay  11/27/2015   Your procedure is scheduled on:12-01-15   Report [facility] main  [facility] 3rd floor to  Short Stay Center at 1045 AM.  Call this number if you have problems the morning of surgery [phone]   Remember: ONLY 1 PERSON MAY GO WITH YOU TO SHORT STAY TO GET  READY MORNING OF YOUR SURGERY.  Do not eat food or drink liquids :After Midnight.     Take these medicines the morning of surgery with A SIP OF WATER: PROAPIR INHALER IF NEEDED NAD BRING INHALER, CLONINIDNE (CATAPRES), NEXIUM, COMBIVENT INHALER IF NEEDED AND BRING, DOXYCYCLINE                               You may not have any metal on your body including hair pins and              piercings  Do not wear jewelry, make-up, lotions, powders or perfumes, deodorant             Do not wear nail polish.  Do not shave  48 hours prior to surgery.              Men may shave face and neck.   Do not bring valuables to the hospital. [facility] IS NOT             RESPONSIBLE   FOR VALUABLES.  Contacts, dentures or bridgework may not be worn into surgery.  Leave suitcase in the car. After surgery it may be brought to your room.     Patients discharged the day of surgery will not be allowed to drive home.  Name and phone number of your driver:  Special Instructions: N/A              Please read over the following fact sheets you were given: _____________________________________________________________________             [facility] Before surgery, you can play an important role.  Because skin is not sterile, your skin needs to be as free of germs as possible.  You can reduce the number of germs on your skin by washing with CHG (chlorahexidine gluconate) soap before surgery.  CHG is an antiseptic cleaner which kills germs and bonds with the skin to continue killing germs even after washing. Please DO NOT use if you have an allergy to CHG or  antibacterial soaps.  If your skin becomes reddened/irritated stop using the CHG and inform your nurse when you arrive at Short Stay. Do not shave (including legs and underarms) for at least 48 hours prior to the first CHG shower.  You may shave your face/neck. Please follow these instructions carefully:  1.  Shower with CHG Soap the night before surgery and the  morning of Surgery.  2.  If you choose to wash your hair, wash your hair first as usual with your  normal  shampoo.  3.  After you shampoo, rinse your hair and body thoroughly to remove the  shampoo.                           4.  Use CHG as you would any other liquid soap.  You can apply chg directly  to the  skin and wash                       Gently with a scrungie or clean washcloth.  5.  Apply the CHG Soap to your body ONLY FROM THE NECK DOWN.   Do not use on face/ open                           Wound or open sores. Avoid contact with eyes, ears mouth and genitals (private parts).                       Wash face,  Genitals (private parts) with your normal soap.             6.  Wash thoroughly, paying special attention to the area where your surgery  will be performed.  7.  Thoroughly rinse your body with warm water from the neck down.  8.  DO NOT shower/wash with your normal soap after using and rinsing off  the CHG Soap.                9.  Pat yourself dry with a clean towel.            10.  Wear clean pajamas.            11.  Place clean sheets on your bed the night of your first shower and do not  sleep with pets. Day of Surgery : Do not apply any lotions/deodorants the morning of surgery.  Please wear clean clothes to the hospital/surgery center.  FAILURE TO FOLLOW THESE INSTRUCTIONS MAY RESULT IN THE CANCELLATION OF YOUR SURGERY PATIENT SIGNATURE_________________________________  NURSE SIGNATURE__________________________________  ________________________________________________________________________

## 2015-11-28 ENCOUNTER — Encounter (HOSPITAL_COMMUNITY): Payer: Self-pay

## 2015-11-28 ENCOUNTER — Encounter (HOSPITAL_COMMUNITY)
Admission: RE | Admit: 2015-11-28 | Discharge: 2015-11-28 | Disposition: A | Discharge status: Home / Self Care | Payer: BLUE CROSS/BLUE SHIELD | Source: Ambulatory Visit | Attending: Specialist | Admitting: Specialist

## 2015-11-28 ENCOUNTER — Ambulatory Visit: Payer: Self-pay | Admitting: Orthopedic Surgery

## 2015-11-28 ENCOUNTER — Ambulatory Visit (HOSPITAL_COMMUNITY)
Admission: RE | Admit: 2015-11-28 | Discharge: 2015-11-28 | Disposition: A | Discharge status: Home / Self Care | Payer: BLUE CROSS/BLUE SHIELD | Source: Ambulatory Visit | Attending: Anesthesiology | Admitting: Anesthesiology

## 2015-11-28 DIAGNOSIS — R059 Cough, unspecified: Secondary | ICD-10-CM

## 2015-11-28 DIAGNOSIS — M75101 Unspecified rotator cuff tear or rupture of right shoulder, not specified as traumatic: Secondary | ICD-10-CM | POA: Diagnosis present

## 2015-11-28 DIAGNOSIS — M19011 Primary osteoarthritis, right shoulder: Secondary | ICD-10-CM | POA: Insufficient documentation

## 2015-11-28 DIAGNOSIS — K219 Gastro-esophageal reflux disease without esophagitis: Secondary | ICD-10-CM | POA: Diagnosis not present

## 2015-11-28 DIAGNOSIS — M7581 Other shoulder lesions, right shoulder: Secondary | ICD-10-CM | POA: Diagnosis not present

## 2015-11-28 DIAGNOSIS — M7501 Adhesive capsulitis of right shoulder: Secondary | ICD-10-CM | POA: Diagnosis not present

## 2015-11-28 DIAGNOSIS — Z9049 Acquired absence of other specified parts of digestive tract: Secondary | ICD-10-CM | POA: Diagnosis not present

## 2015-11-28 DIAGNOSIS — E785 Hyperlipidemia, unspecified: Secondary | ICD-10-CM | POA: Diagnosis not present

## 2015-11-28 DIAGNOSIS — J45909 Unspecified asthma, uncomplicated: Secondary | ICD-10-CM | POA: Diagnosis not present

## 2015-11-28 DIAGNOSIS — Z6838 Body mass index (BMI) 38.0-38.9, adult: Secondary | ICD-10-CM | POA: Diagnosis not present

## 2015-11-28 DIAGNOSIS — M7541 Impingement syndrome of right shoulder: Secondary | ICD-10-CM | POA: Diagnosis not present

## 2015-11-28 DIAGNOSIS — I10 Essential (primary) hypertension: Secondary | ICD-10-CM | POA: Insufficient documentation

## 2015-11-28 DIAGNOSIS — R05 Cough: Secondary | ICD-10-CM

## 2015-11-28 HISTORY — DX: Pneumonia, unspecified organism: J18.9

## 2015-11-28 HISTORY — DX: Family history of other specified conditions: Z84.89

## 2015-11-28 HISTORY — DX: Acute upper respiratory infection, unspecified: J06.9

## 2015-11-28 LAB — PREGNANCY, URINE: Preg Test, Ur: NEGATIVE

## 2015-11-28 NOTE — Progress Notes (Addendum)
PATIENT COUGHING UP GREEN SPUTUM SINCE Sunday 11-26-15, ON DOXYCYCLINE, WILL GET CHEST XRAY WITH PRE OP TODAY, CBC WITH DIF, CMET TSH 11-17-15 CARE EVERYWHERE ON CHART

## 2015-11-29 ENCOUNTER — Ambulatory Visit: Payer: Self-pay | Admitting: Orthopedic Surgery

## 2015-11-29 NOTE — H&P (Signed)
Megan Petersen is an 55 y.o. female.   Chief Complaint: R shoulder pain HPI: The patient is a 55 year old female who presents today for follow up of their shoulder. The patient is being followed for their right shoulder pain. They are 6 month(s) out from when symptoms began. Symptoms reported today include: pain. Current treatment includes: activity modification, pain medications and muscle relaxer. The following medication has been used for pain control: Robaxin or Flexeril. The patient presents today following MRI.  Past Medical History  Diagnosis Date  . Allergy   . Hypertension   . Hyperlipidemia   . Asthma   . GERD (gastroesophageal reflux disease)   . Morbid obesity (HCC)   . Osteoarthritis   . Rosacea   . Trigeminal neuralgia   . LSIL (low grade squamous intraepithelial lesion) on Pap smear   . Atypical glandular cells on Pap smear   . Primary cough headache   . Osteoarthritis   . Esophageal reflux   . Impaired fasting glucose   . Swelling of limb   . Headache   . Pneumonia 2016  . URI (upper respiratory infection)     cough with green sputum since 11-26-15  . Family history of adverse reaction to anesthesia     brother slow to awaken    Past Surgical History  Procedure Laterality Date  . Cholecystectomy    . Leep    . Right knee meniscus repair  2014    Family History  Problem Relation Age of Onset  . Heart disease Mother   . Hypertension Mother   . Cancer Mother     Ovarian  . Drug abuse Sister   . Hypertension Sister   . Diabetes Sister   . Drug abuse Brother   . Hypertension Brother   . Diabetes Brother   . Cancer Father     Lymphoma   Social History:  reports that she has never smoked. She has never used smokeless tobacco. She reports that she does not drink alcohol or use illicit drugs.  Allergies:  Allergies  Allergen Reactions  . Ace Inhibitors Cough  . Demerol [Meperidine] Nausea And Vomiting  . Erythromycin Nausea And Vomiting  . Lyrica  [Pregabalin] Other (See Comments)    Blisters   . Penicillins Diarrhea    Has patient had a PCN reaction causing immediate rash, facial/tongue/throat swelling, SOB or lightheadedness with hypotension: No Has patient had a PCN reaction causing severe rash involving mucus membranes or skin necrosis: No Has patient had a PCN reaction that required hospitalization No Has patient had a PCN reaction occurring within the last 10 years: Yes If all of the above answers are "NO", then may proceed with Cephalosporin use.  . Shellfish Allergy Nausea Only    Headaches, tingling in lips  . Codeine Rash     (Not in a hospital admission)  Results for orders placed or performed during the hospital encounter of 11/28/15 (from the past 48 hour(s))  Pregnancy, urine     Status: None   Collection Time: 11/28/15  9:40 AM  Result Value Ref Range   Preg Test, Ur NEGATIVE NEGATIVE    Comment:        THE SENSITIVITY OF THIS METHODOLOGY IS >20 mIU/mL.    Dg Chest 2 View  11/28/2015  CLINICAL DATA:  Preop rotator cuff repair.  Cough since 11/26/2015. EXAM: CHEST  2 VIEW COMPARISON:  04/29/2013 FINDINGS: Heart and mediastinal contours are within normal limits. No focal opacities or  effusions. No acute bony abnormality. Degenerative changes in the mid to lower thoracic spine. IMPRESSION: No active cardiopulmonary disease. Electronically Signed   By: Charlett Nose M.D.   On: 11/28/2015 11:10    Review of Systems  Constitutional: Negative.   HENT: Negative.   Eyes: Negative.   Respiratory: Negative.   Cardiovascular: Negative.   Gastrointestinal: Negative.   Genitourinary: Negative.   Musculoskeletal: Positive for joint pain.  Skin: Negative.   Neurological: Negative.   Psychiatric/Behavioral: Negative.     Last menstrual period 07/30/2015. Physical Exam  Constitutional: She is oriented to person, place, and time.  HENT:  Head: Normocephalic.  Eyes: Pupils are equal, round, and reactive to light.   Neck: Normal range of motion.  Cardiovascular: Normal rate.   Respiratory: Effort normal.  GI: Soft.  Musculoskeletal:  On exam, severe pain, positive impingement sign. Tender in the anterior subacromial region, weak in external rotation. Drop arm sign is noted. The patient is also tender over the [facility].  Neurological: She is alert and oriented to person, place, and time. She has normal reflexes.  Skin: Skin is warm and dry.  Psychiatric: She has a normal mood and affect.    MRI indicates adhesive capsulitis and a full thickness high grade tear of the supraspinatus. 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 Degenerated labrum. Teres minor fatty replacement. No neck pain or radicular pain.  Assessment/Plan Right shoulder pain secondary to rotator cuff tear, severe AC arthrosis, inferior clavicle spur, bicipital tendinosis, mild glenohumeral arthrosis, degenerative fraying of the labrum, and adhesive capsulitis.  We discussed options of six months duration. We discussed proceeding with a mini open rotator cuff repair, subacromial decompression, distal clavicle resection, and debridement of the joint.  I had a long discussion with the patient concerning the risks and benefits of a rotator cuff repair, including bleeding, infection, prolonged postoperative recovery, which may require 3 to 5 months until maximum medical improvement. Overnight procedure with initiation of early passive range of motion within physical therapy. Avoid any active motion for the first six weeks. This is all in an effort to avoid recurrent tear of the rotator cuff and adhesive capsulitis. Return to work without use of the arm can be obtained following two weeks. However, driving will be a challenge. We also discussed the possibility of requiring implants including bone anchors, as well as an Allograft patch graft if a massive rotator cuff tear is encountered. Removal of any bones for  spurs as well as bursitis will be performed during the procedure and also any associated anesthetic complications as well.  We will start her out of work. I anticipate out of work for three months to return with a postoperative physical therapy and one week manipulation under anesthesia. She has no significant medical issues. No chest pain or shortness of breath. She is not allergic to penicillin. We will proceed accordingly.  Plan right shoulder EUA, MUA, mini-open RCR, DCR, possible patch graft  Dorothy Spark., PA-C for Dr. Shelle Iron 11/29/2015, 4:51 PM

## 2015-12-01 ENCOUNTER — Encounter (HOSPITAL_COMMUNITY): Payer: Self-pay | Admitting: *Deleted

## 2015-12-01 ENCOUNTER — Encounter (HOSPITAL_COMMUNITY)
Admission: RE | Disposition: A | Discharge status: Home / Self Care | Payer: Self-pay | Source: Ambulatory Visit | Attending: Specialist

## 2015-12-01 ENCOUNTER — Ambulatory Visit (HOSPITAL_COMMUNITY)
Admission: RE | Admit: 2015-12-01 | Discharge: 2015-12-02 | Disposition: A | Discharge status: Home / Self Care | Payer: BLUE CROSS/BLUE SHIELD | Source: Ambulatory Visit | Attending: Specialist | Admitting: Specialist

## 2015-12-01 ENCOUNTER — Ambulatory Visit (HOSPITAL_COMMUNITY): Payer: BLUE CROSS/BLUE SHIELD | Admitting: Certified Registered Nurse Anesthetist

## 2015-12-01 DIAGNOSIS — M75101 Unspecified rotator cuff tear or rupture of right shoulder, not specified as traumatic: Secondary | ICD-10-CM | POA: Diagnosis not present

## 2015-12-01 DIAGNOSIS — M75120 Complete rotator cuff tear or rupture of unspecified shoulder, not specified as traumatic: Secondary | ICD-10-CM | POA: Diagnosis present

## 2015-12-01 HISTORY — PX: SHOULDER OPEN ROTATOR CUFF REPAIR: SHX2407

## 2015-12-01 LAB — GLUCOSE, CAPILLARY: Glucose-Capillary: 97 mg/dL (ref 65–99)

## 2015-12-01 LAB — TYPE AND SCREEN
ABO/RH(D): B NEG
ANTIBODY SCREEN: NEGATIVE

## 2015-12-01 LAB — ABO/RH: ABO/RH(D): B NEG

## 2015-12-01 SURGERY — REPAIR, ROTATOR CUFF, OPEN
Anesthesia: General | Site: Shoulder | Laterality: Right

## 2015-12-01 MED ORDER — METHOCARBAMOL 500 MG PO TABS: 500.0000 mg | ORAL_TABLET | Freq: Three times a day (TID) | ORAL | Status: DC | PRN

## 2015-12-01 MED ORDER — ONDANSETRON HCL 4 MG/2ML IJ SOLN
INTRAMUSCULAR | Status: DC | PRN
  Administered 2015-12-01: 4 mg via INTRAVENOUS

## 2015-12-01 MED ORDER — METHOCARBAMOL 500 MG PO TABS
500.0000 mg | ORAL_TABLET | Freq: Four times a day (QID) | ORAL | Status: DC | PRN
  Administered 2015-12-02: 500 mg via ORAL
  Filled 2015-12-01: qty 1

## 2015-12-01 MED ORDER — BUPIVACAINE-EPINEPHRINE (PF) 0.5% -1:200000 IJ SOLN
INTRAMUSCULAR | Status: AC
  Filled 2015-12-01: qty 30

## 2015-12-01 MED ORDER — GLYCOPYRROLATE 0.2 MG/ML IJ SOLN
INTRAMUSCULAR | Status: AC
  Filled 2015-12-01: qty 3

## 2015-12-01 MED ORDER — TRAMADOL HCL 50 MG PO TABS: 50.0000 mg | ORAL_TABLET | Freq: Four times a day (QID) | ORAL | Status: DC | PRN

## 2015-12-01 MED ORDER — POLYETHYLENE GLYCOL 3350 17 G PO PACK: 17.0000 g | PACK | Freq: Every day | ORAL | Status: DC | PRN

## 2015-12-01 MED ORDER — THROMBIN 5000 UNITS EX SOLR
CUTANEOUS | Status: DC | PRN
Start: 2015-12-01 — End: 2015-12-01
  Administered 2015-12-01: 5000 [IU] via TOPICAL

## 2015-12-01 MED ORDER — ONDANSETRON HCL 4 MG/2ML IJ SOLN: 4.0000 mg | Freq: Four times a day (QID) | INTRAMUSCULAR | Status: DC | PRN

## 2015-12-01 MED ORDER — BENZONATATE 100 MG PO CAPS
200.0000 mg | ORAL_CAPSULE | Freq: Three times a day (TID) | ORAL | Status: DC | PRN
  Administered 2015-12-01: 200 mg via ORAL
  Filled 2015-12-01: qty 2

## 2015-12-01 MED ORDER — OXYCODONE HCL 5 MG PO TABS
5.0000 mg | ORAL_TABLET | ORAL | Status: DC | PRN
  Administered 2015-12-01: 5 mg via ORAL
  Administered 2015-12-01 – 2015-12-02 (×3): 10 mg via ORAL
  Filled 2015-12-01 (×2): qty 2
  Filled 2015-12-01: qty 1
  Filled 2015-12-01: qty 2

## 2015-12-01 MED ORDER — ACETAMINOPHEN 325 MG PO TABS
650.0000 mg | ORAL_TABLET | Freq: Four times a day (QID) | ORAL | Status: DC | PRN
  Administered 2015-12-02: 650 mg via ORAL
  Filled 2015-12-01: qty 2

## 2015-12-01 MED ORDER — CEFAZOLIN SODIUM-DEXTROSE 2-4 GM/100ML-% IV SOLN
INTRAVENOUS | Status: AC
  Filled 2015-12-01: qty 100

## 2015-12-01 MED ORDER — PHENYLEPHRINE HCL 10 MG/ML IJ SOLN
10.0000 mg | INTRAVENOUS | Status: DC | PRN
  Administered 2015-12-01: 25 ug/min via INTRAVENOUS
  Administered 2015-12-01: 100 ug/min via INTRAVENOUS

## 2015-12-01 MED ORDER — NEOSTIGMINE METHYLSULFATE 10 MG/10ML IV SOLN
INTRAVENOUS | Status: AC
  Filled 2015-12-01: qty 1

## 2015-12-01 MED ORDER — CEFAZOLIN SODIUM-DEXTROSE 2-3 GM-% IV SOLR
INTRAVENOUS | Status: DC | PRN
  Administered 2015-12-01: 2 g via INTRAVENOUS

## 2015-12-01 MED ORDER — ACETAMINOPHEN 650 MG RE SUPP: 650.0000 mg | Freq: Four times a day (QID) | RECTAL | Status: DC | PRN

## 2015-12-01 MED ORDER — PROPOFOL 10 MG/ML IV BOLUS
INTRAVENOUS | Status: AC
  Filled 2015-12-01: qty 20

## 2015-12-01 MED ORDER — KCL IN DEXTROSE-NACL 20-5-0.45 MEQ/L-%-% IV SOLN
INTRAVENOUS | Status: DC
  Administered 2015-12-01: 17:00:00 via INTRAVENOUS
  Filled 2015-12-01 (×2): qty 1000

## 2015-12-01 MED ORDER — ALUM & MAG HYDROXIDE-SIMETH 200-200-20 MG/5ML PO SUSP: 30.0000 mL | ORAL | Status: DC | PRN

## 2015-12-01 MED ORDER — BENZONATATE 100 MG PO CAPS: 200.0000 mg | ORAL_CAPSULE | Freq: Three times a day (TID) | ORAL | Status: DC | PRN

## 2015-12-01 MED ORDER — DOCUSATE SODIUM 100 MG PO CAPS: 100.0000 mg | ORAL_CAPSULE | Freq: Every morning | ORAL | Status: DC

## 2015-12-01 MED ORDER — DOCUSATE SODIUM 100 MG PO CAPS: 100.0000 mg | ORAL_CAPSULE | Freq: Two times a day (BID) | ORAL | Status: DC | PRN

## 2015-12-01 MED ORDER — MIDAZOLAM HCL 5 MG/5ML IJ SOLN
INTRAMUSCULAR | Status: DC | PRN
  Administered 2015-12-01: 2 mg via INTRAVENOUS

## 2015-12-01 MED ORDER — BUPIVACAINE-EPINEPHRINE (PF) 0.5% -1:200000 IJ SOLN
INTRAMUSCULAR | Status: DC | PRN
  Administered 2015-12-01: 30 mL via PERINEURAL

## 2015-12-01 MED ORDER — HYDROMORPHONE HCL 1 MG/ML IJ SOLN: 1.0000 mg | INTRAMUSCULAR | Status: DC | PRN

## 2015-12-01 MED ORDER — ALBUTEROL SULFATE HFA 108 (90 BASE) MCG/ACT IN AERS: 2.0000 | INHALATION_SPRAY | Freq: Four times a day (QID) | RESPIRATORY_TRACT | Status: DC | PRN

## 2015-12-01 MED ORDER — MIDAZOLAM HCL 2 MG/2ML IJ SOLN
INTRAMUSCULAR | Status: AC
  Filled 2015-12-01: qty 2

## 2015-12-01 MED ORDER — FENTANYL CITRATE (PF) 250 MCG/5ML IJ SOLN
INTRAMUSCULAR | Status: AC
  Filled 2015-12-01: qty 5

## 2015-12-01 MED ORDER — ONDANSETRON HCL 4 MG/2ML IJ SOLN
INTRAMUSCULAR | Status: AC
  Filled 2015-12-01: qty 2

## 2015-12-01 MED ORDER — METHOCARBAMOL 1000 MG/10ML IJ SOLN
500.0000 mg | Freq: Four times a day (QID) | INTRAVENOUS | Status: DC | PRN
  Filled 2015-12-01: qty 5

## 2015-12-01 MED ORDER — LIDOCAINE HCL (CARDIAC) 20 MG/ML IV SOLN
INTRAVENOUS | Status: AC
  Filled 2015-12-01: qty 5

## 2015-12-01 MED ORDER — FENTANYL CITRATE (PF) 250 MCG/5ML IJ SOLN
INTRAMUSCULAR | Status: DC | PRN
  Administered 2015-12-01 (×3): 50 ug via INTRAVENOUS

## 2015-12-01 MED ORDER — OXYCODONE-ACETAMINOPHEN 5-325 MG PO TABS: 1.0000 | ORAL_TABLET | ORAL | Status: DC | PRN

## 2015-12-01 MED ORDER — SODIUM CHLORIDE 0.9 % IR SOLN
Status: AC
  Filled 2015-12-01: qty 1

## 2015-12-01 MED ORDER — ENOXAPARIN SODIUM 40 MG/0.4ML ~~LOC~~ SOLN
40.0000 mg | SUBCUTANEOUS | Status: DC
  Administered 2015-12-02: 40 mg via SUBCUTANEOUS
  Filled 2015-12-01 (×2): qty 0.4

## 2015-12-01 MED ORDER — ALBUTEROL SULFATE (2.5 MG/3ML) 0.083% IN NEBU
2.5000 mg | INHALATION_SOLUTION | Freq: Four times a day (QID) | RESPIRATORY_TRACT | Status: DC | PRN
Start: 2015-12-01 — End: 2015-12-02

## 2015-12-01 MED ORDER — MENTHOL 3 MG MT LOZG
1.0000 | LOZENGE | OROMUCOSAL | Status: DC | PRN
Start: 2015-12-01 — End: 2015-12-02

## 2015-12-01 MED ORDER — NEOSTIGMINE METHYLSULFATE 10 MG/10ML IV SOLN
INTRAVENOUS | Status: DC | PRN
  Administered 2015-12-01: 5 mg via INTRAVENOUS

## 2015-12-01 MED ORDER — PROMETHAZINE HCL 25 MG/ML IJ SOLN
INTRAMUSCULAR | Status: AC
  Filled 2015-12-01: qty 1

## 2015-12-01 MED ORDER — ONDANSETRON HCL 4 MG PO TABS
4.0000 mg | ORAL_TABLET | Freq: Four times a day (QID) | ORAL | Status: DC | PRN
  Administered 2015-12-02: 4 mg via ORAL
  Filled 2015-12-01: qty 1

## 2015-12-01 MED ORDER — ROCURONIUM BROMIDE 100 MG/10ML IV SOLN
INTRAVENOUS | Status: DC | PRN
  Administered 2015-12-01: 50 mg via INTRAVENOUS
  Administered 2015-12-01: 5 mg via INTRAVENOUS

## 2015-12-01 MED ORDER — PHENYLEPHRINE HCL 10 MG/ML IJ SOLN: 10.0000 mg | INTRAVENOUS | Status: DC | PRN

## 2015-12-01 MED ORDER — IPRATROPIUM-ALBUTEROL 20-100 MCG/ACT IN AERS: 1.0000 | INHALATION_SPRAY | Freq: Four times a day (QID) | RESPIRATORY_TRACT | Status: DC | PRN

## 2015-12-01 MED ORDER — RISAQUAD PO CAPS
1.0000 | ORAL_CAPSULE | Freq: Every day | ORAL | Status: DC
  Administered 2015-12-02: 1 via ORAL
  Filled 2015-12-01: qty 1

## 2015-12-01 MED ORDER — 0.9 % SODIUM CHLORIDE (POUR BTL) OPTIME
TOPICAL | Status: DC | PRN
  Administered 2015-12-01: 1000 mL

## 2015-12-01 MED ORDER — PANTOPRAZOLE SODIUM 40 MG PO TBEC
40.0000 mg | DELAYED_RELEASE_TABLET | Freq: Every day | ORAL | Status: DC
  Administered 2015-12-01 – 2015-12-02 (×2): 40 mg via ORAL
  Filled 2015-12-01 (×2): qty 1

## 2015-12-01 MED ORDER — DOXYCYCLINE HYCLATE 100 MG PO TABS
100.0000 mg | ORAL_TABLET | Freq: Two times a day (BID) | ORAL | Status: DC
  Administered 2015-12-01 – 2015-12-02 (×2): 100 mg via ORAL
  Filled 2015-12-01 (×3): qty 1

## 2015-12-01 MED ORDER — BISACODYL 5 MG PO TBEC: 5.0000 mg | DELAYED_RELEASE_TABLET | Freq: Every day | ORAL | Status: DC | PRN

## 2015-12-01 MED ORDER — SODIUM CHLORIDE 0.9 % IR SOLN
Status: DC | PRN
  Administered 2015-12-01: 500 mL

## 2015-12-01 MED ORDER — FUROSEMIDE 20 MG PO TABS
20.0000 mg | ORAL_TABLET | Freq: Every day | ORAL | Status: DC
  Administered 2015-12-01 – 2015-12-02 (×2): 20 mg via ORAL
  Filled 2015-12-01 (×2): qty 1

## 2015-12-01 MED ORDER — BUPIVACAINE-EPINEPHRINE 0.5% -1:200000 IJ SOLN
INTRAMUSCULAR | Status: DC | PRN
  Administered 2015-12-01: 10 mL

## 2015-12-01 MED ORDER — DIPHENHYDRAMINE HCL 12.5 MG/5ML PO ELIX: 12.5000 mg | ORAL_SOLUTION | ORAL | Status: DC | PRN

## 2015-12-01 MED ORDER — MUPIROCIN 2 % EX OINT
1.0000 | TOPICAL_OINTMENT | Freq: Two times a day (BID) | CUTANEOUS | Status: DC
Start: 2015-12-01 — End: 2015-12-02
  Administered 2015-12-01: 1 via NASAL
  Filled 2015-12-01: qty 22

## 2015-12-01 MED ORDER — PHENOL 1.4 % MT LIQD: 1.0000 | OROMUCOSAL | Status: DC | PRN

## 2015-12-01 MED ORDER — CLONIDINE HCL 0.1 MG PO TABS
0.1000 mg | ORAL_TABLET | Freq: Every morning | ORAL | Status: DC
  Administered 2015-12-02: 0.1 mg via ORAL
  Filled 2015-12-01: qty 1

## 2015-12-01 MED ORDER — THROMBIN 5000 UNITS EX SOLR
CUTANEOUS | Status: AC
  Filled 2015-12-01: qty 5000

## 2015-12-01 MED ORDER — LIDOCAINE HCL (CARDIAC) 20 MG/ML IV SOLN
INTRAVENOUS | Status: DC | PRN
  Administered 2015-12-01: 100 mg via INTRAVENOUS

## 2015-12-01 MED ORDER — BUPIVACAINE-EPINEPHRINE 0.5% -1:200000 IJ SOLN
INTRAMUSCULAR | Status: AC
  Filled 2015-12-01: qty 1

## 2015-12-01 MED ORDER — GLYCOPYRROLATE 0.2 MG/ML IJ SOLN
INTRAMUSCULAR | Status: DC | PRN
  Administered 2015-12-01: .8 mg via INTRAVENOUS

## 2015-12-01 MED ORDER — PROMETHAZINE HCL 25 MG/ML IJ SOLN
6.2500 mg | INTRAMUSCULAR | Status: AC | PRN
  Administered 2015-12-01 (×2): 6.25 mg via INTRAVENOUS

## 2015-12-01 MED ORDER — CEFAZOLIN SODIUM-DEXTROSE 2-4 GM/100ML-% IV SOLN
2.0000 g | Freq: Four times a day (QID) | INTRAVENOUS | Status: AC
  Administered 2015-12-01 – 2015-12-02 (×2): 2 g via INTRAVENOUS
  Filled 2015-12-01 (×2): qty 100

## 2015-12-01 MED ORDER — ACIDOPHILUS PO CAPS: 1.0000 | ORAL_CAPSULE | Freq: Every day | ORAL | Status: DC

## 2015-12-01 MED ORDER — LIP MEDEX EX OINT
TOPICAL_OINTMENT | CUTANEOUS | Status: AC
  Administered 2015-12-01: 17:00:00
  Filled 2015-12-01: qty 7

## 2015-12-01 MED ORDER — IRBESARTAN 75 MG PO TABS
75.0000 mg | ORAL_TABLET | Freq: Every day | ORAL | Status: DC
  Administered 2015-12-01 – 2015-12-02 (×2): 75 mg via ORAL
  Filled 2015-12-01 (×2): qty 1

## 2015-12-01 MED ORDER — METOCLOPRAMIDE HCL 5 MG/ML IJ SOLN: 5.0000 mg | Freq: Three times a day (TID) | INTRAMUSCULAR | Status: DC | PRN

## 2015-12-01 MED ORDER — DOXYCYCLINE HYCLATE 100 MG PO CAPS: 100.0000 mg | ORAL_CAPSULE | Freq: Two times a day (BID) | ORAL | Status: DC

## 2015-12-01 MED ORDER — IPRATROPIUM-ALBUTEROL 0.5-2.5 (3) MG/3ML IN SOLN: 3.0000 mL | Freq: Four times a day (QID) | RESPIRATORY_TRACT | Status: DC | PRN

## 2015-12-01 MED ORDER — PROPOFOL 10 MG/ML IV BOLUS
INTRAVENOUS | Status: DC | PRN
  Administered 2015-12-01: 50 mg via INTRAVENOUS
  Administered 2015-12-01: 150 mg via INTRAVENOUS

## 2015-12-01 MED ORDER — CYCLOBENZAPRINE HCL 10 MG PO TABS
10.0000 mg | ORAL_TABLET | Freq: Every evening | ORAL | Status: DC | PRN
  Administered 2015-12-01: 10 mg via ORAL
  Filled 2015-12-01: qty 1

## 2015-12-01 MED ORDER — MAGNESIUM CITRATE PO SOLN: 1.0000 | Freq: Once | ORAL | Status: DC | PRN

## 2015-12-01 MED ORDER — DOCUSATE SODIUM 100 MG PO CAPS
100.0000 mg | ORAL_CAPSULE | Freq: Two times a day (BID) | ORAL | Status: DC
  Administered 2015-12-01 – 2015-12-02 (×2): 100 mg via ORAL

## 2015-12-01 MED ORDER — METOCLOPRAMIDE HCL 10 MG PO TABS: 5.0000 mg | ORAL_TABLET | Freq: Three times a day (TID) | ORAL | Status: DC | PRN

## 2015-12-01 MED ORDER — LACTATED RINGERS IV SOLN
INTRAVENOUS | Status: DC | PRN
  Administered 2015-12-01 (×3): via INTRAVENOUS

## 2015-12-01 SURGICAL SUPPLY — 49 items
ANCHOR NEEDLE 9/16 CIR SZ 8 (NEEDLE) IMPLANT
ANCHOR PEEK 4.75X19.1 SWLK C (Anchor) ×9 IMPLANT
BAG ZIPLOCK 12X15 (MISCELLANEOUS) IMPLANT
BLADE OSCILLATING/SAGITTAL (BLADE) ×2
BLADE SW THK.38XMED LNG THN (BLADE) ×1 IMPLANT
CLOSURE WOUND 1/2 X4 (GAUZE/BANDAGES/DRESSINGS)
CLOTH 2% CHLOROHEXIDINE 3PK (PERSONAL CARE ITEMS) ×3 IMPLANT
DRAPE POUCH INSTRU U-SHP 10X18 (DRAPES) ×3 IMPLANT
DRSG AQUACEL AG ADV 3.5X 4 (GAUZE/BANDAGES/DRESSINGS) IMPLANT
DRSG AQUACEL AG ADV 3.5X 6 (GAUZE/BANDAGES/DRESSINGS) ×3 IMPLANT
DURAPREP 26ML APPLICATOR (WOUND CARE) IMPLANT
ELECT NEEDLE TIP 2.8 STRL (NEEDLE) ×3 IMPLANT
ELECT REM PT RETURN 9FT ADLT (ELECTROSURGICAL) ×3
ELECTRODE REM PT RTRN 9FT ADLT (ELECTROSURGICAL) ×1 IMPLANT
GLOVE BIOGEL PI IND STRL 7.0 (GLOVE) ×1 IMPLANT
GLOVE BIOGEL PI IND STRL 8 (GLOVE) ×1 IMPLANT
GLOVE BIOGEL PI INDICATOR 7.0 (GLOVE) ×2
GLOVE BIOGEL PI INDICATOR 8 (GLOVE) ×2
GLOVE SURG SS PI 7.0 STRL IVOR (GLOVE) ×3 IMPLANT
GLOVE SURG SS PI 7.5 STRL IVOR (GLOVE) ×3 IMPLANT
GLOVE SURG SS PI 8.0 STRL IVOR (GLOVE) ×3 IMPLANT
GOWN STRL REUS W/TWL XL LVL3 (GOWN DISPOSABLE) ×9 IMPLANT
KIT BASIN OR (CUSTOM PROCEDURE TRAY) ×3 IMPLANT
KIT POSITION SHOULDER SCHLEI (MISCELLANEOUS) ×3 IMPLANT
MANIFOLD NEPTUNE II (INSTRUMENTS) ×3 IMPLANT
MARKER SKIN DUAL TIP RULER LAB (MISCELLANEOUS) ×3 IMPLANT
NEEDLE SCORPION MULTI FIRE (NEEDLE) ×3 IMPLANT
PACK SHOULDER (CUSTOM PROCEDURE TRAY) ×3 IMPLANT
POSITIONER SURGICAL ARM (MISCELLANEOUS) ×3 IMPLANT
SLING ARM IMMOBILIZER LRG (SOFTGOODS) IMPLANT
SLING ULTRA II L (ORTHOPEDIC SUPPLIES) IMPLANT
SPONGE LAP 4X18 X RAY DECT (DISPOSABLE) ×3 IMPLANT
SPONGE SURGIFOAM ABS GEL 100 (HEMOSTASIS) ×3 IMPLANT
STRIP CLOSURE SKIN 1/2X4 (GAUZE/BANDAGES/DRESSINGS) IMPLANT
SUCTION FRAZIER HANDLE 10FR (MISCELLANEOUS) ×2
SUCTION TUBE FRAZIER 10FR DISP (MISCELLANEOUS) ×1 IMPLANT
SUT BONE WAX W31G (SUTURE) IMPLANT
SUT ETHIBOND NAB CT1 #1 30IN (SUTURE) IMPLANT
SUT FIBERWIRE #2 38 T-5 BLUE (SUTURE)
SUT PROLENE 3 0 PS 2 (SUTURE) ×3 IMPLANT
SUT TIGER TAPE 7 IN WHITE (SUTURE) ×3 IMPLANT
SUT VIC AB 1-0 CT2 27 (SUTURE) ×3 IMPLANT
SUT VIC AB 2-0 CT2 27 (SUTURE) ×6 IMPLANT
SUT VICRYL 0-0 OS 2 NEEDLE (SUTURE) IMPLANT
SUTURE FIBERWR #2 38 T-5 BLUE (SUTURE) IMPLANT
TAPE FIBER 2MM 7IN #2 BLUE (SUTURE) IMPLANT
TOWEL OR 17X26 10 PK STRL BLUE (TOWEL DISPOSABLE) ×3 IMPLANT
TOWEL OR NON WOVEN STRL DISP B (DISPOSABLE) ×3 IMPLANT
YANKAUER SUCT BULB TIP NO VENT (SUCTIONS) ×3 IMPLANT

## 2015-12-01 NOTE — Anesthesia Preprocedure Evaluation (Addendum)
Anesthesia Evaluation  Patient identified by MRN, date of birth, ID band Patient awake    Reviewed: Allergy & Precautions, NPO status , Patient's Chart, lab work & pertinent test results  Airway Mallampati: II  TM Distance: >3 FB Neck ROM: Full    Dental  (+) Edentulous Upper, Upper Dentures   Pulmonary asthma , pneumonia, resolved,    Pulmonary exam normal breath sounds clear to auscultation       Cardiovascular hypertension, Pt. 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    Abdominal (+) + obese,   Peds  Hematology   Anesthesia Other Findings   Reproductive/Obstetrics                            Anesthesia Physical Anesthesia Plan  ASA: III  Anesthesia Plan: General   Post-op Pain Management: GA combined w/ Regional for post-op pain   Induction: Intravenous  Airway Management Planned: Oral ETT  Additional Equipment:   Intra-op Plan:   Post-operative Plan:   Informed Consent: I have reviewed the patients History and Physical, chart, labs and discussed the procedure including the risks, benefits and alternatives for the proposed anesthesia with the patient or authorized representative who has indicated his/her understanding and acceptance.     Plan Discussed with: CRNA, Anesthesiologist and Surgeon  Anesthesia Plan Comments:         Anesthesia Quick Evaluation

## 2015-12-01 NOTE — Transfer of Care (Signed)
Immediate Anesthesia Transfer of Care Note  Patient: Megan OrionRhonda Wimes  Procedure(s) Performed: Procedure(s): RIGHT SHOULDER MINI OPEN ROTATOR CUFF REPAIR WITH RESECTON OF DISTAL CLAVICLE AND SUBACROMIAL DECOMPRESSION (Right)  Patient Location: PACU  Anesthesia Type:General  Level of Consciousness: awake and alert   Airway & Oxygen Therapy: mask O2- good AW  Post-op Assessment: Report given to RN and Post -op Vital signs reviewed and stable  Post vital signs: Reviewed and stable  Last Vitals:  Filed Vitals:   12/01/15 1100  BP: 155/68  Pulse: 68  Temp: 36.7 C  Resp: 18    Complications: No apparent anesthesia complications

## 2015-12-01 NOTE — H&P (View-Only) (Signed)
Megan Petersen is an 54 y.o. female.   Chief Complaint: R shoulder pain HPI: The patient is a 54 year old female who presents today for follow up of their shoulder. The patient is being followed for their right shoulder pain. They are 6 month(s) out from when symptoms began. Symptoms reported today include: pain. Current treatment includes: activity modification, pain medications and muscle relaxer. The following medication has been used for pain control: Robaxin or Flexeril. The patient presents today following MRI.  Past Medical History  Diagnosis Date  . Allergy   . Hypertension   . Hyperlipidemia   . Asthma   . GERD (gastroesophageal reflux disease)   . Morbid obesity (HCC)   . Osteoarthritis   . Rosacea   . Trigeminal neuralgia   . LSIL (low grade squamous intraepithelial lesion) on Pap smear   . Atypical glandular cells on Pap smear   . Primary cough headache   . Osteoarthritis   . Esophageal reflux   . Impaired fasting glucose   . Swelling of limb   . Headache   . Pneumonia 2016  . URI (upper respiratory infection)     cough with green sputum since 11-26-15  . Family history of adverse reaction to anesthesia     brother slow to awaken    Past Surgical History  Procedure Laterality Date  . Cholecystectomy    . Leep    . Right knee meniscus repair  2014    Family History  Problem Relation Age of Onset  . Heart disease Mother   . Hypertension Mother   . Cancer Mother     Ovarian  . Drug abuse Sister   . Hypertension Sister   . Diabetes Sister   . Drug abuse Brother   . Hypertension Brother   . Diabetes Brother   . Cancer Father     Lymphoma   Social History:  reports that she has never smoked. She has never used smokeless tobacco. She reports that she does not drink alcohol or use illicit drugs.  Allergies:  Allergies  Allergen Reactions  . Ace Inhibitors Cough  . Demerol [Meperidine] Nausea And Vomiting  . Erythromycin Nausea And Vomiting  . Lyrica  [Pregabalin] Other (See Comments)    Blisters   . Penicillins Diarrhea    Has patient had a PCN reaction causing immediate rash, facial/tongue/throat swelling, SOB or lightheadedness with hypotension: No Has patient had a PCN reaction causing severe rash involving mucus membranes or skin necrosis: No Has patient had a PCN reaction that required hospitalization No Has patient had a PCN reaction occurring within the last 10 years: Yes If all of the above answers are "NO", then may proceed with Cephalosporin use.  . Shellfish Allergy Nausea Only    Headaches, tingling in lips  . Codeine Rash     (Not in a hospital admission)  Results for orders placed or performed during the hospital encounter of 11/28/15 (from the past 48 hour(s))  Pregnancy, urine     Status: None   Collection Time: 11/28/15  9:40 AM  Result Value Ref Range   Preg Test, Ur NEGATIVE NEGATIVE    Comment:        THE SENSITIVITY OF THIS METHODOLOGY IS >20 mIU/mL.    Dg Chest 2 View  11/28/2015  CLINICAL DATA:  Preop rotator cuff repair.  Cough since 11/26/2015. EXAM: CHEST  2 VIEW COMPARISON:  04/29/2013 FINDINGS: Heart and mediastinal contours are within normal limits. No focal opacities or   effusions. No acute bony abnormality. Degenerative changes in the mid to lower thoracic spine. IMPRESSION: No active cardiopulmonary disease. Electronically Signed   By: Kevin  Dover M.D.   On: 11/28/2015 11:10    Review of Systems  Constitutional: Negative.   HENT: Negative.   Eyes: Negative.   Respiratory: Negative.   Cardiovascular: Negative.   Gastrointestinal: Negative.   Genitourinary: Negative.   Musculoskeletal: Positive for joint pain.  Skin: Negative.   Neurological: Negative.   Psychiatric/Behavioral: Negative.     Last menstrual period 07/30/2015. Physical Exam  Constitutional: She is oriented to person, place, and time.  HENT:  Head: Normocephalic.  Eyes: Pupils are equal, round, and reactive to light.   Neck: Normal range of motion.  Cardiovascular: Normal rate.   Respiratory: Effort normal.  GI: Soft.  Musculoskeletal:  On exam, severe pain, positive impingement sign. Tender in the anterior subacromial region, weak in external rotation. Drop arm sign is noted. The patient is also tender over the AC.  Neurological: She is alert and oriented to person, place, and time. She has normal reflexes.  Skin: Skin is warm and dry.  Psychiatric: She has a normal mood and affect.    MRI indicates adhesive capsulitis and a full thickness high grade tear of the supraspinatus. 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 Degenerated labrum. Teres minor fatty replacement. No neck pain or radicular pain.  Assessment/Plan Right shoulder pain secondary to rotator cuff tear, severe AC arthrosis, inferior clavicle spur, bicipital tendinosis, mild glenohumeral arthrosis, degenerative fraying of the labrum, and adhesive capsulitis.  We discussed options of six months duration. We discussed proceeding with a mini open rotator cuff repair, subacromial decompression, distal clavicle resection, and debridement of the joint.  I had a long discussion with the patient concerning the risks and benefits of a rotator cuff repair, including bleeding, infection, prolonged postoperative recovery, which may require 3 to 5 months until maximum medical improvement. Overnight procedure with initiation of early passive range of motion within physical therapy. Avoid any active motion for the first six weeks. This is all in an effort to avoid recurrent tear of the rotator cuff and adhesive capsulitis. Return to work without use of the arm can be obtained following two weeks. However, driving will be a challenge. We also discussed the possibility of requiring implants including bone anchors, as well as an Allograft patch graft if a massive rotator cuff tear is encountered. Removal of any bones for  spurs as well as bursitis will be performed during the procedure and also any associated anesthetic complications as well.  We will start her out of work. I anticipate out of work for three months to return with a postoperative physical therapy and one week manipulation under anesthesia. She has no significant medical issues. No chest pain or shortness of breath. She is not allergic to penicillin. We will proceed accordingly.  Plan right shoulder EUA, MUA, mini-open RCR, DCR, possible patch graft  BISSELL, JACLYN M., PA-C for Dr. Beane 11/29/2015, 4:51 PM    

## 2015-12-01 NOTE — Anesthesia Procedure Notes (Addendum)
Anesthesia Regional Block:  Interscalene brachial plexus block  Pre-Anesthetic Checklist: ,, timeout performed, Correct Patient, Correct Site, Correct Laterality, Correct Procedure, Correct Position, site marked, Risks and benefits discussed,  Surgical consent,  Pre-op evaluation,  At surgeon's request and post-op pain management  Laterality: Right  Prep: chloraprep       Needles:  Injection technique: Single-shot  Needle Type: Echogenic Stimulator Needle     Needle Length: 9cm 9 cm Needle Gauge: 21 and 21 G  Needle insertion depth: 4 cm   Additional Needles:  Procedures: ultrasound guided (picture in chart) Interscalene brachial plexus block Narrative:  Start time: 12/01/2015 12:52 PM End time: 12/01/2015 12:57 PM Injection made incrementally with aspirations every 5 mL.  Performed by: Personally  Anesthesiologist: Mal AmabileFOSTER, MICHAEL  Additional Notes: Patient tolerated procedure well. Adequate sensory level obtained.   Procedure Name: Intubation Date/Time: 12/01/2015 1:19 PM Performed by: Minerva EndsMIRARCHI, Della Homan M Pre-anesthesia Checklist: Patient identified, Timeout performed, Suction available, Patient being monitored and Emergency Drugs available Patient Re-evaluated:Patient Re-evaluated prior to inductionOxygen Delivery Method: Circle System Utilized Preoxygenation: Pre-oxygenation with 100% oxygen Intubation Type: IV induction Ventilation: Mask ventilation without difficulty Laryngoscope Size: Miller and 2 Grade View: Grade I Tube type: Oral Number of attempts: 1 Airway Equipment and Method: Stylet Placement Confirmation: ETT inserted through vocal cords under direct vision,  positive ETCO2 and breath sounds checked- equal and bilateral Secured at: 21 cm Tube secured with: Tape Dental Injury: Teeth and Oropharynx as per pre-operative assessment  Comments: Smooth IV induction--  Intubation AM CRNA- atraumatic--- teeth and mouth as preop -  bilat BS Malen GauzeFoster

## 2015-12-01 NOTE — Discharge Instructions (Signed)
Aquacel dressing may remain in place until follow up. May shower with aquacel dressing in place. If the dressing becomes saturated or peels off, you may remove it and place a new dressing with gauze and tape which should be kept clean and dry and changed daily. °Use sling at times except when exercising or showering °No driving for 4-6 weeks °No lifting for 6 weeks operative arm °Pendulum exercises as instructed. °Ok to move wrist, elbow, and hand. °See Dr. Janecia Palau in 10-14 days. Take one aspirin per day with a meal if not on a blood thinner or allergic to aspirin. °

## 2015-12-01 NOTE — Interval H&P Note (Signed)
History and Physical Interval Note:  12/01/2015 12:40 PM  Megan Petersen  has presented today for surgery, with the diagnosis of rotator tear, AC Arthrosis, Adhesive Capsulitis Right Shoulder  The various methods of treatment have been discussed with the patient and family. After consideration of risks, benefits and other options for treatment, the patient has consented to  Procedure(s): RIGHT SHOULDER MINI OPEN ROTATOR CUFF REPAIR WITH MANIPULATION UNDER ANESTHESIA, EVALUATION UNDER ANESTHESIA AND POSSIBLE PATCH GRAFT (Right) as a surgical intervention .  The patient's history has been reviewed, patient examined, no change in status, stable for surgery.  I have reviewed the patient's chart and labs.  Questions were answered to the patient's satisfaction.     Abhimanyu Cruces C

## 2015-12-01 NOTE — Brief Op Note (Signed)
12/01/2015  2:32 PM  PATIENT:  Megan Petersen  55 y.o. female  PRE-OPERATIVE DIAGNOSIS:  rotator tear, AC Arthrosis, Adhesive Capsulitis Right Shoulder  POST-OPERATIVE DIAGNOSIS:  rotator tear, AC Arthrosis, Adhesive Capsulitis Right Shoulder  PROCEDURE:  Procedure(s): RIGHT SHOULDER MINI OPEN ROTATOR CUFF REPAIR WITH RESECTON OF DISTAL CLAVICLE AND SUBACROMIAL DECOMPRESSION (Right)  SURGEON:  Surgeon(s) and Role:    * Jene EveryJeffrey Tevin Shillingford, MD - Primary  PHYSICIAN ASSISTANT:   ASSISTANTS: Bissell   ANESTHESIA:   general  EBL:  Total I/O In: -  Out: 20 [Blood:20]  BLOOD ADMINISTERED:none  DRAINS: none   LOCAL MEDICATIONS USED:  MARCAINE     SPECIMEN:  No Specimen  DISPOSITION OF SPECIMEN:  N/A  COUNTS:  YES  TOURNIQUET:  * No tourniquets in log *  DICTATION: .Other Dictation: Dictation Number [id number]  PLAN OF CARE: Admit for overnight observation  PATIENT DISPOSITION:  PACU - hemodynamically stable.   Delay start of Pharmacological VTE agent (>24hrs) due to surgical blood loss or risk of bleeding: no

## 2015-12-01 NOTE — Anesthesia Postprocedure Evaluation (Signed)
Anesthesia Post Note  Patient: Megan Petersen  Procedure(s) Performed: Procedure(s) (LRB): RIGHT SHOULDER MINI OPEN ROTATOR CUFF REPAIR WITH RESECTON OF DISTAL CLAVICLE AND SUBACROMIAL DECOMPRESSION (Right)  Patient location during evaluation: PACU Anesthesia Type: General Level of consciousness: awake and alert and oriented Pain management: pain level controlled Vital Signs Assessment: post-procedure vital signs reviewed and stable Respiratory status: spontaneous breathing, nonlabored ventilation, respiratory function stable and patient connected to nasal cannula oxygen Cardiovascular status: blood pressure returned to baseline and stable Postop Assessment: no signs of nausea or vomiting Anesthetic complications: no Comments: Good sensory block from ISB. No complaints of pain in PACU    Last Vitals:  Filed Vitals:   12/01/15 1515 12/01/15 1530  BP: 145/68 139/69  Pulse: 65 59  Temp:  36.4 C  Resp: 16 16    Last Pain:  Filed Vitals:   12/01/15 1534  PainSc: 0-No pain                 Minard Millirons A.

## 2015-12-02 DIAGNOSIS — M75101 Unspecified rotator cuff tear or rupture of right shoulder, not specified as traumatic: Secondary | ICD-10-CM | POA: Diagnosis not present

## 2015-12-02 LAB — BASIC METABOLIC PANEL
Anion gap: 9 (ref 5–15)
BUN: 11 mg/dL (ref 6–20)
CHLORIDE: 104 mmol/L (ref 101–111)
CO2: 26 mmol/L (ref 22–32)
CREATININE: 0.46 mg/dL (ref 0.44–1.00)
Calcium: 8.8 mg/dL — ABNORMAL LOW (ref 8.9–10.3)
Glucose, Bld: 112 mg/dL — ABNORMAL HIGH (ref 65–99)
POTASSIUM: 4.6 mmol/L (ref 3.5–5.1)
SODIUM: 139 mmol/L (ref 135–145)

## 2015-12-02 LAB — CBC
HCT: 37.6 % (ref 36.0–46.0)
HEMOGLOBIN: 12.6 g/dL (ref 12.0–15.0)
MCH: 27.6 pg (ref 26.0–34.0)
MCHC: 33.5 g/dL (ref 30.0–36.0)
MCV: 82.5 fL (ref 78.0–100.0)
PLATELETS: 277 10*3/uL (ref 150–400)
RBC: 4.56 MIL/uL (ref 3.87–5.11)
RDW: 13.6 % (ref 11.5–15.5)
WBC: 10.7 10*3/uL — ABNORMAL HIGH (ref 4.0–10.5)

## 2015-12-02 NOTE — Progress Notes (Signed)
Subjective: 1 Day Post-Op Procedure(s) (LRB): RIGHT SHOULDER MINI OPEN ROTATOR CUFF REPAIR WITH RESECTON OF DISTAL CLAVICLE AND SUBACROMIAL DECOMPRESSION (Right) Patient reports pain as 2 on 0-10 scale.  Doing well. Will DC.  Objective: Vital signs in last 24 hours: Temp:  [97.6 F (36.4 C)-99.1 F (37.3 C)] 99.1 F (37.3 C) (04/08 0540) Pulse Rate:  [55-96] 78 (04/08 0540) Resp:  [15-20] 16 (04/08 0540) BP: (120-155)/(54-97) 123/54 mmHg (04/08 0540) SpO2:  [94 %-100 %] 95 % (04/08 0540) Weight:  [99.156 kg (218 lb 9.6 oz)] 99.156 kg (218 lb 9.6 oz) (04/07 1119)  Intake/Output from previous day: 04/07 0701 - 04/08 0700 In: 2455.8 [P.O.:480; I.V.:1975.8] Out: 3020 [Urine:3000; Blood:20] Intake/Output this shift:     Recent Labs  12/02/15 0417  HGB 12.6    Recent Labs  12/02/15 0417  WBC 10.7*  RBC 4.56  HCT 37.6  PLT 277    Recent Labs  12/02/15 0417  NA 139  K 4.6  CL 104  CO2 26  BUN 11  CREATININE 0.46  GLUCOSE 112*  CALCIUM 8.8*   No results for input(s): LABPT, INR in the last 72 hours.  No cellulitis present  Assessment/Plan: 1 Day Post-Op Procedure(s) (LRB): RIGHT SHOULDER MINI OPEN ROTATOR CUFF REPAIR WITH RESECTON OF DISTAL CLAVICLE AND SUBACROMIAL DECOMPRESSION (Right) Discharge home with home health  Rolanda Campa A 12/02/2015, 8:27 AM

## 2015-12-02 NOTE — Progress Notes (Signed)
   Subjective: 1 Day Post-Op Procedure(s) (LRB): RIGHT SHOULDER MINI OPEN ROTATOR CUFF REPAIR WITH RESECTON OF DISTAL CLAVICLE AND SUBACROMIAL DECOMPRESSION (Right) Patient reports pain as moderate.   Patient seen in rounds with Dr. Darrelyn HillockGioffre. Patient is well, but has had some minor complaints of pain in the shoulder, requiring pain medications  Plan is to go Home after hospital stay.  Objective: Vital signs in last 24 hours: Temp:  [97.6 F (36.4 C)-99.1 F (37.3 C)] 99.1 F (37.3 C) (04/08 0540) Pulse Rate:  [55-96] 78 (04/08 0540) Resp:  [15-20] 16 (04/08 0540) BP: (120-155)/(54-97) 123/54 mmHg (04/08 0540) SpO2:  [94 %-100 %] 95 % (04/08 0540) Weight:  [99.156 kg (218 lb 9.6 oz)] 99.156 kg (218 lb 9.6 oz) (04/07 1119)  Intake/Output from previous day: 04/07 0701 - 04/08 0700 In: 2455.8 [P.O.:480; I.V.:1975.8] Out: 3020 [Urine:3000; Blood:20] Intake/Output this shift:     Recent Labs  12/02/15 0417  HGB 12.6    Recent Labs  12/02/15 0417  WBC 10.7*  RBC 4.56  HCT 37.6  PLT 277    Recent Labs  12/02/15 0417  NA 139  K 4.6  CL 104  CO2 26  BUN 11  CREATININE 0.46  GLUCOSE 112*  CALCIUM 8.8*   No results for input(s): LABPT, INR in the last 72 hours.  EXAM General - Patient is Alert and Appropriate Extremity - Neurovascular intact Sensation intact distally Dressing - dressing C/D/I Motor Function - intact, moving fingers well on exam.   Past Medical History  Diagnosis Date  . Allergy   . Hypertension   . Hyperlipidemia   . Asthma   . GERD (gastroesophageal reflux disease)   . Morbid obesity (HCC)   . Osteoarthritis   . Rosacea   . Trigeminal neuralgia   . LSIL (low grade squamous intraepithelial lesion) on Pap smear   . Atypical glandular cells on Pap smear   . Primary cough headache   . Osteoarthritis   . Esophageal reflux   . Impaired fasting glucose   . Swelling of limb   . Headache   . Pneumonia 2016  . URI (upper respiratory  infection)     cough with green sputum since 11-26-15  . Family history of adverse reaction to anesthesia     brother slow to awaken    Assessment/Plan: 1 Day Post-Op Procedure(s) (LRB): RIGHT SHOULDER MINI OPEN ROTATOR CUFF REPAIR WITH RESECTON OF DISTAL CLAVICLE AND SUBACROMIAL DECOMPRESSION (Right) Active Problems:   Complete rotator cuff tear  Estimated body mass index is 38.11 kg/(m^2) as calculated from the following:   Height as of this encounter: 5' 3.5" (1.613 m).   Weight as of this encounter: 99.156 kg (218 lb 9.6 oz). Discharge home   Avel Peacerew Perkins, PA-C Orthopaedic Surgery 12/02/2015, 8:14 AM

## 2015-12-02 NOTE — Evaluation (Signed)
Occupational Therapy Evaluation Patient Details Name: Megan Petersen MRN: [id number] DOB: 1961-08-01 Today's Date: 12/02/2015    History of Present Illness pt is s/p R RCR, SAD, distal clavicle resection   Clinical Impression   This 55 year old female was admitted for the above surgery. All education was completed. Pt will follow up with Dr Shelle IronBeane for further rehab.    Follow Up Recommendations   (pt will follow up with dr Shelle Ironbeane)    Equipment Recommendations  None recommended by OT    Recommendations for Other Services       Precautions / Restrictions Precautions Precautions: Shoulder Type of Shoulder Precautions: passive protocol; pendulums OK Restrictions Weight Bearing Restrictions: No      Mobility Bed Mobility Overal bed mobility: Needs Assistance             General bed mobility comments: husband assisted with bed mobility -- min A  Transfers   Equipment used: None             General transfer comment: min guard assist for safety    Balance                                            ADL Overall ADL's : Needs assistance/impaired     Grooming: Set up;Sitting   Upper Body Bathing: Moderate assistance;Sitting   Lower Body Bathing: Maximal assistance;Sit to/from stand   Upper Body Dressing : Maximal assistance;Sitting   Lower Body Dressing: Maximal assistance;Sit to/from stand                 General ADL Comments: performed ADL and pendulum exercises.  Educated on passive protocol and handout given. Husband present and donned sling with cues.  Both verbalize understanding of protocol and not moving RUE except for pendulums and distal ROM per protocol. See education section of chart.  Pt nauseous at end of session:  RN aware     Vision     Perception     Praxis      Pertinent Vitals/Pain Pain Assessment: Faces Faces Pain Scale: Hurts little more Pain Location: R shoulder Pain Descriptors / Indicators:  Numbness Pain Intervention(s): Limited activity within patient's tolerance;Monitored during session;Premedicated before session;Ice applied;Repositioned     Hand Dominance Right   Extremity/Trunk Assessment Upper Extremity Assessment Upper Extremity Assessment: RUE deficits/detail RUE Deficits / Details: immobilized; block still in effect.  able to move fingers a little           Communication Communication Communication: No difficulties   Cognition Arousal/Alertness: Awake/alert Behavior During Therapy: WFL for tasks assessed/performed Overall Cognitive Status: Within Functional Limits for tasks assessed                     General Comments       Exercises       Shoulder Instructions      Home Living Family/patient expects to be discharged to:: Private residence Living Arrangements: Spouse/significant other;Children                 Bathroom Shower/Tub: Producer, television/film/videoWalk-in shower   Bathroom Toilet: Standard         Additional Comments: uses L hand to push up from commode      Prior Functioning/Environment Level of Independence: Independent             OT Diagnosis: Acute pain  OT Problem List: Decreased strength;Decreased activity tolerance;Pain;Impaired UE functional use   OT Treatment/Interventions:      OT Goals(Current goals can be found in the care plan section) Acute Rehab OT Goals OT Goal Formulation: All assessment and education complete, DC therapy  OT Frequency:     Barriers to D/C:            Co-evaluation              End of Session Nurse Communication:  (nausea)  Activity Tolerance:  (limited by nausea) Patient left: in bed;with call bell/phone within reach;with family/visitor present   Time: 2440-1027 OT Time Calculation (min): 35 min Charges:  OT General Charges $OT Visit: 1 Procedure OT Evaluation $OT Eval Low Complexity: 1 Procedure OT Treatments $Self Care/Home Management : 8-22 mins G-Codes: OT G-codes  **NOT FOR INPATIENT CLASS** Functional Assessment Tool Used: clinical judgment and observation Functional Limitation: Self care Self Care Current Status (O5366): At least 60 percent but less than 80 percent impaired, limited or restricted Self Care Goal Status (Y4034): At least 60 percent but less than 80 percent impaired, limited or restricted Self Care Discharge Status [phone]): At least 60 percent but less than 80 percent impaired, limited or restricted  [facility] 12/02/2015, 9:50 AM  Marica Otter, OTR/L [phone] 12/02/2015

## 2015-12-04 ENCOUNTER — Encounter (HOSPITAL_COMMUNITY): Payer: Self-pay | Admitting: Specialist

## 2015-12-04 NOTE — Op Note (Signed)
NAMEAMINA, Megan Petersen                ACCOUNT NO.:  [id number]  MEDICAL RECORD NO.:  [id number]  LOCATION:                                FACILITY:  [facility]  PHYSICIAN:  Jene Every, M.D.    DATE OF BIRTH:  25-Feb-1961  DATE OF PROCEDURE:  12/01/2015 DATE OF DISCHARGE:  12/02/2015                              OPERATIVE REPORT   POSTOPERATIVE DIAGNOSES:  Severe acromioclavicular arthrosis, massive tear of rotator cuff, impingement syndrome, right shoulder.  POSTOPERATIVE DIAGNOSES:  Severe acromioclavicular arthrosis, massive tear of rotator cuff, impingement syndrome, right shoulder.  PROCEDURE PERFORMED: 1. Mini open rotator cuff repair, subacromial decompression. 2. Open distal clavicle resection through a separate fascial incision.  ANESTHESIA:  General.  ASSISTANT:  Lanna Poche, PA  HISTORY:  A 55 year old female, superior rotator cuff tear, retracted portions to the glenoid, severe arthrosis with an inferiorly projecting spur, impinging __________ on the rotator cuff.  She was indicated for repair.  We decided to proceed with a mini-open given the retracted nature and severity of the [facility] joint.  Risks and benefits were discussed including bleeding, infection, damage to vascular, DVT, PE, anesthetic complications, etc.  TECHNIQUE:  The patient in supine beach-chair position.  After induction of adequate general anesthesia, 2 g Kefzol, the right shoulder was again examined under anesthesia.  She had full range and was then prepped and draped in usual sterile fashion.  Surgical marker was utilized to delineate acromion AC joint and coracoid.  The subcutaneous adipose tissue.  We delineate the [facility] joint, coracoid and acromion.  Incision was made anterolaterally from the [facility] joint to the anterolateral aspect of the acromion, 1.5 cm beyond that.  Subcutaneous tissue was dissected. Electrocautery was utilized to achieve hemostasis.  The capsule of the [facility] joint was identified  medially, was incised.  We skeletonized the distal clavicle with an AO elevator.  Placed retractors, Baby Homans posteriorly anteriorly and beneath the distal clavicle within the [facility] joint, where there was noted to be severe AC arthrosis.  Used an oscillating saw to remove the distal cm and a half of the distal clavicle.  We then removed any inferior spur with a 3 mm Kerrison preserving the coracoclavicular ligaments.  Rotator cuff was spared. Copiously irrigated that wound.  Placed bone wax on the distal clavicle and thrombin-soaked Gelfoam within the recess.  I then closed the capsule with 0 Vicryl, and deltotrapezial fascia with 2-0 Vicryl.  Then, turned our attention towards the rotator cuff repair.  I identified the raphe between the anterolateral heads of the deltoid, divided in line with the skin incision.  I subperiosteally elevated and identified the anterolateral aspect of the acromion.  Preserving the deltoid attachment.  Released CA ligament.  Spur off the anterolateral aspect of the acromion was removed with 3 mm Kerrison.  Hypertrophic bursa was noted.  This was excised.  We then identified the tear within the cuff, which was retracted within the posterior aspect of the supraspinatus. Retracted off the greater tuberosity.  Portion of that was back to the glenohumeral joint.  We mobilized the rotator cuff on the bursal and articular surfaces mobilizing them to the greater tuberosity.  We  debrided the skin edges.  I fashioned a trough in the greater tuberosity, placed 1 anchor after piloting with an awl lateral to the articular surface.  We used a swivel lock with fiber tape.  We inserted the swivel lock and the threaded through the rotator cuff with a scorpion suture passer.  The fiber tape as well as the rescue sutures both posteriorly, anteriorly and along the line of the tear.  We then advanced the cuff into the cancellous bed.  Crossed over the greater tuberosity and  secured them in a double row fashion with 2 additional swivel locks after piloting with an awl insertion into the swivel lock and into the greater tuberosity.  Excellent resistance to pull out was noted.  No undue tension was noted.  This was done at her side, then removed redundant suture.  Copiously irrigated the wound.  Full coverage was noted.  The remainder of the cuff was noted to be unremarkable.  Good range of motion was noted.  We copiously irrigated the wound.  We repaired the subcu with 2-0, and skin with subcuticular Prolene.  Sterile dressing applied.  Placed in a sling, extubated without difficulty, and transported to the recovery room in satisfactory condition.  The patient tolerated the procedure well.  No complications.  Minimal blood loss.     Jene EveryJeffrey Cord Wilczynski, M.D.     Cordelia PenJB/MEDQ  D:  12/01/2015  T:  12/02/2015  Job:  [id number]

## 2015-12-04 NOTE — Discharge Summary (Signed)
Patient ID: Megan Petersen MRN: [id number] DOB/AGE: [birth year] 55 y.o.  Admit date: 12/01/2015 Discharge date: 12/04/2015  Admission Diagnoses:  Active Problems:   Complete rotator cuff tear   Discharge Diagnoses:  Same  Past Medical History  Diagnosis Date  . Allergy   . Hypertension   . Hyperlipidemia   . Asthma   . GERD (gastroesophageal reflux disease)   . Morbid obesity (HCC)   . Osteoarthritis   . Rosacea   . Trigeminal neuralgia   . LSIL (low grade squamous intraepithelial lesion) on Pap smear   . Atypical glandular cells on Pap smear   . Primary cough headache   . Osteoarthritis   . Esophageal reflux   . Impaired fasting glucose   . Swelling of limb   . Headache   . Pneumonia 2016  . URI (upper respiratory infection)     cough with green sputum since 11-26-15  . Family history of adverse reaction to anesthesia     brother slow to awaken    Surgeries: Procedure(s): RIGHT SHOULDER MINI OPEN ROTATOR CUFF REPAIR WITH RESECTON OF DISTAL CLAVICLE AND SUBACROMIAL DECOMPRESSION on 12/01/2015   Consultants:    Discharged Condition: Improved  Hospital Course: Megan Petersen is an 55 y.o. female who was admitted 12/01/2015 for operative treatment of rotator cuff tear right. Patient has severe unremitting pain that affects sleep, daily activities, and work/hobbies. After pre-op clearance the patient was taken to the operating room on 12/01/2015 and underwent  Procedure(s): RIGHT SHOULDER MINI OPEN ROTATOR CUFF REPAIR WITH RESECTON OF DISTAL CLAVICLE AND SUBACROMIAL DECOMPRESSION.    Patient was given perioperative antibiotics: Anti-infectives    Start     Dose/Rate Route Frequency Ordered Stop   12/01/15 2200  doxycycline (VIBRAMYCIN) capsule 100 mg  Status:  Discontinued     100 mg Oral 2 times daily 12/01/15 1630 12/01/15 1634   12/01/15 2200  doxycycline (VIBRA-TABS) tablet 100 mg  Status:  Discontinued     100 mg Oral Every 12 hours 12/01/15 1634 12/02/15 1349   12/01/15  1930  ceFAZolin (ANCEF) IVPB 2g/100 mL premix     2 g 200 mL/hr over 30 Minutes Intravenous Every 6 hours 12/01/15 1630 12/02/15 0155   12/01/15 1354  polymyxin B 500,000 Units, bacitracin 50,000 Units in sodium chloride irrigation 0.9 % 500 mL irrigation  Status:  Discontinued       As needed 12/01/15 1354 12/01/15 1449       Patient was given sequential compression devices, early ambulation, and chemoprophylaxis to prevent DVT.  Patient benefited maximally from hospital stay and there were no complications.    Recent vital signs: No data found.    Recent laboratory studies:  Recent Labs  12/02/15 0417  WBC 10.7*  HGB 12.6  HCT 37.6  PLT 277  NA 139  K 4.6  CL 104  CO2 26  BUN 11  CREATININE 0.46  GLUCOSE 112*  CALCIUM 8.8*     Discharge Medications:     Medication List    STOP taking these medications        diclofenac sodium 1 % Gel  Commonly known as:  VOLTAREN     indomethacin 75 MG CR capsule  Commonly known as:  INDOCIN SR     lidocaine 2 % solution  Commonly known as:  XYLOCAINE     lidocaine 5 %  Commonly known as:  LIDODERM     mupirocin ointment 2 %  Commonly known as:  [employer]  penicillin v potassium 500 MG tablet  Commonly known as:  VEETID     TURMERIC PO      TAKE these medications        ACIDOPHILUS PO  Take 1 capsule by mouth daily.     albuterol 108 (90 Base) MCG/ACT inhaler  Commonly known as:  PROAIR HFA  Inhale 2 puffs into the lungs every 6 (six) hours as needed for wheezing or shortness of breath.     benzonatate 200 MG capsule  Commonly known as:  TESSALON  Take 200 mg by mouth 3 (three) times daily as needed for cough.     budesonide-formoterol 160-4.5 MCG/ACT inhaler  Commonly known as:  SYMBICORT  Inhale 2 puffs into the lungs 2 (two) times daily.     cloNIDine 0.1 MG tablet  Commonly known as:  CATAPRES  Take 0.1 mg by mouth every morning.     COMBIVENT RESPIMAT 20-100 MCG/ACT Aers respimat  Generic  drug:  Ipratropium-Albuterol  Inhale 1 puff into the lungs every 6 (six) hours as needed for wheezing.     cyclobenzaprine 10 MG tablet  Commonly known as:  FLEXERIL  Take 10 mg by mouth at bedtime as needed for muscle spasms.     docusate sodium 100 MG capsule  Commonly known as:  COLACE  Take 1 capsule (100 mg total) by mouth 2 (two) times daily as needed for mild constipation.     doxycycline 100 MG capsule  Commonly known as:  VIBRAMYCIN  Take 100 mg by mouth 2 (two) times daily. For 10 days started 11-27-15     EQL COCONUT OIL 1000 MG Caps  Generic drug:  Coconut Oil  Take 2,000 mg by mouth daily.     ergocalciferol 50000 units capsule  Commonly known as:  VITAMIN D2  Take 50,000 Units by mouth 2 (two) times a week.     esomeprazole 40 MG capsule  Commonly known as:  NEXIUM  Take 1 capsule (40 mg total) by mouth daily before breakfast.     furosemide 40 MG tablet  Commonly known as:  LASIX  Take 1 tablet (40 mg total) by mouth daily.     glucosamine-chondroitin 500-400 MG tablet  Take 1 tablet by mouth daily.     guaiFENesin 600 MG 12 hr tablet  Commonly known as:  MUCINEX  Take by mouth as needed.     methocarbamol 500 MG tablet  Commonly known as:  ROBAXIN  Take 1 tablet (500 mg total) by mouth every 8 (eight) hours as needed for muscle spasms.     montelukast 10 MG tablet  Commonly known as:  SINGULAIR  Take 1 tablet (10 mg total) by mouth at bedtime.     oxyCODONE-acetaminophen 5-325 MG tablet  Commonly known as:  PERCOCET  Take 1-2 tablets by mouth every 4 (four) hours as needed for severe pain.     predniSONE 20 MG tablet  Commonly known as:  DELTASONE  Take 1 tablet (20 mg total) by mouth 2 (two) times daily. Take with food.     TART CHERRY ADVANCED PO  Take 1 tablet by mouth daily.     traMADol 50 MG tablet  Commonly known as:  ULTRAM  Take 1 tablet (50 mg total) by mouth every 6 (six) hours as needed for moderate pain.     valsartan 80 MG  tablet  Commonly known as:  DIOVAN  Take 1 tablet (80 mg total) by mouth daily.  Diagnostic Studies: Dg Chest 2 View  11/28/2015  CLINICAL DATA:  Preop rotator cuff repair.  Cough since 11/26/2015. EXAM: CHEST  2 VIEW COMPARISON:  04/29/2013 FINDINGS: Heart and mediastinal contours are within normal limits. No focal opacities or effusions. No acute bony abnormality. Degenerative changes in the mid to lower thoracic spine. IMPRESSION: No active cardiopulmonary disease. Electronically Signed   By: Charlett NoseKevin  Dover M.D.   On: 11/28/2015 11:10    Disposition: 01-Home or Self Care        Follow-up Information    Follow up with BEANE,JEFFREY C, MD In 2 weeks.   Specialty:  Orthopedic Surgery   Contact information:   [address] [place] [address] [phone]        Signed: Dorothy SparkBISSELL, JACLYN M. 12/04/2015, 8:33 AM

## 2015-12-20 ENCOUNTER — Encounter: Payer: Self-pay | Admitting: Physical Therapy

## 2015-12-20 ENCOUNTER — Ambulatory Visit (INDEPENDENT_AMBULATORY_CARE_PROVIDER_SITE_OTHER): Payer: BLUE CROSS/BLUE SHIELD | Admitting: Physical Therapy

## 2015-12-20 DIAGNOSIS — M6281 Muscle weakness (generalized): Secondary | ICD-10-CM | POA: Diagnosis not present

## 2015-12-20 DIAGNOSIS — M25611 Stiffness of right shoulder, not elsewhere classified: Secondary | ICD-10-CM | POA: Diagnosis not present

## 2015-12-20 DIAGNOSIS — R293 Abnormal posture: Secondary | ICD-10-CM | POA: Diagnosis not present

## 2015-12-20 DIAGNOSIS — M25511 Pain in right shoulder: Secondary | ICD-10-CM | POA: Diagnosis not present

## 2015-12-20 NOTE — Patient Instructions (Signed)
ROM: Pendulum (Circular)   [place] [phone]    Let right arm move in circle clockwise, then counterclockwise, by rocking body weight in circular pattern. Circle __10__ times each direction per set. Do __1__ sets per session. Do __4-5__ sessions per day.  ROM: External / Internal Rotation - Wand    Holding wand with right hand palm up, push out from body with other hand, palm down. Keep both elbows bent. When stretch is felt, hold __2-3__ seconds.  Repeat _10___ times per set. Do __1__ sets per session. Do __4-5__ sessions per day.   ROM: Flexion    Keeping left arm on table, slide body away until stretch is felt. Hold _2-3___ seconds. Repeat __10__ times per set. Do __1_ sets per session. Do _4-5__ sessions per day.  AROM: Elbow Flexion / Extension    With left hand palm up, gently bend elbow as far as possible. Then straighten arm as far as possible. Repeat _10___ times per set. Do _1___ sets per session. Do _4-5___ sessions per day.  AROM: Wrist Flexion / Extension    Actively bend right wrist forward then back as far as possible. Repeat __10__ times per set. Do _1___ sets per session. Do _4-5___ sessions per day.  Towel Roll Squeeze    With right forearm resting on surface, gently squeeze towel. Repeat _10_ times per set. Do __1__ sets per session. Do __4-5__ sessions per day.  Internal Rotation (Isometric) - Don't let arm move    Sit up nice and tall. With elbow bent and held at side, use other hand to apply gentle resistance to inward motion of arm. Hold __5__ seconds. Repeat __5-10__ times. Do _2-3___ sessions per day.   External Rotation (Isometric) - Don't let arm move    Sit up nice and tall. With elbow bent and held at side, use other hand to apply gentle resistance to outward motion of arm. Hold __5__ seconds. Repeat _5-10___ times. Do _2-3___ sessions per day.  Copyright  VHI. All rights reserved.

## 2015-12-20 NOTE — Therapy (Signed)
[facility] [place] [address] [place], [place], [address] Phone: [phone]   Fax:  [phone]  Physical Therapy Evaluation  Patient Details  Name: Megan Petersen MRN: [id number] Date of Birth: 1961-01-08 Referring Provider: Dr Jene Every  Encounter Date: 12/20/2015      PT End of Session - 12/20/15 1542    Visit Number 1   Number of Visits 24   Date for PT Re-Evaluation 03/13/16   PT Start Time 1447   PT Stop Time 1542   PT Time Calculation (min) 55 min   Activity Tolerance Patient tolerated treatment well      Past Medical History  Diagnosis Date  . Allergy   . Hypertension   . Hyperlipidemia   . Asthma   . GERD (gastroesophageal reflux disease)   . Morbid obesity (HCC)   . Osteoarthritis   . Rosacea   . Trigeminal neuralgia   . LSIL (low grade squamous intraepithelial lesion) on Pap smear   . Atypical glandular cells on Pap smear   . Primary cough headache   . Osteoarthritis   . Esophageal reflux   . Impaired fasting glucose   . Swelling of limb   . Headache   . Pneumonia 2016  . URI (upper respiratory infection)     cough with green sputum since 11-26-15  . Family history of adverse reaction to anesthesia     brother slow to awaken    Past Surgical History  Procedure Laterality Date  . Cholecystectomy    . Leep    . Right knee meniscus repair  2014  . Shoulder open rotator cuff repair Right 12/01/2015    Procedure: RIGHT SHOULDER MINI OPEN ROTATOR CUFF REPAIR WITH RESECTON OF DISTAL CLAVICLE AND SUBACROMIAL DECOMPRESSION;  Surgeon: Jene Every, MD;  Location: [facility];  Service: Orthopedics;  Laterality: Right;    There were no vitals filed for this visit.       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  Had MRI after follow up  repair. Elective surgery 12/01/15. Since surgery has been wearing her sling, having difficulty sleeping, performing pendulums. Ice at night PRN   Limitations Other (comment)  sleeping, assist with donning bra and some shirts   Diagnostic tests MRI showed torn RTC  3 of the four tendons   Patient Stated Goals sleep again, hold her grand daughters, ( 22 month old)    Currently in Pain? No/denies  when it's in the sling and she doesn't move it.  Has pain up to 7/10 if she tries to use her arm.             [facility] PT Assessment - 12/20/15 0001    Assessment   Medical Diagnosis Rt RTC repair   Referring Provider Dr Jene Every   Onset Date/Surgical Date 12/01/15   Hand Dominance Right   Next MD Visit 01/16/16   Prior Therapy not for the shoulder   Precautions   Precautions Shoulder  protocol for RTC   Type of Shoulder Precautions RTC    Balance Screen   Has the patient fallen in the past 6 months No   Has the patient had a decrease in activity level because of a fear of falling?  No   Is the patient reluctant to leave their  home because of a fear of falling?  No   Home [employer] information centre manager residence   Living Arrangements Spouse/significant other;Children   Prior Function   Level of Independence Other (comment)  assist with some ADLs   Vocation Full time employment  currently out of work [employer]   Vocation Requirements lift up to [employer]    Leisure quilting   Observation/Other Assessments   Focus on Therapeutic Outcomes (FOTO)  71% limited   Posture/Postural Control   Posture/Postural Control Postural limitations   Postural Limitations Rounded Shoulders;Forward head   ROM / Strength   AROM / PROM / Strength AROM;PROM;Strength   AROM   Overall AROM Comments elbow/wrist/hand WNL   AROM Assessment Site Cervical   Cervical Flexion WNL   Cervical Extension WNL   Cervical - Right Side Bend WNL   Cervical - Left Side Bend WNL   Cervical - Right Rotation WNL    Cervical - Left Rotation WNL with some discomfort    PROM   PROM Assessment Site Shoulder   Right/Left Shoulder Right  Lt WNL   Right Shoulder Flexion 90 Degrees  stopped here per protocol    Right Shoulder ABduction 50 Degrees   Right Shoulder Internal Rotation 68 Degrees   Right Shoulder External Rotation 22 Degrees   Strength   Overall Strength Comments NA due to recent surgery.    Palpation   Palpation comment hypomobile in incision with tenderness                   [facility] Adult PT Treatment/Exercise - 12/20/15 0001    Exercises   Exercises Shoulder   Shoulder Exercises: Supine   Protraction PROM   External Rotation Right;PROM  with dowel   Shoulder Exercises: Seated   Flexion PROM;Right;10 reps  arm on table   Other Seated Exercises elbow & wrist flex/ext, grip   Shoulder Exercises: Isometric Strengthening   External Rotation 5X5"  gentle/light pressure   Internal Rotation 5X5"  gentle/light pressure   Modalities   Modalities Vasopneumatic   Vasopneumatic   Number Minutes Vasopneumatic  15 minutes   Vasopnuematic Location  Shoulder  Rt   Vasopneumatic Pressure Medium   Vasopneumatic Temperature  3*                PT Education - 12/20/15 1520    Education provided Yes   Education Details HEP    Person(s) Educated Patient   Methods Explanation;Demonstration;Handout   Comprehension Returned demonstration;Verbalized understanding          PT Short Term Goals - 12/20/15 1547    PT SHORT TERM GOAL #1   Title Independent with initial HEP ( 01/17/16)    Time 4   Period Weeks   Status New   PT SHORT TERM GOAL #2   Title demo Rt shoulder PROM to [facility] ( 01/31/16)    Time 6   Period Weeks   Status New   PT SHORT TERM GOAL #3   Title tolerate inital Rt shoulder strengthening exercise ( 01/31/16)    Time 6   Period Weeks   Status New   PT SHORT TERM GOAL #4   Title improve FOTO =/< 50% limited ( 01/31/16)    Time 6   Period Weeks   Status  New           PT Long Term Goals - 12/20/15 1550    PT LONG TERM GOAL #1   Title Independent  with advanced HEP ( 03/13/16)   Time 12   Period Weeks   Status New   PT LONG TERM GOAL #2   Title demo Rt shoulder AROM WFL without pain to allow her to perform some quilting. ( 03/13/16)    Time 12   Period Weeks   Status New   PT LONG TERM GOAL #3   Title demo Rt shoulder strength =/> 5-/5 to allow return to work ( 03/13/16)    Time 12   Period Weeks   Status New   PT LONG TERM GOAL #4   Title report pain no greater than 2/10 with lifitng tasks ( 03/13/16)    Time 12   Period Weeks   Status New   PT LONG TERM GOAL #5   Title improve FOTO =/< 39% limited ( 03/13/16)    Time 12   Period Weeks   Status New               Plan - 12/20/15 1545    Clinical Impression Statement 54yo female presents 3 wks s/p Rt RTC repair.  She has been wearing her sling, icing and performing pendulums.  She presents for post surgical rehab following a protocol.    Rehab Potential Good   PT Frequency 2x / week   PT Duration 12 weeks   PT Treatment/Interventions Ultrasound;Patient/family education;Passive range of motion;Cryotherapy;Dry needling;Electrical Stimulation;Moist Heat;Therapeutic exercise;Manual techniques;Vasopneumatic Device;Taping   PT Next Visit Plan progress per protocol   Consulted and Agree with Plan of Care Patient      Patient will benefit from skilled therapeutic intervention in order to improve the following deficits and impairments:  Postural dysfunction, Decreased strength, Increased edema, Decreased scar mobility, Pain, Impaired UE functional use, Decreased range of motion  Visit Diagnosis: Pain in right shoulder - Plan: PT plan of care cert/re-cert  Muscle weakness (generalized) - Plan: PT plan of care cert/re-cert  Abnormal posture - Plan: PT plan of care cert/re-cert  Stiffness of right shoulder, not elsewhere classified - Plan: PT plan of care  cert/re-cert     Problem List Patient Active Problem List   Diagnosis Date Noted  . Complete rotator cuff tear 12/01/2015  . Wheezing 04/07/2014  . Impaired fasting glucose 07/26/2013  . Essential hypertension, benign 07/26/2013  . Back pain 07/26/2013  . Knee pain, bilateral 01/10/2013  . Morbid obesity (HCC) 01/10/2013  . Headache(784.0) 12/06/2012    Roderic ScarceSusan Lovie Zarling PT 12/20/2015, 3:57 PM  [facility] [address] [place], [place], [address] Phone: [phone]   Fax:  [phone]  Name: Megan Petersen MRN: [id number] Date of Birth: March 05, 1961

## 2015-12-22 ENCOUNTER — Ambulatory Visit (INDEPENDENT_AMBULATORY_CARE_PROVIDER_SITE_OTHER): Payer: BLUE CROSS/BLUE SHIELD | Admitting: Rehabilitative and Restorative Service Providers"

## 2015-12-22 ENCOUNTER — Encounter: Payer: Self-pay | Admitting: Rehabilitative and Restorative Service Providers"

## 2015-12-22 DIAGNOSIS — M6281 Muscle weakness (generalized): Secondary | ICD-10-CM

## 2015-12-22 DIAGNOSIS — M25611 Stiffness of right shoulder, not elsewhere classified: Secondary | ICD-10-CM | POA: Diagnosis not present

## 2015-12-22 DIAGNOSIS — R293 Abnormal posture: Secondary | ICD-10-CM | POA: Diagnosis not present

## 2015-12-22 DIAGNOSIS — M25511 Pain in right shoulder: Secondary | ICD-10-CM | POA: Diagnosis not present

## 2015-12-22 NOTE — Therapy (Signed)
[facility] [address] [place], [place], [address] Phone: [phone]   Fax:  [phone]  Physical Therapy Treatment  Patient Details  Name: Megan Petersen MRN: [id number] Date of Birth: [birth year] Referring Provider: Dr. Shelle IronBeane  Encounter Date: 12/22/2015      PT End of Session - 12/22/15 0804    Visit Number 2   Number of Visits 24   Date for PT Re-Evaluation 03/13/16   PT Start Time 0804   PT Stop Time 0859   PT Time Calculation (min) 55 min   Activity Tolerance Patient tolerated treatment well      Past Medical History  Diagnosis Date  . Allergy   . Hypertension   . Hyperlipidemia   . Asthma   . GERD (gastroesophageal reflux disease)   . Morbid obesity (HCC)   . Osteoarthritis   . Rosacea   . Trigeminal neuralgia   . LSIL (low grade squamous intraepithelial lesion) on Pap smear   . Atypical glandular cells on Pap smear   . Primary cough headache   . Osteoarthritis   . Esophageal reflux   . Impaired fasting glucose   . Swelling of limb   . Headache   . Pneumonia 2016  . URI (upper respiratory infection)     cough with green sputum since 11-26-15  . Family history of adverse reaction to anesthesia     brother slow to awaken    Past Surgical History  Procedure Laterality Date  . Cholecystectomy    . Leep    . Right knee meniscus repair  2014  . Shoulder open rotator cuff repair Right 12/01/2015    Procedure: RIGHT SHOULDER MINI OPEN ROTATOR CUFF REPAIR WITH RESECTON OF DISTAL CLAVICLE AND SUBACROMIAL DECOMPRESSION;  Surgeon: Jene EveryJeffrey Beane, MD;  Location: [facility];  Service: Orthopedics;  Laterality: Right;    There were no vitals filed for this visit.      Subjective Assessment - 12/22/15 0804    Subjective Exercises are going OK - some difficulty with the "one with the pole"(ER with cane). Not a lot of pain this am - stiffness    Currently in Pain? Yes   Pain Score 1    Pain Location Shoulder    Pain Orientation Right   Pain Descriptors / Indicators Tightness   Pain Type Acute pain;Surgical pain   Pain Onset 1 to 4 weeks ago   Pain Frequency Intermittent            [facility] PT Assessment - 12/22/15 0001    Assessment   Medical Diagnosis Rt RTC repair   Referring Provider Dr. Shelle IronBeane   Onset Date/Surgical Date 12/01/15   Hand Dominance Right   Next MD Visit 01/16/16   PROM   PROM Assessment Site Shoulder  supine   Right Shoulder Flexion 90 Degrees  easily    Right Shoulder ABduction 80 Degrees  in scapular plane   Right Shoulder Internal Rotation 70 Degrees   Right Shoulder External Rotation 34 Degrees                     [facility] Adult PT Treatment/Exercise - 12/22/15 0001    Therapeutic Activites    Therapeutic Activities --  myofacial ball release work standing    Shoulder Exercises: Supine   External Rotation Right;PROM  with dowel   Shoulder Exercises: Seated   Flexion PROM;Right;10 reps  arm on table 10 sec hold    Other Seated Exercises elbow &  wrist flex/ext, grip  encouraged pt to rest arm at side with elbow extended    Shoulder Exercises: Standing   Other Standing Exercises scap squeeze 10 sec x 10 with noodle    Other Standing Exercises pendulum 30 CW/CCW    Shoulder Exercises: Isometric Strengthening   External Rotation 5X5"  gentle/light pressure   Internal Rotation 5X5"  gentle/light pressure   Shoulder Exercises: Stretch   Table Stretch - Flexion --  10 reps x 10 sec hold    Other Shoulder Stretches standing w/ noodle ER with cane 10 sec hole x 10    Modalities   Modalities Vasopneumatic;Electrical Stimulation   Electrical Stimulation   Electrical Stimulation Location Rt shoulder    Electrical Stimulation Action IFC   Electrical Stimulation Parameters to tolerance    Electrical Stimulation Goals Pain   Vasopneumatic   Number Minutes Vasopneumatic  15 minutes   Vasopnuematic Location  Shoulder  Rt   Vasopneumatic Pressure  Medium   Vasopneumatic Temperature  3*   Manual Therapy   Soft tissue mobilization Rt shoudler girdle    Passive ROM Rt shoulder                 PT Education - 12/22/15 0836    Education provided Yes   Education Details HEP   Person(s) Educated Patient   Methods Explanation;Demonstration;Tactile cues;Verbal cues;Handout   Comprehension Verbalized understanding;Returned demonstration;Verbal cues required;Tactile cues required          PT Short Term Goals - 12/20/15 1547    PT SHORT TERM GOAL #1   Title Independent with initial HEP ( 01/17/16)    Time 4   Period Weeks   Status New   PT SHORT TERM GOAL #2   Title demo Rt shoulder PROM to [facility] ( 01/31/16)    Time 6   Period Weeks   Status New   PT SHORT TERM GOAL #3   Title tolerate inital Rt shoulder strengthening exercise ( 01/31/16)    Time 6   Period Weeks   Status New   PT SHORT TERM GOAL #4   Title improve FOTO =/< 50% limited ( 01/31/16)    Time 6   Period Weeks   Status New           PT Long Term Goals - 12/20/15 1550    PT LONG TERM GOAL #1   Title Independent with advanced HEP ( 03/13/16)   Time 12   Period Weeks   Status New   PT LONG TERM GOAL #2   Title demo Rt shoulder AROM WFL without pain to allow her to perform some quilting. ( 03/13/16)    Time 12   Period Weeks   Status New   PT LONG TERM GOAL #3   Title demo Rt shoulder strength =/> 5-/5 to allow return to work ( 03/13/16)    Time 12   Period Weeks   Status New   PT LONG TERM GOAL #4   Title report pain no greater than 2/10 with lifitng tasks ( 03/13/16)    Time 12   Period Weeks   Status New   PT LONG TERM GOAL #5   Title improve FOTO =/< 39% limited ( 03/13/16)    Time 12   Period Weeks   Status New               Plan - 12/22/15 1610    Clinical Impression Statement Doing well with exercises with minor correction for  Rt ER - added stretch for ER in standing and work on posture with noodle. Responded well to myofacial  release with ball. Added estim and pt will add TENS for home for stiffness and discomfort.    Rehab Potential Good   PT Frequency 2x / week   PT Duration 12 weeks   PT Treatment/Interventions Ultrasound;Patient/family education;Passive range of motion;Cryotherapy;Dry needling;Electrical Stimulation;Moist Heat;Therapeutic exercise;Manual techniques;Vasopneumatic Device;Taping   PT Next Visit Plan progress per protocol   PT Home Exercise Plan HEP   Consulted and Agree with Plan of Care Patient      Patient will benefit from skilled therapeutic intervention in order to improve the following deficits and impairments:  Postural dysfunction, Decreased strength, Increased edema, Decreased scar mobility, Pain, Impaired UE functional use, Decreased range of motion  Visit Diagnosis: Pain in right shoulder  Muscle weakness (generalized)  Abnormal posture  Stiffness of right shoulder, not elsewhere classified     Problem List Patient Active Problem List   Diagnosis Date Noted  . Complete rotator cuff tear 12/01/2015  . Wheezing 04/07/2014  . Impaired fasting glucose 07/26/2013  . Essential hypertension, benign 07/26/2013  . Back pain 07/26/2013  . Knee pain, bilateral 01/10/2013  . Morbid obesity (HCC) 01/10/2013  . Headache(784.0) 12/06/2012    Megan Petersen PT, MPH  12/22/2015, 9:30 AM  [facility] [address] [place], [place], [address] Phone: [phone]   Fax:  [phone]  Name: Megan Petersen MRN: [id number] Date of Birth: 09-29-1960

## 2015-12-22 NOTE — Patient Instructions (Signed)
Self massage using a 4 inch rubber ball   Shoulder Blade Squeeze   Can use a swim noodle along spine to remind you to stand straight and tall  Rotate shoulders back, then squeeze shoulder blades down and back . Hold 10 sec Repeat ___10_ times. Do __several__ sessions per day.

## 2015-12-25 ENCOUNTER — Ambulatory Visit (INDEPENDENT_AMBULATORY_CARE_PROVIDER_SITE_OTHER): Payer: BLUE CROSS/BLUE SHIELD | Admitting: Physical Therapy

## 2015-12-25 ENCOUNTER — Encounter: Payer: Self-pay | Admitting: Physical Therapy

## 2015-12-25 DIAGNOSIS — M6281 Muscle weakness (generalized): Secondary | ICD-10-CM | POA: Diagnosis not present

## 2015-12-25 DIAGNOSIS — M25611 Stiffness of right shoulder, not elsewhere classified: Secondary | ICD-10-CM

## 2015-12-25 DIAGNOSIS — R293 Abnormal posture: Secondary | ICD-10-CM

## 2015-12-25 DIAGNOSIS — M25511 Pain in right shoulder: Secondary | ICD-10-CM | POA: Diagnosis not present

## 2015-12-25 NOTE — Therapy (Signed)
[place], [place], [address] Phone: [phone]   Fax:  [phone]  Physical Therapy Treatment  Patient Details  Name: Megan Petersen MRN: [id number] Date of Birth: Feb 22, 1961 Referring Provider: Dr. Tonita Cong  Encounter Date: 12/25/2015      PT End of Session - 12/25/15 1516    Visit Number 3   Number of Visits 24   Date for PT Re-Evaluation 03/13/16   PT Start Time 1432   PT Stop Time 1529   PT Time Calculation (min) 57 min   Activity Tolerance Patient tolerated treatment well      Past Medical History  Diagnosis Date  . Allergy   . Hypertension   . Hyperlipidemia   . Asthma   . GERD (gastroesophageal reflux disease)   . Morbid obesity ([place])   . Osteoarthritis   . Rosacea   . Trigeminal neuralgia   . LSIL (low grade squamous intraepithelial lesion) on Pap smear   . Atypical glandular cells on Pap smear   . Primary cough headache   . Osteoarthritis   . Esophageal reflux   . Impaired fasting glucose   . Swelling of limb   . Headache   . Pneumonia 2016  . URI (upper respiratory infection)     cough with green sputum since 11-26-15  . Family history of adverse reaction to anesthesia     brother slow to awaken    Past Surgical History  Procedure Laterality Date  . Cholecystectomy    . Leep    . Right knee meniscus repair  2014  . Shoulder open rotator cuff repair Right 12/01/2015    Procedure: RIGHT SHOULDER MINI OPEN ROTATOR CUFF REPAIR WITH RESECTON OF DISTAL CLAVICLE AND SUBACROMIAL DECOMPRESSION;  Surgeon: Susa Day, MD;  Location: [facility];  Service: Orthopedics;  Laterality: Right;    There were no vitals filed for this visit.      Subjective Assessment - 12/25/15 1434    Subjective Pt states she is a little soreness in her Rt shoulder, she fell on saturday, landed on a box on her Lt side, states she didn't hurt her Rt shoulder.    Currently in Pain? Yes   Pain Score 4    Pain Location Shoulder   Pain Orientation Right;Distal   Pain Descriptors / Indicators Aching;Tightness   Pain Type Surgical pain   Pain Onset 1 to 4 weeks ago   Pain Frequency Intermittent   Aggravating Factors  moving the arm, sleeping.    Pain Relieving Factors ice                         [facility] Adult PT Treatment/Exercise - 12/25/15 0001    Exercises   Exercises Shoulder   Shoulder Exercises: Seated   Other Seated Exercises bicep/wrist flex/ext/supination/pronation with 1#   Shoulder Exercises: Prone   Retraction Strengthening;Right   Shoulder Exercises: Standing   Retraction Strengthening;Both;10 reps  squeezing noodle   Other Standing Exercises doorway stretch 2x30 sec   Shoulder Exercises: Isometric Strengthening   Extension --  10 x 5sec light   Modalities   Modalities Electrical Stimulation;Ultrasound;Vasopneumatic   Electrical Stimulation   Electrical Stimulation Location Rt shoulder and deltoid   Electrical Stimulation Action IFC   Electrical Stimulation Parameters to tolerance   Electrical Stimulation Goals Pain   Ultrasound   Ultrasound Location Rt deltoid   Ultrasound Parameters 100%, 1.39mz, 1.5w/cm2   Ultrasound Goals Pain  tone   Vasopneumatic   Number Minutes Vasopneumatic  15 minutes   Vasopnuematic Location  Shoulder   Vasopneumatic Pressure Low   Vasopneumatic Temperature  3*   Manual Therapy   Soft tissue mobilization Rt deltoid                  PT Short Term Goals - 12/25/15 1446    PT SHORT TERM GOAL #1   Title Independent with initial HEP ( 01/17/16)    Status Achieved   PT SHORT TERM GOAL #2   Title demo Rt shoulder PROM to [facility] ( 01/31/16)    Status On-going   PT SHORT TERM GOAL #3   Title tolerate inital Rt shoulder strengthening exercise ( 01/31/16)    Status On-going   PT SHORT TERM GOAL #4   Title improve FOTO =/< 50% limited ( 01/31/16)    Status On-going           PT Long Term Goals - 12/25/15 1447     PT LONG TERM GOAL #1   Title Independent with advanced HEP ( 03/13/16)   Status On-going   PT LONG TERM GOAL #2   Title demo Rt shoulder AROM WFL without pain to allow her to perform some quilting. ( 03/13/16)    Status On-going   PT LONG TERM GOAL #3   Title demo Rt shoulder strength =/> 5-/5 to allow return to work ( 03/13/16)    Status On-going   PT LONG TERM GOAL #4   Title report pain no greater than 2/10 with lifitng tasks ( 03/13/16)    Status On-going   PT LONG TERM GOAL #5   Title improve FOTO =/< 39% limited ( 03/13/16)    Status On-going               Plan - 12/25/15 1518    Clinical Impression Statement Megan Petersen is doing very well with her PROM, she is beginning to have some Rt deltoid spasms and has been instructed to start massaging it, using TENs and heat on the deltoid.  She has met some STGs and making progress to the others.    Rehab Potential Good   PT Frequency 2x / week   PT Duration 12 weeks   PT Treatment/Interventions Ultrasound;Patient/family education;Passive range of motion;Cryotherapy;Dry needling;Electrical Stimulation;Moist Heat;Therapeutic exercise;Manual techniques;Vasopneumatic Device;Taping   PT Next Visit Plan progress per protocol      Patient will benefit from skilled therapeutic intervention in order to improve the following deficits and impairments:  Postural dysfunction, Decreased strength, Increased edema, Decreased scar mobility, Pain, Impaired UE functional use, Decreased range of motion  Visit Diagnosis: Pain in right shoulder  Muscle weakness (generalized)  Abnormal posture  Stiffness of right shoulder, not elsewhere classified     Problem List Patient Active Problem List   Diagnosis Date Noted  . Complete rotator cuff tear 12/01/2015  . Wheezing 04/07/2014  . Impaired fasting glucose 07/26/2013  . Essential hypertension, benign 07/26/2013  . Back pain 07/26/2013  . Knee pain, bilateral 01/10/2013  . Morbid obesity ([place])  01/10/2013  . Headache(784.0) 12/06/2012    Jeral Pinch PT  12/25/2015, 3:19 PM  [facility] [place], [place], [address] Phone: [phone]   Fax:  [phone]  Name: Megan Petersen MRN: [id number] Date of Birth: [birth year]

## 2015-12-28 ENCOUNTER — Encounter: Payer: Self-pay | Admitting: Physical Therapy

## 2015-12-28 ENCOUNTER — Ambulatory Visit (INDEPENDENT_AMBULATORY_CARE_PROVIDER_SITE_OTHER): Payer: BLUE CROSS/BLUE SHIELD | Admitting: Physical Therapy

## 2015-12-28 DIAGNOSIS — M25611 Stiffness of right shoulder, not elsewhere classified: Secondary | ICD-10-CM | POA: Diagnosis not present

## 2015-12-28 DIAGNOSIS — M25511 Pain in right shoulder: Secondary | ICD-10-CM

## 2015-12-28 DIAGNOSIS — M6281 Muscle weakness (generalized): Secondary | ICD-10-CM

## 2015-12-28 DIAGNOSIS — R293 Abnormal posture: Secondary | ICD-10-CM

## 2015-12-28 NOTE — Therapy (Signed)
[facility] [address] [place], [place], [address] Phone: [phone]   Fax:  [phone]  Physical Therapy Treatment  Patient Details  Name: Megan Petersen MRN: [id number] Date of Birth: 01-Sep-1960 Referring Provider: Dr. Shelle IronBeane  Encounter Date: 12/28/2015      PT End of Session - 12/28/15 1455    Visit Number 4   Number of Visits 24   Date for PT Re-Evaluation 03/13/16   PT Start Time 1453   PT Stop Time 1534   PT Time Calculation (min) 41 min   Activity Tolerance Patient tolerated treatment well      Past Medical History  Diagnosis Date  . Allergy   . Hypertension   . Hyperlipidemia   . Asthma   . GERD (gastroesophageal reflux disease)   . Morbid obesity (HCC)   . Osteoarthritis   . Rosacea   . Trigeminal neuralgia   . LSIL (low grade squamous intraepithelial lesion) on Pap smear   . Atypical glandular cells on Pap smear   . Primary cough headache   . Osteoarthritis   . Esophageal reflux   . Impaired fasting glucose   . Swelling of limb   . Headache   . Pneumonia 2016  . URI (upper respiratory infection)     cough with green sputum since 11-26-15  . Family history of adverse reaction to anesthesia     brother slow to awaken    Past Surgical History  Procedure Laterality Date  . Cholecystectomy    . Leep    . Right knee meniscus repair  2014  . Shoulder open rotator cuff repair Right 12/01/2015    Procedure: RIGHT SHOULDER MINI OPEN ROTATOR CUFF REPAIR WITH RESECTON OF DISTAL CLAVICLE AND SUBACROMIAL DECOMPRESSION;  Surgeon: Jene EveryJeffrey Beane, MD;  Location: [facility];  Service: Orthopedics;  Laterality: Right;    There were no vitals filed for this visit.      Subjective Assessment - 12/28/15 1455    Subjective She is using her TENs unit on her Rt shoulder, her Rt hip is really sore after her near fall on Saturday.    Currently in Pain? No/denies                         [facility]  PT Treatment/Exercise - 12/28/15 0001    Shoulder Exercises: Seated   Retraction Strengthening;Both;20 reps  around pool noodle.    Shoulder Exercises: Standing   Other Standing Exercises shoulder shrugs 1#, leaning on noodle, bicep curls 1# same position, shoulder ext to wall   Shoulder Exercises: Isometric Strengthening   Flexion --  10x5sec   Extension --  10x5sec   ABduction --  10x5sec   Modalities   Modalities Ultrasound;   Ultrasound   Ultrasound Location Rt deltoid   Ultrasound Parameters 100%, 1.680mHz, 1.5w/cm2   Ultrasound Goals Pain  tone   Vasopneumatic   Number Minutes Vasopneumatic  --  held today   Manual Therapy   Passive ROM Rt shoulder, flex, abd and ER per protocol                  PT Short Term Goals - 12/25/15 1446    PT SHORT TERM GOAL #1   Title Independent with initial HEP ( 01/17/16)    Status Achieved   PT SHORT TERM GOAL #2   Title [facility] ( 01/31/16)    Status On-going   PT  SHORT TERM GOAL #3   Title tolerate inital Rt shoulder strengthening exercise ( 01/31/16)    Status On-going   PT SHORT TERM GOAL #4   Title improve FOTO =/< 50% limited ( 01/31/16)    Status On-going           PT Long Term Goals - 12/25/15 1447    PT LONG TERM GOAL #1   Title Independent with advanced HEP ( 03/13/16)   Status On-going   PT LONG TERM GOAL #2   Title demo Rt shoulder AROM WFL without pain to allow her to perform some quilting. ( 03/13/16)    Status On-going   PT LONG TERM GOAL #3   Title demo Rt shoulder strength =/> 5-/5 to allow return to work ( 03/13/16)    Status On-going   PT LONG TERM GOAL #4   Title report pain no greater than 2/10 with lifitng tasks ( 03/13/16)    Status On-going   PT LONG TERM GOAL #5   Title improve FOTO =/< 39% limited ( 03/13/16)    Status On-going               Plan - 12/28/15 1542    Clinical Impression Statement Megan Petersen is having less pain and tightness in her Rt deltoid   Rehab  Potential Good   PT Frequency 2x / week   PT Duration 12 weeks   PT Treatment/Interventions Ultrasound;Patient/family education;Passive range of motion;Cryotherapy;Dry needling;Electrical Stimulation;Moist Heat;Therapeutic exercise;Manual techniques;Vasopneumatic Device;Taping   PT Next Visit Plan progress per protocol able to move into next phase   Consulted and Agree with Plan of Care Patient      Patient will benefit from skilled therapeutic intervention in order to improve the following deficits and impairments:  Postural dysfunction, Decreased strength, Increased edema, Decreased scar mobility, Pain, Impaired UE functional use, Decreased range of motion  Visit Diagnosis: Pain in right shoulder  Muscle weakness (generalized)  Abnormal posture  Stiffness of right shoulder, not elsewhere classified     Problem List Patient Active Problem List   Diagnosis Date Noted  . Complete rotator cuff tear 12/01/2015  . Wheezing 04/07/2014  . Impaired fasting glucose 07/26/2013  . Essential hypertension, benign 07/26/2013  . Back pain 07/26/2013  . Knee pain, bilateral 01/10/2013  . Morbid obesity (HCC) 01/10/2013  . Headache(784.0) 12/06/2012    Roderic Scarce PT 12/28/2015, 4:50 PM  [facility] [address] [place], [place], [address] Phone: [phone]   Fax:  [phone]  Name: Megan Petersen MRN: [id number] Date of Birth: 11/03/1960

## 2016-01-01 ENCOUNTER — Ambulatory Visit (INDEPENDENT_AMBULATORY_CARE_PROVIDER_SITE_OTHER): Payer: BLUE CROSS/BLUE SHIELD | Admitting: Physical Therapy

## 2016-01-01 ENCOUNTER — Encounter: Payer: Self-pay | Admitting: Physical Therapy

## 2016-01-01 DIAGNOSIS — R293 Abnormal posture: Secondary | ICD-10-CM

## 2016-01-01 DIAGNOSIS — M25511 Pain in right shoulder: Secondary | ICD-10-CM | POA: Diagnosis not present

## 2016-01-01 DIAGNOSIS — M25611 Stiffness of right shoulder, not elsewhere classified: Secondary | ICD-10-CM

## 2016-01-01 DIAGNOSIS — M6281 Muscle weakness (generalized): Secondary | ICD-10-CM

## 2016-01-01 NOTE — Patient Instructions (Signed)
ROM: External Rotation (Alternate)   [place] [id number] [id number]    Keep palm of right hand against door frame and elbow bent at 90. Turn body from fixed hand until stretch is felt. Hold __3-5__ seconds. Repeat _10___ times per set. Do _1___ sets per session. Do __1-2__ sessions per day.   Scapular: Protraction - 90 of Flexion    Clasp hands together, lift arms up toward ceiling, keeping elbows straight and back against floor, slowly bring hands over head. Return to starting position of arms at sides.  Repeat _10___ times per set. Do _2-3___ sets per session. Do _1-2___ sessions per day. Left arm moves right.   Copyright  VHI. All rights reserved.

## 2016-01-01 NOTE — Therapy (Signed)
[facility] [address] [place], [place], [address] Phone: [phone]   Fax:  [phone]  Physical Therapy Treatment  Patient Details  Name: Megan Petersen MRN: [id number] Date of Birth: 26-Sep-1960 Referring Provider: Dr Shelle IronBeane  Encounter Date: 01/01/2016      PT End of Session - 01/01/16 1146    Visit Number 5   Number of Visits 24   Date for PT Re-Evaluation 03/13/16   PT Start Time 1147   PT Stop Time 1239   PT Time Calculation (min) 52 min   Activity Tolerance Patient tolerated treatment well      Past Medical History  Diagnosis Date  . Allergy   . Hypertension   . Hyperlipidemia   . Asthma   . GERD (gastroesophageal reflux disease)   . Morbid obesity (HCC)   . Osteoarthritis   . Rosacea   . Trigeminal neuralgia   . LSIL (low grade squamous intraepithelial lesion) on Pap smear   . Atypical glandular cells on Pap smear   . Primary cough headache   . Osteoarthritis   . Esophageal reflux   . Impaired fasting glucose   . Swelling of limb   . Headache   . Pneumonia 2016  . URI (upper respiratory infection)     cough with green sputum since 11-26-15  . Family history of adverse reaction to anesthesia     brother slow to awaken    Past Surgical History  Procedure Laterality Date  . Cholecystectomy    . Leep    . Right knee meniscus repair  2014  . Shoulder open rotator cuff repair Right 12/01/2015    Procedure: RIGHT SHOULDER MINI OPEN ROTATOR CUFF REPAIR WITH RESECTON OF DISTAL CLAVICLE AND SUBACROMIAL DECOMPRESSION;  Surgeon: Jene EveryJeffrey Beane, MD;  Location: [facility];  Service: Orthopedics;  Laterality: Right;    There were no vitals filed for this visit.      Subjective Assessment - 01/01/16 1147    Subjective Megan Petersen reports she feels stiff in her Rt shoulder today, she tried to sleep without her pillow support and it wasn't good. She is using the pillows again and it helps    Currently in Pain? Yes   Pain Score 3    Pain Location Shoulder   Pain Orientation Right   Pain Descriptors / Indicators Tightness   Pain Type Surgical pain   Pain Onset More than a month ago   Pain Frequency Intermittent   Aggravating Factors  sleeping and moving the arm wrong   Pain Relieving Factors ice            [facility] PT Assessment - 01/01/16 0001    Assessment   Medical Diagnosis Rt RTC repair   Referring Provider Dr Shelle IronBeane   Onset Date/Surgical Date 12/01/15   Hand Dominance Right   Next MD Visit 01/16/16   ROM / Strength   AROM / PROM / Strength PROM   PROM   PROM Assessment Site Shoulder   Right Shoulder Flexion 147 Degrees   Right Shoulder ABduction 110 Degrees   Right Shoulder Internal Rotation 90 Degrees   Right Shoulder External Rotation 42 Degrees  50 degrees after stretching                     [facility] Adult PT Treatment/Exercise - 01/01/16 0001    Shoulder Exercises: Supine   Other Supine Exercises 20 reps thoracic lifts and scap depression    Shoulder Exercises: Pulleys  Flexion 2 minutes   ABduction 2 minutes  scaption   Shoulder Exercises: Stretch   External Rotation Stretch --  10 reps, 3 sec holds, arm at side turning body   Modalities   Modalities Office manager Location Rt shoulder and deltoid   Electrical Stimulation Action IFC   Electrical Stimulation Parameters to tolerance   Electrical Stimulation Goals Pain   Vasopneumatic   Number Minutes Vasopneumatic  15 minutes   Vasopnuematic Location  Shoulder   Vasopneumatic Pressure Low   Vasopneumatic Temperature  3*   Manual Therapy   Manual Therapy Passive ROM;Soft tissue mobilization   Passive ROM Rt shoulder, flex, abd and ER per protocol                PT Education - 01/01/16 1215    Education provided Yes   Education Details shoulder ROM - ER/flex   Person(s) Educated Patient   Methods  Explanation;Demonstration;Handout   Comprehension Returned demonstration;Verbalized understanding          PT Short Term Goals - 01/01/16 1226    PT SHORT TERM GOAL #1   Title Independent with initial HEP ( 01/17/16)    Status New   PT SHORT TERM GOAL #2   Title demo Rt shoulder PROM to [employer] ( 01/31/16)    Status On-going   PT SHORT TERM GOAL #3   Title tolerate inital Rt shoulder strengthening exercise ( 01/31/16)    Status On-going   PT SHORT TERM GOAL #4   Title improve FOTO =/< 50% limited ( 01/31/16)    Status On-going           PT Long Term Goals - 01/01/16 1226    PT LONG TERM GOAL #1   Title Independent with advanced HEP ( 03/13/16)   Time 12   Period Weeks   Status On-going   PT LONG TERM GOAL #2   Title demo Rt shoulder AROM WFL without pain to allow her to perform some quilting. ( 03/13/16)    Time 12   Period Weeks   Status On-going   PT LONG TERM GOAL #3   Title demo Rt shoulder strength =/> 5-/5 to allow return to work ( 03/13/16)    Time 12   Period Weeks   Status On-going   PT LONG TERM GOAL #4   Title report pain no greater than 2/10 with lifitng tasks ( 03/13/16)    Time 12   Period Weeks   Status On-going   PT LONG TERM GOAL #5   Title improve FOTO =/< 39% limited ( 03/13/16)    Time 12   Period Weeks   Status On-going               Plan - 01/01/16 1227    Clinical Impression Statement Megan Petersen is now 5 weeks post op, she tolerated increased PROM to the Rt shoulder with minimal pain. She is on track with her protocol. Resumed stim and vaso as she had some post treatment soreness last visit and we performed more today.    Rehab Potential Good   PT Frequency 2x / week   PT Duration 12 weeks   PT Treatment/Interventions Ultrasound;Patient/family education;Passive range of motion;Cryotherapy;Dry needling;Electrical Stimulation;Moist Heat;Therapeutic exercise;Manual techniques;Vasopneumatic Device;Taping   PT Next Visit Plan progress per protocol  able to move into next phase      Patient will benefit from skilled therapeutic intervention in order to improve the following  deficits and impairments:  Postural dysfunction, Decreased strength, Increased edema, Decreased scar mobility, Pain, Impaired UE functional use, Decreased range of motion  Visit Diagnosis: Pain in right shoulder  Muscle weakness (generalized)  Abnormal posture  Stiffness of right shoulder, not elsewhere classified     Problem List Patient Active Problem List   Diagnosis Date Noted  . Complete rotator cuff tear 12/01/2015  . Wheezing 04/07/2014  . Impaired fasting glucose 07/26/2013  . Essential hypertension, benign 07/26/2013  . Back pain 07/26/2013  . Knee pain, bilateral 01/10/2013  . Morbid obesity (HCC) 01/10/2013  . Headache(784.0) 12/06/2012    Roderic Scarce PT 01/01/2016, 12:28 PM  [facility] [address] [place] [address] [place], [place], [address] Phone: [phone]   Fax:  [phone]  Name: Megan Petersen MRN: [id number] Date of Birth: 1960/11/22

## 2016-01-04 ENCOUNTER — Encounter: Payer: Self-pay | Admitting: Physical Therapy

## 2016-01-04 ENCOUNTER — Ambulatory Visit (INDEPENDENT_AMBULATORY_CARE_PROVIDER_SITE_OTHER): Payer: BLUE CROSS/BLUE SHIELD | Admitting: Physical Therapy

## 2016-01-04 DIAGNOSIS — M25611 Stiffness of right shoulder, not elsewhere classified: Secondary | ICD-10-CM

## 2016-01-04 DIAGNOSIS — M25511 Pain in right shoulder: Secondary | ICD-10-CM | POA: Diagnosis not present

## 2016-01-04 DIAGNOSIS — M6281 Muscle weakness (generalized): Secondary | ICD-10-CM

## 2016-01-04 DIAGNOSIS — R293 Abnormal posture: Secondary | ICD-10-CM

## 2016-01-04 NOTE — Therapy (Signed)
[facility] [place] [address] [place], [place], [address] Phone: [phone]   Fax:  [phone]  Physical Therapy Treatment  Patient Details  Name: Megan Petersen MRN: [id number] Date of Birth: Jan 11, 1961 Referring Provider: Dr Shelle Iron  Encounter Date: 01/04/2016      PT End of Session - 01/04/16 1155    Visit Number 6   Number of Visits 24   Date for PT Re-Evaluation 03/13/16   PT Start Time 1150   PT Stop Time 1243   PT Time Calculation (min) 53 min   Activity Tolerance Patient tolerated treatment well      Past Medical History  Diagnosis Date  . Allergy   . Hypertension   . Hyperlipidemia   . Asthma   . GERD (gastroesophageal reflux disease)   . Morbid obesity (HCC)   . Osteoarthritis   . Rosacea   . Trigeminal neuralgia   . LSIL (low grade squamous intraepithelial lesion) on Pap smear   . Atypical glandular cells on Pap smear   . Primary cough headache   . Osteoarthritis   . Esophageal reflux   . Impaired fasting glucose   . Swelling of limb   . Headache   . Pneumonia 2016  . URI (upper respiratory infection)     cough with green sputum since 11-26-15  . Family history of adverse reaction to anesthesia     brother slow to awaken    Past Surgical History  Procedure Laterality Date  . Cholecystectomy    . Leep    . Right knee meniscus repair  2014  . Shoulder open rotator cuff repair Right 12/01/2015    Procedure: RIGHT SHOULDER MINI OPEN ROTATOR CUFF REPAIR WITH RESECTON OF DISTAL CLAVICLE AND SUBACROMIAL DECOMPRESSION;  Surgeon: Jene Every, MD;  Location: [facility];  Service: Orthopedics;  Laterality: Right;    There were no vitals filed for this visit.      Subjective Assessment - 01/04/16 1155    Subjective Pt was a little sore and stiff after the last visit.    Currently in Pain? No/denies  only stiffness                         [facility] Adult PT Treatment/Exercise - 01/04/16  0001    Shoulder Exercises: Supine   Other Supine Exercises AAROM with dowel, flex, press up,    Shoulder Exercises: Prone   Retraction Strengthening;Right  arm off EOB    Shoulder Exercises: Pulleys   Flexion 2 minutes   ABduction 2 minutes  scaption   Shoulder Exercises: Stretch   Other Shoulder Stretches standing AA extension & abd with dowel    Modalities   Modalities Electrical Stimulation;Vasopneumatic   Electrical Stimulation   Electrical Stimulation Location Rt shoulder and deltoid   Electrical Stimulation Action IFC    Electrical Stimulation Parameters to tolerance   Electrical Stimulation Goals Edema;Pain   Vasopneumatic   Number Minutes Vasopneumatic  15 minutes   Vasopnuematic Location  Shoulder   Vasopneumatic Pressure Low   Vasopneumatic Temperature  3*   Manual Therapy   Manual Therapy Passive ROM   Passive ROM Rt shoulder, flex, abd and ER per protocol                  PT Short Term Goals - 01/01/16 1226    PT SHORT TERM GOAL #1   Title Independent with initial HEP ( 01/17/16)    Status New  PT SHORT TERM GOAL #2   Title demo Rt shoulder PROM to [facility] ( 01/31/16)    Status On-going   PT SHORT TERM GOAL #3   Title tolerate inital Rt shoulder strengthening exercise ( 01/31/16)    Status On-going   PT SHORT TERM GOAL #4   Title improve FOTO =/< 50% limited ( 01/31/16)    Status On-going           PT Long Term Goals - 01/01/16 1226    PT LONG TERM GOAL #1   Title Independent with advanced HEP ( 03/13/16)   Time 12   Period Weeks   Status On-going   PT LONG TERM GOAL #2   Title demo Rt shoulder AROM WFL without pain to allow her to perform some quilting. ( 03/13/16)    Time 12   Period Weeks   Status On-going   PT LONG TERM GOAL #3   Title demo Rt shoulder strength =/> 5-/5 to allow return to work ( 03/13/16)    Time 12   Period Weeks   Status On-going   PT LONG TERM GOAL #4   Title report pain no greater than 2/10 with lifitng tasks (  03/13/16)    Time 12   Period Weeks   Status On-going   PT LONG TERM GOAL #5   Title improve FOTO =/< 39% limited ( 03/13/16)    Time 12   Period Weeks   Status On-going               Plan - 01/04/16 1225    Clinical Impression Statement Pt is doing excellent with her rehab, ROM in the shoulder is progressing well.  She is being restricted currently due to the protocol.     Rehab Potential Good   PT Frequency 2x / week   PT Duration 12 weeks   PT Treatment/Interventions Ultrasound;Patient/family education;Passive range of motion;Cryotherapy;Dry needling;Electrical Stimulation;Moist Heat;Therapeutic exercise;Manual techniques;Vasopneumatic Device;Taping   PT Next Visit Plan progress per protocol able to move into next phase   Consulted and Agree with Plan of Care Patient      Patient will benefit from skilled therapeutic intervention in order to improve the following deficits and impairments:  Postural dysfunction, Decreased strength, Increased edema, Decreased scar mobility, Pain, Impaired UE functional use, Decreased range of motion  Visit Diagnosis: Pain in right shoulder  Muscle weakness (generalized)  Abnormal posture  Stiffness of right shoulder, not elsewhere classified     Problem List Patient Active Problem List   Diagnosis Date Noted  . Complete rotator cuff tear 12/01/2015  . Wheezing 04/07/2014  . Impaired fasting glucose 07/26/2013  . Essential hypertension, benign 07/26/2013  . Back pain 07/26/2013  . Knee pain, bilateral 01/10/2013  . Morbid obesity (HCC) 01/10/2013  . Headache(784.0) 12/06/2012    Roderic ScarceSusan Shaver PT 01/04/2016, 12:33 PM  [facility] [address] [place] [place], [place], [address] Phone: [phone]   Fax:  [phone]  Name: Selinda OrionRhonda Woodring MRN: [id number] Date of Birth: 1960-12-03

## 2016-01-08 ENCOUNTER — Ambulatory Visit (INDEPENDENT_AMBULATORY_CARE_PROVIDER_SITE_OTHER): Payer: BLUE CROSS/BLUE SHIELD | Admitting: Rehabilitative and Restorative Service Providers"

## 2016-01-08 ENCOUNTER — Encounter: Payer: Self-pay | Admitting: Rehabilitative and Restorative Service Providers"

## 2016-01-08 DIAGNOSIS — M25511 Pain in right shoulder: Secondary | ICD-10-CM | POA: Diagnosis not present

## 2016-01-08 DIAGNOSIS — M25611 Stiffness of right shoulder, not elsewhere classified: Secondary | ICD-10-CM

## 2016-01-08 DIAGNOSIS — R293 Abnormal posture: Secondary | ICD-10-CM | POA: Diagnosis not present

## 2016-01-08 DIAGNOSIS — M6281 Muscle weakness (generalized): Secondary | ICD-10-CM | POA: Diagnosis not present

## 2016-01-08 NOTE — Patient Instructions (Signed)
Self massage using about a 4 inch rubber ball  Gymball on Wall: Side to Side    Stand __2_ feet from wall with right hand supporting a ball on the wall. Lean into ball and move ball side to side. Repeat _30__ times.    Gymball on Wall: Clockwise / Counterclockwise    Stand __about 2_ feet from wall with right hand supporting a ball on the wall. Move ball in circles clockwise. Circle _30__ times. Clockwise and counter clockwise   Diagonal movement with ball as well - 30 each direction   Standing with hands on counter - step back to stretch shoulder hold 20 sec 3-5 reps 2-3 times/day

## 2016-01-08 NOTE — Therapy (Signed)
[facility] [place] [address] [place] [address] [place], [place], [address] Phone: [phone]   Fax:  [phone]  Physical Therapy Treatment  Patient Details  Name: Megan Petersen MRN: [id number] Date of Birth: 01-10-1961 Referring Provider: Dr. Shelle Iron   Encounter Date: 01/08/2016      PT End of Session - 01/08/16 1154    Visit Number 7   Number of Visits 24   Date for PT Re-Evaluation 03/13/16   PT Start Time 1150   PT Stop Time 1247   PT Time Calculation (min) 57 min   Activity Tolerance Patient tolerated treatment well      Past Medical History  Diagnosis Date  . Allergy   . Hypertension   . Hyperlipidemia   . Asthma   . GERD (gastroesophageal reflux disease)   . Morbid obesity (HCC)   . Osteoarthritis   . Rosacea   . Trigeminal neuralgia   . LSIL (low grade squamous intraepithelial lesion) on Pap smear   . Atypical glandular cells on Pap smear   . Primary cough headache   . Osteoarthritis   . Esophageal reflux   . Impaired fasting glucose   . Swelling of limb   . Headache   . Pneumonia 2016  . URI (upper respiratory infection)     cough with green sputum since 11-26-15  . Family history of adverse reaction to anesthesia     brother slow to awaken    Past Surgical History  Procedure Laterality Date  . Cholecystectomy    . Leep    . Right knee meniscus repair  2014  . Shoulder open rotator cuff repair Right 12/01/2015    Procedure: RIGHT SHOULDER MINI OPEN ROTATOR CUFF REPAIR WITH RESECTON OF DISTAL CLAVICLE AND SUBACROMIAL DECOMPRESSION;  Surgeon: Jene Every, MD;  Location: [facility];  Service: Orthopedics;  Laterality: Right;    There were no vitals filed for this visit.      Subjective Assessment - 01/08/16 1155    Subjective some soreness - may have slept funny - no longer sleeping in her sling   Currently in Pain? Yes   Pain Score 2    Pain Location Shoulder   Pain Orientation Right   Pain Descriptors /  Indicators Tightness   Pain Onset More than a month ago   Pain Frequency Intermittent            [facility] PT Assessment - 01/08/16 0001    Assessment   Medical Diagnosis Rt RTC repair   Referring Provider Dr. Shelle Iron    Onset Date/Surgical Date 12/01/15   Hand Dominance Right   Next MD Visit 01/16/16   PROM   Right/Left Shoulder --  supine - in scapular plane    Right Shoulder Flexion 149 Degrees   Right Shoulder ABduction 117 Degrees   Right Shoulder External Rotation 52 Degrees                     [facility] Adult PT Treatment/Exercise - 01/08/16 0001    Therapeutic Activites    Therapeutic Activities --  myofacial ball work through thoracic spine and Rt shd   Exercises   Exercises Shoulder   Shoulder Exercises: Supine   Other Supine Exercises AAROM with dowel, flex, press up,    Shoulder Exercises: Prone   Retraction Strengthening;Right  arm off EOB    Shoulder Exercises: Standing   Other Standing Exercises Wall ladder with RUE (tactile cues for shoulder)    Shoulder Exercises:  Pulleys   Flexion 2 minutes   ABduction 2 minutes  scaption   Shoulder Exercises: Therapy Ball   Other Therapy Ball Exercises 12 inch ball on wall - closed chain work circles CW/CCW; up down; side to side; diagonals ~ 30 resp each direction   Shoulder Exercises: Stretch   External Rotation Stretch 5 reps;10 seconds  standing - with cane; elbow at side with pillowcase    Table Stretch - Flexion 3 reps;20 seconds  hands resting on counter - step back/hip flexion    Other Shoulder Stretches standing AA extension & abd with dowel    Modalities   Modalities Electrical Stimulation;Vasopneumatic   Electrical Stimulation   Electrical Stimulation Location Rt shoulder and deltoid   Electrical Stimulation Action IFC   Electrical Stimulation Parameters to tolerance   Electrical Stimulation Goals Edema;Pain   Vasopneumatic   Number Minutes Vasopneumatic  15 minutes   Vasopnuematic Location   Shoulder   Vasopneumatic Pressure Low   Vasopneumatic Temperature  3*   Manual Therapy   Manual Therapy Passive ROM   Manual therapy comments pt supine    Passive ROM Rt shoulder, flex, abd and ER per protocol                PT Education - 01/08/16 1304    Education provided Yes   Education Details HEP; myofacial ball release work    Teacher, musicerson(s) Educated Patient   Methods Explanation;Demonstration;Tactile cues;Verbal cues;Handout   Comprehension Verbalized understanding;Returned demonstration;Verbal cues required;Tactile cues required          PT Short Term Goals - 01/08/16 1308    PT SHORT TERM GOAL #1   Title Independent with initial HEP ( 01/17/16)    Time 4   Period Weeks   Status On-going   PT SHORT TERM GOAL #2   Title demo Rt shoulder PROM to [facility] ( 01/31/16)    Time 6   Period Weeks   Status On-going   PT SHORT TERM GOAL #3   Title tolerate inital Rt shoulder strengthening exercise ( 01/31/16)    Time 6   Period Weeks   Status On-going   PT SHORT TERM GOAL #4   Title improve FOTO =/< 50% limited ( 01/31/16)    Time 6   Period Weeks   Status On-going           PT Long Term Goals - 01/08/16 1309    PT LONG TERM GOAL #1   Title Independent with advanced HEP ( 03/13/16)   Time 12   Period Weeks   Status On-going   PT LONG TERM GOAL #2   Title demo Rt shoulder AROM WFL without pain to allow her to perform some quilting. ( 03/13/16)    Time 12   Period Weeks   Status On-going   PT LONG TERM GOAL #3   Title demo Rt shoulder strength =/> 5-/5 to allow return to work ( 03/13/16)    Time 12   Period Weeks   Status On-going   PT LONG TERM GOAL #4   Title report pain no greater than 2/10 with lifitng tasks ( 03/13/16)    Time 12   Period Weeks   Status On-going   PT LONG TERM GOAL #5   Title improve FOTO =/< 39% limited ( 03/13/16)    Time 12   Period Weeks   Status On-going               Plan - 01/08/16 1306  Clinical Impression Statement  Progressing well with rehab - on track per protocol. Working on full ROM with gradual progress noted in measurements.    Rehab Potential Good   PT Frequency 2x / week   PT Duration 12 weeks   PT Treatment/Interventions Ultrasound;Patient/family education;Passive range of motion;Cryotherapy;Dry needling;Electrical Stimulation;Moist Heat;Therapeutic exercise;Manual techniques;Vasopneumatic Device;Taping   PT Next Visit Plan progress per protocol able to move into next phase   PT Home Exercise Plan HEP   Consulted and Agree with Plan of Care Patient      Patient will benefit from skilled therapeutic intervention in order to improve the following deficits and impairments:  Postural dysfunction, Decreased strength, Increased edema, Decreased scar mobility, Pain, Impaired UE functional use, Decreased range of motion  Visit Diagnosis: Pain in right shoulder  Muscle weakness (generalized)  Abnormal posture  Stiffness of right shoulder, not elsewhere classified     Problem List Patient Active Problem List   Diagnosis Date Noted  . Complete rotator cuff tear 12/01/2015  . Wheezing 04/07/2014  . Impaired fasting glucose 07/26/2013  . Essential hypertension, benign 07/26/2013  . Back pain 07/26/2013  . Knee pain, bilateral 01/10/2013  . Morbid obesity (HCC) 01/10/2013  . Headache(784.0) 12/06/2012    Jayleen Afonso Rober Minion PT, MPH  01/08/2016, 1:10 PM  [facility] [address] [place] [place], [place], [address] Phone: [phone]   Fax:  [phone]  Name: Megan Petersen MRN: [id number] Date of Birth: Jun 09, 1961

## 2016-01-11 ENCOUNTER — Encounter: Payer: Self-pay | Admitting: Physical Therapy

## 2016-01-11 ENCOUNTER — Ambulatory Visit (INDEPENDENT_AMBULATORY_CARE_PROVIDER_SITE_OTHER): Payer: BLUE CROSS/BLUE SHIELD | Admitting: Physical Therapy

## 2016-01-11 DIAGNOSIS — R293 Abnormal posture: Secondary | ICD-10-CM | POA: Diagnosis not present

## 2016-01-11 DIAGNOSIS — M25611 Stiffness of right shoulder, not elsewhere classified: Secondary | ICD-10-CM

## 2016-01-11 DIAGNOSIS — M25511 Pain in right shoulder: Secondary | ICD-10-CM

## 2016-01-11 DIAGNOSIS — M6281 Muscle weakness (generalized): Secondary | ICD-10-CM | POA: Diagnosis not present

## 2016-01-11 NOTE — Therapy (Signed)
[facility] [place] [address] [place], [place], [address] Phone: [phone]   Fax:  [phone]  Physical Therapy Treatment  Patient Details  Name: Megan Petersen MRN: [id number] Date of Birth: Aug 18, 1961 Referring Provider: Dr Shelle Iron  Encounter Date: 01/11/2016      PT End of Session - 01/11/16 1151    Visit Number 8   Number of Visits 24   Date for PT Re-Evaluation 03/13/16   PT Start Time 1151   PT Stop Time 1245   PT Time Calculation (min) 54 min   Activity Tolerance Patient tolerated treatment well      Past Medical History  Diagnosis Date  . Allergy   . Hypertension   . Hyperlipidemia   . Asthma   . GERD (gastroesophageal reflux disease)   . Morbid obesity (HCC)   . Osteoarthritis   . Rosacea   . Trigeminal neuralgia   . LSIL (low grade squamous intraepithelial lesion) on Pap smear   . Atypical glandular cells on Pap smear   . Primary cough headache   . Osteoarthritis   . Esophageal reflux   . Impaired fasting glucose   . Swelling of limb   . Headache   . Pneumonia 2016  . URI (upper respiratory infection)     cough with green sputum since 11-26-15  . Family history of adverse reaction to anesthesia     brother slow to awaken    Past Surgical History  Procedure Laterality Date  . Cholecystectomy    . Leep    . Right knee meniscus repair  2014  . Shoulder open rotator cuff repair Right 12/01/2015    Procedure: RIGHT SHOULDER MINI OPEN ROTATOR CUFF REPAIR WITH RESECTON OF DISTAL CLAVICLE AND SUBACROMIAL DECOMPRESSION;  Surgeon: Jene Every, MD;  Location: [facility];  Service: Orthopedics;  Laterality: Right;    There were no vitals filed for this visit.      Subjective Assessment - 01/11/16 1152    Subjective Doing pretty good, only having stiffness   Patient Stated Goals sleep again, hold her grand daughters, ( 74 month old)    Currently in Pain? No/denies            [facility] PT Assessment -  01/11/16 0001    Assessment   Medical Diagnosis Rt RTC repair   Referring Provider Dr Shelle Iron   Onset Date/Surgical Date 12/01/15   Hand Dominance Right   Next MD Visit 01/16/16                     [facility] Adult PT Treatment/Exercise - 01/11/16 0001    Shoulder Exercises: Supine   External Rotation AAROM;Right  stretch holding 1# wt, 3x1'   Other Supine Exercises AAROM with dowel overhead, horizontal abduction, ER    Other Supine Exercises 3x10 biceps 4#   Shoulder Exercises: Standing   Flexion AAROM;Right;10 reps  shoulder ladder, VC's to keep shoulders back   ABduction AAROM;Right;10 reps  shoulder ladder, VC for posture   Extension AAROM;Right  holding dowel behind back   Shoulder Exercises: ROM/Strengthening   UBE (Upper Arm Bike) L1 x 4' alt FWD/BWD   Shoulder Exercises: Stretch   External Rotation Stretch 3 reps;30 seconds  with hand on wall   Modalities   Modalities Electrical Stimulation;Vasopneumatic   Electrical Stimulation   Electrical Stimulation Location Rt shoulder and deltoid   Electrical Stimulation Action IFC   Electrical Stimulation Parameters to tolerance   Electrical Stimulation Goals Edema;Pain  Vasopneumatic   Number Minutes Vasopneumatic  15 minutes   Vasopnuematic Location  Shoulder   Vasopneumatic Pressure Low   Vasopneumatic Temperature  3*                  PT Short Term Goals - 01/08/16 1308    PT SHORT TERM GOAL #1   Title Independent with initial HEP ( 01/17/16)    Time 4   Period Weeks   Status On-going   PT SHORT TERM GOAL #2   Title [facility] ( 01/31/16)    Time 6   Period Weeks   Status On-going   PT SHORT TERM GOAL #3   Title tolerate inital Rt shoulder strengthening exercise ( 01/31/16)    Time 6   Period Weeks   Status On-going   PT SHORT TERM GOAL #4   Title improve FOTO =/< 50% limited ( 01/31/16)    Time 6   Period Weeks   Status On-going           PT Long Term Goals - 01/08/16  1309    PT LONG TERM GOAL #1   Title Independent with advanced HEP ( 03/13/16)   Time 12   Period Weeks   Status On-going   PT LONG TERM GOAL #2   Title demo Rt shoulder AROM WFL without pain to allow her to perform some quilting. ( 03/13/16)    Time 12   Period Weeks   Status On-going   PT LONG TERM GOAL #3   Title demo Rt shoulder strength =/> 5-/5 to allow return to work ( 03/13/16)    Time 12   Period Weeks   Status On-going   PT LONG TERM GOAL #4   Title report pain no greater than 2/10 with lifitng tasks ( 03/13/16)    Time 12   Period Weeks   Status On-going   PT LONG TERM GOAL #5   Title improve FOTO =/< 39% limited ( 03/13/16)    Time 12   Period Weeks   Status On-going               Plan - 01/11/16 1233    Clinical Impression Statement Pamelia HoitRonda continues to do very well with her shoulder rehab.  Her ROM is improving and pain has been well controlled. She has developed a trigger point in her Rt upper trap and may benefit from TDN to this if it persists.    Rehab Potential Good   PT Frequency 2x / week   PT Duration 12 weeks   PT Treatment/Interventions Ultrasound;Patient/family education;Passive range of motion;Cryotherapy;Dry needling;Electrical Stimulation;Moist Heat;Therapeutic exercise;Manual techniques;Vasopneumatic Device;Taping   PT Next Visit Plan FOTO and write MD note, has an appointment on Tuesday.    Consulted and Agree with Plan of Care Patient      Patient will benefit from skilled therapeutic intervention in order to improve the following deficits and impairments:  Postural dysfunction, Decreased strength, Increased edema, Decreased scar mobility, Pain, Impaired UE functional use, Decreased range of motion  Visit Diagnosis: Pain in right shoulder  Muscle weakness (generalized)  Abnormal posture  Stiffness of right shoulder, not elsewhere classified     Problem List Patient Active Problem List   Diagnosis Date Noted  . Complete rotator  cuff tear 12/01/2015  . Wheezing 04/07/2014  . Impaired fasting glucose 07/26/2013  . Essential hypertension, benign 07/26/2013  . Back pain 07/26/2013  . Knee pain, bilateral 01/10/2013  . Morbid  obesity (HCC) 01/10/2013  . Headache(784.0) 12/06/2012    Roderic Scarce PT 01/11/2016, 12:35 PM  [facility] [address] [place], [place], [address] Phone: [phone]   Fax:  [phone]  Name: Dearia Wilmouth MRN: [id number] Date of Birth: 06/24/1961

## 2016-01-15 ENCOUNTER — Ambulatory Visit (INDEPENDENT_AMBULATORY_CARE_PROVIDER_SITE_OTHER): Payer: BLUE CROSS/BLUE SHIELD | Admitting: Rehabilitative and Restorative Service Providers"

## 2016-01-15 ENCOUNTER — Encounter: Payer: Self-pay | Admitting: Rehabilitative and Restorative Service Providers"

## 2016-01-15 DIAGNOSIS — M25511 Pain in right shoulder: Secondary | ICD-10-CM

## 2016-01-15 DIAGNOSIS — M25611 Stiffness of right shoulder, not elsewhere classified: Secondary | ICD-10-CM | POA: Diagnosis not present

## 2016-01-15 DIAGNOSIS — R293 Abnormal posture: Secondary | ICD-10-CM

## 2016-01-15 DIAGNOSIS — M6281 Muscle weakness (generalized): Secondary | ICD-10-CM | POA: Diagnosis not present

## 2016-01-15 NOTE — Patient Instructions (Signed)
Cane Exercise: Extension / Internal Rotation    Stand holding cane behind back with both hands palm-up. Slide cane up spine toward head. Hold _20___ seconds. Repeat __3__ times. Do _2___ sessions per day. Keep shoulders level  SHOULDER: Abduction (Isometric)    Use wall as resistance. Press arm against pillow. Keep elbow bent. Hold __5-10_ seconds. _10__ reps per set, _1-2__ sets per day

## 2016-01-15 NOTE — Therapy (Signed)
[facility] [address] [place], [place], [address] Phone: [phone]   Fax:  [phone]  Physical Therapy Treatment  Patient Details  Name: Megan Petersen MRN: [id number] Date of Birth: [birth year] Referring Provider: Dr. Shelle IronBeane  Encounter Date: 01/15/2016      PT End of Session - 01/15/16 1153    Visit Number 9   Number of Visits 24   Date for PT Re-Evaluation 03/13/16   PT Start Time 1149   PT Stop Time 1246   PT Time Calculation (min) 57 min   Activity Tolerance Patient tolerated treatment well      Past Medical History  Diagnosis Date  . Allergy   . Hypertension   . Hyperlipidemia   . Asthma   . GERD (gastroesophageal reflux disease)   . Morbid obesity (HCC)   . Osteoarthritis   . Rosacea   . Trigeminal neuralgia   . LSIL (low grade squamous intraepithelial lesion) on Pap smear   . Atypical glandular cells on Pap smear   . Primary cough headache   . Osteoarthritis   . Esophageal reflux   . Impaired fasting glucose   . Swelling of limb   . Headache   . Pneumonia 2016  . URI (upper respiratory infection)     cough with green sputum since 11-26-15  . Family history of adverse reaction to anesthesia     brother slow to awaken    Past Surgical History  Procedure Laterality Date  . Cholecystectomy    . Leep    . Right knee meniscus repair  2014  . Shoulder open rotator cuff repair Right 12/01/2015    Procedure: RIGHT SHOULDER MINI OPEN ROTATOR CUFF REPAIR WITH RESECTON OF DISTAL CLAVICLE AND SUBACROMIAL DECOMPRESSION;  Surgeon: Jene EveryJeffrey Beane, MD;  Location: [facility];  Service: Orthopedics;  Laterality: Right;    There were no vitals filed for this visit.      Subjective Assessment - 01/15/16 1154    Subjective Sees MD tomorrow - shoulder is doing "pretty good" She is working on exercises at home.    Currently in Pain? Yes   Pain Score 2    Pain Location Shoulder   Pain Orientation Right   Pain  Descriptors / Indicators Aching   Pain Type Surgical pain   Pain Onset More than a month ago   Pain Frequency Intermittent   Aggravating Factors  sleeping and moving her arm wrong   Pain Relieving Factors ice            [facility] PT Assessment - 01/15/16 0001    Assessment   Medical Diagnosis Rt RTC repair   Referring Provider Dr. Shelle IronBeane   Onset Date/Surgical Date 12/01/15   Hand Dominance Right   Next MD Visit 01/16/16   Observation/Other Assessments   Focus on Therapeutic Outcomes (FOTO)  42% limitation    AROM   Right/Left Shoulder --  AROM Rt shd in standing    Right Shoulder Extension 46 Degrees   Right Shoulder Flexion 136 Degrees   Right Shoulder ABduction 137 Degrees   Right Shoulder Internal Rotation 57 Degrees   Right Shoulder External Rotation 31 Degrees   PROM   Right/Left Shoulder --  sitting   Right Shoulder Flexion 152 Degrees   Right Shoulder ABduction 141 Degrees   Right Shoulder Internal Rotation 78 Degrees   Right Shoulder External Rotation 37 Degrees   Strength   Overall Strength Comments NA  [facility] Adult PT Treatment/Exercise - 01/15/16 0001    Shoulder Exercises: Seated   Other Seated Exercises bicep flex/ext 4# x10   Shoulder Exercises: Standing   Flexion AAROM;Right;10 reps  shoulder ladder, VC's to keep shoulders back   ABduction AAROM;Right;10 reps  shoulder ladder, VC for posture   Extension AAROM;Right  holding dowel behind back   Shoulder Exercises: Therapy Ball   Other Therapy Ball Exercises 12 inch ball on wall - closed chain work circles CW/CCW; up down; side to side; diagonals ~ 30 resp each direction   Shoulder Exercises: ROM/Strengthening   UBE (Upper Arm Bike) L1 x 4' alt FWD/BWD   Shoulder Exercises: Isometric Strengthening   ABduction --  5 sec x 10 standing at wall    Shoulder Exercises: Stretch   Internal Rotation Stretch 3 reps  20 sec    External Rotation Stretch 3 reps;30 seconds  with  hand on wall   Table Stretch - Flexion 3 reps;20 seconds   Other Shoulder Stretches standing AA extension & abd with dowel    Modalities   Modalities Electrical Stimulation;Vasopneumatic   Electrical Stimulation   Electrical Stimulation Location Rt shoulder and deltoid   Electrical Stimulation Action IFC    Electrical Stimulation Parameters to tolerance    Electrical Stimulation Goals Edema;Pain   Vasopneumatic   Number Minutes Vasopneumatic  15 minutes   Vasopnuematic Location  Shoulder   Vasopneumatic Pressure Low   Vasopneumatic Temperature  3*                PT Education - 01/15/16 1231    Education provided Yes   Education Details HEP    Person(s) Educated Patient   Methods Explanation   Comprehension Verbalized understanding          PT Short Term Goals - 01/15/16 1243    PT SHORT TERM GOAL #1   Title Independent with initial HEP ( 01/17/16)    Time 4   Period Weeks   Status Achieved   PT SHORT TERM GOAL #2   Title demo Rt shoulder PROM to [facility] ( 01/31/16)    Time 6   Period Weeks   Status On-going   PT SHORT TERM GOAL #3   Title tolerate inital Rt shoulder strengthening exercise ( 01/31/16)    Time 6   Period Weeks   Status On-going   PT SHORT TERM GOAL #4   Title improve FOTO =/< 50% limited ( 01/31/16)    Time 6   Period Weeks   Status Achieved           PT Long Term Goals - 01/15/16 1244    PT LONG TERM GOAL #1   Title Independent with advanced HEP ( 03/13/16)   Time 12   Period Weeks   Status On-going   PT LONG TERM GOAL #2   Title demo Rt shoulder AROM WFL without pain to allow her to perform some quilting. ( 03/13/16)    Time 12   Period Weeks   Status On-going   PT LONG TERM GOAL #3   Title demo Rt shoulder strength =/> 5-/5 to allow return to work ( 03/13/16)    Time 12   Period Weeks   Status On-going   PT LONG TERM GOAL #4   Title report pain no greater than 2/10 with lifitng tasks ( 03/13/16)    Time 12   Period Weeks   Status  On-going   PT LONG TERM GOAL #5  Title improve FOTO =/< 39% limited ( 03/13/16)    Time 12   Period Weeks   Status On-going               Plan - 01/15/16 1239    Clinical Impression Statement Megan Petersen continues to progress with rehab per protocol. She demonstrates increasing ROM and is progressing with exercise program per protol without difficulty. Progressing toward stated goals of rehab.    Rehab Potential Good   PT Frequency 2x / week   PT Duration 12 weeks   PT Treatment/Interventions Ultrasound;Patient/family education;Passive range of motion;Cryotherapy;Dry needling;Electrical Stimulation;Moist Heat;Therapeutic exercise;Manual techniques;Vasopneumatic Device;Taping   PT Next Visit Plan continue rehab per protocol    PT Home Exercise Plan HEP   Consulted and Agree with Plan of Care Patient      Patient will benefit from skilled therapeutic intervention in order to improve the following deficits and impairments:  Postural dysfunction, Decreased strength, Increased edema, Decreased scar mobility, Pain, Impaired UE functional use, Decreased range of motion  Visit Diagnosis: Pain in right shoulder  Muscle weakness (generalized)  Abnormal posture  Stiffness of right shoulder, not elsewhere classified     Problem List Patient Active Problem List   Diagnosis Date Noted  . Complete rotator cuff tear 12/01/2015  . Wheezing 04/07/2014  . Impaired fasting glucose 07/26/2013  . Essential hypertension, benign 07/26/2013  . Back pain 07/26/2013  . Knee pain, bilateral 01/10/2013  . Morbid obesity (HCC) 01/10/2013  . Headache(784.0) 12/06/2012    Megan Petersen PT, MPH  01/15/2016, 12:51 PM  [facility] [address] [place], [place], [address] Phone: [phone]   Fax:  [phone]  Name: Megan Petersen MRN: [id number] Date of Birth: October 25, 1960

## 2016-01-18 ENCOUNTER — Encounter: Payer: Self-pay | Admitting: Physical Therapy

## 2016-01-18 ENCOUNTER — Ambulatory Visit (INDEPENDENT_AMBULATORY_CARE_PROVIDER_SITE_OTHER): Payer: BLUE CROSS/BLUE SHIELD | Admitting: Physical Therapy

## 2016-01-18 DIAGNOSIS — R293 Abnormal posture: Secondary | ICD-10-CM

## 2016-01-18 DIAGNOSIS — M6281 Muscle weakness (generalized): Secondary | ICD-10-CM | POA: Diagnosis not present

## 2016-01-18 DIAGNOSIS — M25611 Stiffness of right shoulder, not elsewhere classified: Secondary | ICD-10-CM

## 2016-01-18 DIAGNOSIS — M25511 Pain in right shoulder: Secondary | ICD-10-CM

## 2016-01-18 NOTE — Patient Instructions (Addendum)
Issued Rockwood 4's with yellow band  Trigger Point Dry Needling  . What is Trigger Point Dry Needling (DN)? o DN is a physical therapy technique used to treat muscle pain and dysfunction. Specifically, DN helps deactivate muscle trigger points (muscle knots).  o A thin filiform needle is used to penetrate the skin and stimulate the underlying trigger point. The goal is for a local twitch response (LTR) to occur and for the trigger point to relax. No medication of any kind is injected during the procedure.   . What Does Trigger Point Dry Needling Feel Like?  o The procedure feels different for each individual patient. Some patients report that they do not actually feel the needle enter the skin and overall the process is not painful. Very mild bleeding may occur. However, many patients feel a deep cramping in the muscle in which the needle was inserted. This is the local twitch response.   Marland Kitchen. How Will I feel after the treatment? o Soreness is normal, and the onset of soreness may not occur for a few hours. Typically this soreness does not last longer than two days.  o Bruising is uncommon, however; ice can be used to decrease any possible bruising.  o In rare cases feeling tired or nauseous after the treatment is normal. In addition, your symptoms may get worse before they get better, this period will typically not last longer than 24 hours.   . What Can I do After My Treatment? o Increase your hydration by drinking more water for the next 24 hours. o You may place ice or heat on the areas treated that have become sore, however, do not use heat on inflamed or bruised areas. Heat often brings more relief post needling. o You can continue your regular activities, but vigorous activity is not recommended initially after the treatment for 24 hours. o DN is best combined with other physical therapy such as strengthening, stretching, and other therapies.

## 2016-01-18 NOTE — Therapy (Signed)
[facility] [place] [address] [place], [place], [address] Phone: [phone]   Fax:  [phone]  Physical Therapy Treatment  Patient Details  Name: Megan Petersen MRN: [id number] Date of Birth: 10/08/1960 Referring Provider: Dr. Shelle Iron  Encounter Date: 01/18/2016      PT End of Session - 01/18/16 1114    Visit Number 10   Number of Visits 24   Date for PT Re-Evaluation 03/13/16   PT Start Time 1101   PT Stop Time 1208   PT Time Calculation (min) 67 min   Activity Tolerance Patient tolerated treatment well      Past Medical History  Diagnosis Date  . Allergy   . Hypertension   . Hyperlipidemia   . Asthma   . GERD (gastroesophageal reflux disease)   . Morbid obesity (HCC)   . Osteoarthritis   . Rosacea   . Trigeminal neuralgia   . LSIL (low grade squamous intraepithelial lesion) on Pap smear   . Atypical glandular cells on Pap smear   . Primary cough headache   . Osteoarthritis   . Esophageal reflux   . Impaired fasting glucose   . Swelling of limb   . Headache   . Pneumonia 2016  . URI (upper respiratory infection)     cough with green sputum since 11-26-15  . Family history of adverse reaction to anesthesia     brother slow to awaken    Past Surgical History  Procedure Laterality Date  . Cholecystectomy    . Leep    . Right knee meniscus repair  2014  . Shoulder open rotator cuff repair Right 12/01/2015    Procedure: RIGHT SHOULDER MINI OPEN ROTATOR CUFF REPAIR WITH RESECTON OF DISTAL CLAVICLE AND SUBACROMIAL DECOMPRESSION;  Surgeon: Jene Every, MD;  Location: [facility];  Service: Orthopedics;  Laterality: Right;    There were no vitals filed for this visit.      Subjective Assessment - 01/18/16 1114    Subjective Saw the MD, he is very pleased and said she can start adding in some weight.    Patient Stated Goals sleep again, hold her grand daughters, ( 11 month old)    Currently in Pain? Yes   Pain  Location Shoulder   Pain Orientation Right   Pain Descriptors / Indicators Tightness   Pain Type Surgical pain   Pain Onset More than a month ago   Pain Frequency Intermittent   Aggravating Factors  moving the arm the wrong way   Pain Relieving Factors ice            [facility] PT Assessment - 01/18/16 0001    Assessment   Medical Diagnosis Rt RTC repair   Next MD Visit 03/19/16                     [facility] Adult PT Treatment/Exercise - 01/18/16 0001    Shoulder Exercises: Prone   Extension Strengthening;Right;10 reps  arm off edge of bed   Other Prone Exercises T's Rt UE off EOB   Other Prone Exercises POE serratus pushes   Shoulder Exercises: Standing   Flexion AAROM;Right;10 reps   ABduction AAROM;Right;10 reps   Extension 10 reps;Strengthening;Right;Theraband   Theraband Level (Shoulder Extension) Level 1 (Yellow)   Other Standing Exercises Rockwood 4's, towel under arm for ER, yellow band   Shoulder Exercises: ROM/Strengthening   UBE (Upper Arm Bike) L1 x 4' alt FWD/BWD   Modalities   Modalities Vasopneumatic;Electrical Stimulation;Moist  Heat   Moist Heat Therapy   Number Minutes Moist Heat 15 Minutes   Moist Heat Location --  Rt upper trap   Electrical Stimulation   Electrical Stimulation Location Rt shoulder   Electrical Stimulation Action IFC   Electrical Stimulation Parameters to tolerance   Electrical Stimulation Goals Edema;Pain   Vasopneumatic   Number Minutes Vasopneumatic  15 minutes   Vasopnuematic Location  Shoulder   Vasopneumatic Pressure Low   Vasopneumatic Temperature  3*   Manual Therapy   Soft tissue mobilization Rt upper trap   Passive ROM Rt shoulder ER holding 2# wt 5x60sec          Trigger Point Dry Needling - 01/18/16 1155    Consent Given? Yes   Education Handout Provided Yes   Muscles Treated Upper Body Upper trapezius  Rt   Upper Trapezius Response Twitch reponse elicited;Palpable increased muscle length               PT Education - 01/18/16 1123    Education provided Yes   Education Details rockwood 4's   Person(s) Educated Patient   Methods Explanation;Demonstration;Handout   Comprehension Returned demonstration          PT Short Term Goals - 01/15/16 1243    PT SHORT TERM GOAL #1   Title Independent with initial HEP ( 01/17/16)    Time 4   Period Weeks   Status Achieved   PT SHORT TERM GOAL #2   Title [facility] ( 01/31/16)    Time 6   Period Weeks   Status On-going   PT SHORT TERM GOAL #3   Title tolerate inital Rt shoulder strengthening exercise ( 01/31/16)    Time 6   Period Weeks   Status On-going   PT SHORT TERM GOAL #4   Title improve FOTO =/< 50% limited ( 01/31/16)    Time 6   Period Weeks   Status Achieved           PT Long Term Goals - 01/15/16 1244    PT LONG TERM GOAL #1   Title Independent with advanced HEP ( 03/13/16)   Time 12   Period Weeks   Status On-going   PT LONG TERM GOAL #2   Title demo Rt shoulder AROM WFL without pain to allow her to perform some quilting. ( 03/13/16)    Time 12   Period Weeks   Status On-going   PT LONG TERM GOAL #3   Title demo Rt shoulder strength =/> 5-/5 to allow return to work ( 03/13/16)    Time 12   Period Weeks   Status On-going   PT LONG TERM GOAL #4   Title report pain no greater than 2/10 with lifitng tasks ( 03/13/16)    Time 12   Period Weeks   Status On-going   PT LONG TERM GOAL #5   Title improve FOTO =/< 39% limited ( 03/13/16)    Time 12   Period Weeks   Status On-going               Plan - 01/18/16 1157    Clinical Impression Statement [name] continues to do very well. Md said it was ok to stop wearing the sling and begin light resistance.    Rehab Potential Good   PT Frequency 2x / week   PT Duration 12 weeks   PT Treatment/Interventions Ultrasound;Patient/family education;Passive range of motion;Cryotherapy;Dry needling;Electrical Stimulation;Moist  Heat;Therapeutic exercise;Manual techniques;Vasopneumatic  Device;Taping   PT Next Visit Plan continue rehab per protocol    Consulted and Agree with Plan of Care Patient      Patient will benefit from skilled therapeutic intervention in order to improve the following deficits and impairments:  Postural dysfunction, Decreased strength, Increased edema, Decreased scar mobility, Pain, Impaired UE functional use, Decreased range of motion  Visit Diagnosis: Pain in right shoulder  Muscle weakness (generalized)  Abnormal posture  Stiffness of right shoulder, not elsewhere classified     Problem List Patient Active Problem List   Diagnosis Date Noted  . Complete rotator cuff tear 12/01/2015  . Wheezing 04/07/2014  . Impaired fasting glucose 07/26/2013  . Essential hypertension, benign 07/26/2013  . Back pain 07/26/2013  . Knee pain, bilateral 01/10/2013  . Morbid obesity (HCC) 01/10/2013  . Headache(784.0) 12/06/2012    Roderic Scarce PT 01/18/2016, 11:58 AM  [facility] [address] [place], [place], [address] Phone: [phone]   Fax:  [phone]  Name: Megan Petersen MRN: [id number] Date of Birth: 05/13/1961

## 2016-01-23 ENCOUNTER — Encounter: Payer: Self-pay | Admitting: Physical Therapy

## 2016-01-23 ENCOUNTER — Ambulatory Visit (INDEPENDENT_AMBULATORY_CARE_PROVIDER_SITE_OTHER): Payer: BLUE CROSS/BLUE SHIELD | Admitting: Physical Therapy

## 2016-01-23 DIAGNOSIS — M25611 Stiffness of right shoulder, not elsewhere classified: Secondary | ICD-10-CM

## 2016-01-23 DIAGNOSIS — R293 Abnormal posture: Secondary | ICD-10-CM | POA: Diagnosis not present

## 2016-01-23 DIAGNOSIS — M6281 Muscle weakness (generalized): Secondary | ICD-10-CM | POA: Diagnosis not present

## 2016-01-23 DIAGNOSIS — M25511 Pain in right shoulder: Secondary | ICD-10-CM | POA: Diagnosis not present

## 2016-01-23 NOTE — Therapy (Signed)
[place], [place], [address] Phone: [phone]   Fax:  [phone]  Physical Therapy Treatment  Patient Details  Name: Megan Petersen MRN: [id number] Date of Birth: [birth year] Referring Provider: Dr Tonita Cong  Encounter Date: 01/23/2016      PT End of Session - 01/23/16 1104    Visit Number 11   Number of Visits 24   Date for PT Re-Evaluation 03/13/16   PT Start Time 1104   PT Stop Time 1159   PT Time Calculation (min) 55 min   Activity Tolerance Patient tolerated treatment well      Past Medical History  Diagnosis Date  . Allergy   . Hypertension   . Hyperlipidemia   . Asthma   . GERD (gastroesophageal reflux disease)   . Morbid obesity ([place])   . Osteoarthritis   . Rosacea   . Trigeminal neuralgia   . LSIL (low grade squamous intraepithelial lesion) on Pap smear   . Atypical glandular cells on Pap smear   . Primary cough headache   . Osteoarthritis   . Esophageal reflux   . Impaired fasting glucose   . Swelling of limb   . Headache   . Pneumonia 2016  . URI (upper respiratory infection)     cough with green sputum since 11-26-15  . Family history of adverse reaction to anesthesia     brother slow to awaken    Past Surgical History  Procedure Laterality Date  . Cholecystectomy    . Leep    . Right knee meniscus repair  2014  . Shoulder open rotator cuff repair Right 12/01/2015    Procedure: RIGHT SHOULDER MINI OPEN ROTATOR CUFF REPAIR WITH RESECTON OF DISTAL CLAVICLE AND SUBACROMIAL DECOMPRESSION;  Surgeon: Susa Day, MD;  Location: [facility];  Service: Orthopedics;  Laterality: Right;    There were no vitals filed for this visit.      Subjective Assessment - 01/23/16 1105    Subjective State she is doing ok, some pain on the  top of her shoulder at incision. She thinks the needling helped loosen the muscles up.    Currently in Pain? Yes   Pain Score 3    Pain Location  Shoulder   Pain Orientation Right   Pain Descriptors / Indicators Aching   Pain Type Surgical pain   Pain Radiating Towards top of Rt shoulder   Pain Onset More than a month ago   Pain Frequency Intermittent   Aggravating Factors  moving the arm the wrong way   Pain Relieving Factors ice,  TENS            [facility] PT Assessment - 01/23/16 0001    Assessment   Medical Diagnosis Rt RTC repair   Referring Provider Dr Tonita Cong   Onset Date/Surgical Date 12/01/15   Hand Dominance Right   Next MD Visit 03/19/16   ROM / Strength   AROM / PROM / Strength AROM;Strength   AROM   AROM Assessment Site Shoulder   Right/Left Shoulder Right   Right Shoulder Flexion 156 Degrees   Right Shoulder ABduction 175 Degrees   Right Shoulder Internal Rotation 88 Degrees   Right Shoulder External Rotation 65 Degrees   PROM   Right Shoulder External Rotation 73 Degrees   Strength   Strength Assessment Site --                     [facility] Adult PT Treatment/Exercise -  01/23/16 0001    Shoulder Exercises: Prone   Retraction Strengthening;Right  T's 3x10 reps, arm off EOB   Flexion Strengthening;Right;Weights  3x10   Flexion Weight (lbs) 1   Extension Strengthening;Right  3x10 arm off EOB   Horizontal ABduction 1 --  3x8 yellow band, horizontal abd   Shoulder Exercises: Sidelying   External Rotation Strengthening;Right;Weights  3x10, towel under arm   External Rotation Weight (lbs) 1   Other Sidelying Exercises empty can Rt UE, 3x10, no weight   Shoulder Exercises: ROM/Strengthening   UBE (Upper Arm Bike) L1 x 4' alt FWD/BWD   Shoulder Exercises: Stretch   Other Shoulder Stretches overhead stretch with strap in supine, behind back stretch with strap in standing.    Modalities   Modalities Technical brewer Action IFC   Electrical Stimulation Parameters  to tolerance    Electrical Stimulation Goals Pain   Vasopneumatic   Number Minutes Vasopneumatic  15 minutes   Vasopnuematic Location  Shoulder   Vasopneumatic Pressure Low   Vasopneumatic Temperature  3*                  PT Short Term Goals - 01/23/16 1118    PT SHORT TERM GOAL #1   Title Independent with initial HEP ( 01/17/16)    Time 4   Period Weeks   Status Achieved   PT SHORT TERM GOAL #2   Title demo Rt shoulder PROM to [facility] ( 01/31/16)    Status Partially Met   PT SHORT TERM GOAL #3   Title tolerate inital Rt shoulder strengthening exercise ( 01/31/16)    Status Achieved   PT SHORT TERM GOAL #4   Title improve FOTO =/< 50% limited ( 01/31/16)    Status On-going           PT Long Term Goals - 01/23/16 1119    PT LONG TERM GOAL #1   Title Independent with advanced HEP ( 03/13/16)   Time 12   Period Weeks   Status On-going   PT LONG TERM GOAL #2   Title demo Rt shoulder AROM WFL without pain to allow her to perform some quilting. ( 03/13/16)    Time 12   Period Weeks   Status On-going   PT LONG TERM GOAL #3   Title demo Rt shoulder strength =/> 5-/5 to allow return to work ( 03/13/16)    Time 12   Period Weeks   Status On-going   PT LONG TERM GOAL #4   Title report pain no greater than 2/10 with lifitng tasks ( 03/13/16)    Time 12   Period Weeks   Status On-going   PT LONG TERM GOAL #5   Title improve FOTO =/< 39% limited ( 03/13/16)    Time 12   Period Weeks   Status On-going               Plan - 01/23/16 1144    Clinical Impression Statement Megan Petersen continues to have improvements, progressing to all her goals. Tolerating slow increase in resistance exercise.    Rehab Potential Good   PT Frequency 2x / week   PT Duration 12 weeks   PT Treatment/Interventions Ultrasound;Patient/family education;Passive range of motion;Cryotherapy;Dry needling;Electrical Stimulation;Moist Heat;Therapeutic exercise;Manual techniques;Vasopneumatic Device;Taping   PT Next Visit  Plan continue rehab per protocol    Consulted and Agree with Plan of Care Patient  Patient will benefit from skilled therapeutic intervention in order to improve the following deficits and impairments:  Postural dysfunction, Decreased strength, Increased edema, Decreased scar mobility, Pain, Impaired UE functional use, Decreased range of motion  Visit Diagnosis: Pain in right shoulder  Muscle weakness (generalized)  Abnormal posture  Stiffness of right shoulder, not elsewhere classified     Problem List Patient Active Problem List   Diagnosis Date Noted  . Complete rotator cuff tear 12/01/2015  . Wheezing 04/07/2014  . Impaired fasting glucose 07/26/2013  . Essential hypertension, benign 07/26/2013  . Back pain 07/26/2013  . Knee pain, bilateral 01/10/2013  . Morbid obesity ([place]) 01/10/2013  . Headache(784.0) 12/06/2012    Jeral Pinch PT  01/23/2016, 11:46 AM  [facility] [place], [place], [address] Phone: [phone]   Fax:  [phone]  Name: Megan Petersen MRN: [id number] Date of Birth: [birth year]

## 2016-01-26 ENCOUNTER — Encounter: Payer: BLUE CROSS/BLUE SHIELD | Admitting: Rehabilitative and Restorative Service Providers"

## 2016-01-29 ENCOUNTER — Ambulatory Visit (INDEPENDENT_AMBULATORY_CARE_PROVIDER_SITE_OTHER): Payer: BLUE CROSS/BLUE SHIELD | Admitting: Rehabilitative and Restorative Service Providers"

## 2016-01-29 ENCOUNTER — Encounter: Payer: Self-pay | Admitting: Rehabilitative and Restorative Service Providers"

## 2016-01-29 DIAGNOSIS — M6281 Muscle weakness (generalized): Secondary | ICD-10-CM

## 2016-01-29 DIAGNOSIS — M25611 Stiffness of right shoulder, not elsewhere classified: Secondary | ICD-10-CM

## 2016-01-29 DIAGNOSIS — R293 Abnormal posture: Secondary | ICD-10-CM

## 2016-01-29 DIAGNOSIS — M25511 Pain in right shoulder: Secondary | ICD-10-CM

## 2016-01-29 NOTE — Therapy (Signed)
[facility] [address] [place], [place], [address] Phone: [phone]   Fax:  [phone]  Physical Therapy Treatment  Patient Details  Name: Megan OrionRhonda Petersen MRN: [id number] Date of Birth: 04-Apr-1961 Referring Provider: Dr. Shelle IronBeane  Encounter Date: 01/29/2016      PT End of Session - 01/29/16 1106    Visit Number 12   Number of Visits 24   Date for PT Re-Evaluation 03/13/16   PT Start Time 1103   PT Stop Time 1203   PT Time Calculation (min) 60 min   Activity Tolerance Patient tolerated treatment well      Past Medical History  Diagnosis Date  . Allergy   . Hypertension   . Hyperlipidemia   . Asthma   . GERD (gastroesophageal reflux disease)   . Morbid obesity ([facility])   . Osteoarthritis   . Rosacea   . Trigeminal neuralgia   . LSIL (low grade squamous intraepithelial lesion) on Pap smear   . Atypical glandular cells on Pap smear   . Primary cough headache   . Osteoarthritis   . Esophageal reflux   . Impaired fasting glucose   . Swelling of limb   . Headache   . Pneumonia 2016  . URI (upper respiratory infection)     cough with green sputum since 11-26-15  . Family history of adverse reaction to anesthesia     brother slow to awaken    Past Surgical History  Procedure Laterality Date  . Cholecystectomy    . Leep    . Right knee meniscus repair  2014  . Shoulder open rotator cuff repair Right 12/01/2015    Procedure: RIGHT SHOULDER MINI OPEN ROTATOR CUFF REPAIR WITH RESECTON OF DISTAL CLAVICLE AND SUBACROMIAL DECOMPRESSION;  Surgeon: Jene EveryJeffrey Beane, MD;  Location: [facility];  Service: Orthopedics;  Laterality: Right;    There were no vitals filed for this visit.      Subjective Assessment - 01/29/16 1106    Subjective Shoulder has hurt more over the weekend. She remembers awakening once with her arm under her head - on her stomach, which is the position she normally sleeps in.    Currently in Pain? Yes   Pain Score 5    Pain Location Shoulder   Pain Orientation Right   Pain Descriptors / Indicators Aching   Pain Type Chronic pain   Pain Onset More than a month ago   Pain Frequency Intermittent   Aggravating Factors  moving the arm the wrong way   Pain Relieving Factors heating pad; TENS unit             [facility] PT Assessment - 01/29/16 0001    Assessment   Medical Diagnosis Rt RTC repair   Referring Provider Dr. Shelle IronBeane   Onset Date/Surgical Date 12/01/15   Hand Dominance Right   Next MD Visit 03/19/16   AROM   AROM Assessment Site Shoulder   Right/Left Shoulder Right   Right Shoulder Internal Rotation --  standing functional IR thumb to T8                     [facility] Adult PT Treatment/Exercise - 01/29/16 0001    Shoulder Exercises: Prone   Retraction Strengthening;Right  T's 3x10 reps, arm off EOB   Retraction Weight (lbs) 1#   Flexion Strengthening;Right;Weights  3x10   Flexion Weight (lbs) 1   Extension Strengthening;Right  3x10 arm off EOB 1#   Horizontal ABduction 1 Right;10  reps;Weights   Horizontal ABduction 1 Weight (lbs) 1#   Shoulder Exercises: Sidelying   External Rotation Strengthening;Right;Weights  3x10, towel under arm   External Rotation Weight (lbs) 1   ABduction Right;15 reps;Weights  to 90 deg    ABduction Weight (lbs) 1   Shoulder Exercises: Therapy Ball   Flexion 5 reps  stretch flexioin stepping under large ball    Shoulder Exercises: ROM/Strengthening   UBE (Upper Arm Bike) L1 x 4' alt FWD/BWD   Shoulder Exercises: Stretch   Internal Rotation Stretch 3 reps  20-30 sec with strap    Shoulder Exercises: Body Blade   Flexion 30 seconds;5 reps  Rt   Modalities   Modalities Electrical Stimulation;Vasopneumatic   Electrical Stimulation   Electrical Stimulation Location Rt shoulder   Electrical Stimulation Action IFC   Electrical Stimulation Parameters to tolerance   Electrical Stimulation Goals Pain   Ultrasound   Ultrasound  Location Rt proximal deltoid at area of palpable tightness    Ultrasound Parameters ; 1.5 w/cm2; 100%; 8 min    Vasopneumatic   Number Minutes Vasopneumatic  15 minutes   Vasopnuematic Location  Shoulder   Vasopneumatic Pressure Low   Vasopneumatic Temperature  3*   Manual Therapy   Soft tissue mobilization Rt deltoid                   PT Short Term Goals - 01/29/16 1315    PT SHORT TERM GOAL #4   Title improve FOTO =/< 50% limited ( 01/31/16)    Time 6   Period Weeks   Status On-going           PT Long Term Goals - 01/29/16 1315    PT LONG TERM GOAL #1   Title Independent with advanced HEP ( 03/13/16)   Time 12   Period Weeks   Status On-going   PT LONG TERM GOAL #2   Title demo Rt shoulder AROM WFL without pain to allow her to perform some quilting. ( 03/13/16)    Time 12   Period Weeks   Status On-going   PT LONG TERM GOAL #3   Title demo Rt shoulder strength =/> 5-/5 to allow return to work ( 03/13/16)    Time 12   Period Weeks   Status On-going   PT LONG TERM GOAL #4   Title report pain no greater than 2/10 with lifitng tasks ( 03/13/16)    Time 12   Period Weeks   Status On-going   PT LONG TERM GOAL #5   Title improve FOTO =/< 39% limited ( 03/13/16)    Time 12   Period Weeks   Status On-going               Plan - 01/29/16 1313    Clinical Impression Statement Some increased pain and muscle tightness through proximal deltoid today which responded well to [place] and manual work. Patient tolerated exercise without difficulty. She continued to demonstrate gains in ROM and reports improved functional activity level. Progressing well toward stated goals of treatment.    Rehab Potential Good   PT Frequency 2x / week   PT Duration 12 weeks   PT Treatment/Interventions Ultrasound;Patient/family education;Passive range of motion;Cryotherapy;Dry needling;Electrical Stimulation;Moist Heat;Therapeutic exercise;Manual techniques;Vasopneumatic Device;Taping    PT Next Visit Plan continue rehab per protocol    PT Home Exercise Plan HEP   Consulted and Agree with Plan of Care Patient      Patient will benefit from skilled  therapeutic intervention in order to improve the following deficits and impairments:  Postural dysfunction, Decreased strength, Increased edema, Decreased scar mobility, Pain, Impaired UE functional use, Decreased range of motion  Visit Diagnosis: Pain in right shoulder  Muscle weakness (generalized)  Abnormal posture  Stiffness of right shoulder, not elsewhere classified     Problem List Patient Active Problem List   Diagnosis Date Noted  . Complete rotator cuff tear 12/01/2015  . Wheezing 04/07/2014  . Impaired fasting glucose 07/26/2013  . Essential hypertension, benign 07/26/2013  . Back pain 07/26/2013  . Knee pain, bilateral 01/10/2013  . Morbid obesity ([facility]) 01/10/2013  . Headache(784.0) 12/06/2012    Lue Sykora Rober Minion PT, MPH  01/29/2016, 1:16 PM  [facility] [address] [place], [place], [address] Phone: [phone]   Fax:  [phone]  Name: Teresa Nicodemus MRN: [id number] Date of Birth: 03-02-1961

## 2016-02-01 ENCOUNTER — Ambulatory Visit (INDEPENDENT_AMBULATORY_CARE_PROVIDER_SITE_OTHER): Payer: BLUE CROSS/BLUE SHIELD | Admitting: Physical Therapy

## 2016-02-01 ENCOUNTER — Encounter: Payer: Self-pay | Admitting: Physical Therapy

## 2016-02-01 DIAGNOSIS — M25611 Stiffness of right shoulder, not elsewhere classified: Secondary | ICD-10-CM | POA: Diagnosis not present

## 2016-02-01 DIAGNOSIS — M25511 Pain in right shoulder: Secondary | ICD-10-CM

## 2016-02-01 DIAGNOSIS — R293 Abnormal posture: Secondary | ICD-10-CM | POA: Diagnosis not present

## 2016-02-01 DIAGNOSIS — M6281 Muscle weakness (generalized): Secondary | ICD-10-CM

## 2016-02-01 NOTE — Therapy (Signed)
[facility] [place] [address] [place], [place], [address] Phone: [phone]   Fax:  [phone]  Physical Therapy Treatment  Patient Details  Name: Megan Petersen MRN: [id number] Date of Birth: 1961-03-13 Referring Provider: Dr. Shelle Iron  Encounter Date: 02/01/2016      PT End of Session - 02/01/16 1108    Visit Number 13   Number of Visits 24   Date for PT Re-Evaluation 03/13/16   PT Start Time 1109   PT Stop Time 1201   PT Time Calculation (min) 52 min      Past Medical History  Diagnosis Date  . Allergy   . Hypertension   . Hyperlipidemia   . Asthma   . GERD (gastroesophageal reflux disease)   . Morbid obesity ([facility])   . Osteoarthritis   . Rosacea   . Trigeminal neuralgia   . LSIL (low grade squamous intraepithelial lesion) on Pap smear   . Atypical glandular cells on Pap smear   . Primary cough headache   . Osteoarthritis   . Esophageal reflux   . Impaired fasting glucose   . Swelling of limb   . Headache   . Pneumonia 2016  . URI (upper respiratory infection)     cough with green sputum since 11-26-15  . Family history of adverse reaction to anesthesia     brother slow to awaken    Past Surgical History  Procedure Laterality Date  . Cholecystectomy    . Leep    . Right knee meniscus repair  2014  . Shoulder open rotator cuff repair Right 12/01/2015    Procedure: RIGHT SHOULDER MINI OPEN ROTATOR CUFF REPAIR WITH RESECTON OF DISTAL CLAVICLE AND SUBACROMIAL DECOMPRESSION;  Surgeon: Jene Every, MD;  Location: [facility];  Service: Orthopedics;  Laterality: Right;    There were no vitals filed for this visit.      Subjective Assessment - 02/01/16 1110    Subjective Pt reports her shoulder is feeling better than it did earlier in the week.    Currently in Pain? Yes   Pain Score 3    Pain Location Shoulder   Pain Orientation Right   Pain Descriptors / Indicators Aching   Pain Type Chronic pain   Pain  Onset More than a month ago   Pain Frequency Intermittent                         [facility] Adult PT Treatment/Exercise - 02/01/16 0001    Shoulder Exercises: Supine   Other Supine Exercises 2x10, red band, overhead pull, ER, horizontal abduction   Shoulder Exercises: Standing   External Rotation Strengthening;Left;Theraband  3x10   Theraband Level (Shoulder External Rotation) Level 2 (Red)   Internal Rotation Strengthening;Right;Theraband  3x10   Theraband Level (Shoulder Internal Rotation) Level 2 (Red)   Flexion Strengthening;Right;Theraband  2x10 leaning on the wall   Shoulder Exercises: ROM/Strengthening   UBE (Upper Arm Bike) L2x4' alt FWD/BWD   Modalities   Modalities Research scientist (life sciences) Location Rt shoulder   Electrical Stimulation Action IFC   Electrical Stimulation Parameters  to tolerance   Electrical Stimulation Goals Pain   Ultrasound   Ultrasound Location Rt proximal deltiod   Ultrasound Parameters , 1.5w/cm2, 100%    Vasopneumatic   Number Minutes Vasopneumatic  15 minutes   Vasopnuematic Location  Shoulder   Vasopneumatic Pressure Low   Vasopneumatic Temperature  3*  PT Short Term Goals - 01/29/16 1315    PT SHORT TERM GOAL #4   Title improve FOTO =/< 50% limited ( 01/31/16)    Time 6   Period Weeks   Status On-going           PT Long Term Goals - 01/29/16 1315    PT LONG TERM GOAL #1   Title Independent with advanced HEP ( 03/13/16)   Time 12   Period Weeks   Status On-going   PT LONG TERM GOAL #2   Title demo Rt shoulder AROM WFL without pain to allow her to perform some quilting. ( 03/13/16)    Time 12   Period Weeks   Status On-going   PT LONG TERM GOAL #3   Title demo Rt shoulder strength =/> 5-/5 to allow return to work ( 03/13/16)    Time 12   Period Weeks   Status On-going   PT LONG TERM GOAL #4   Title report pain  no greater than 2/10 with lifitng tasks ( 03/13/16)    Time 12   Period Weeks   Status On-going   PT LONG TERM GOAL #5   Title improve FOTO =/< 39% limited ( 03/13/16)    Time 12   Period Weeks   Status On-going               Plan - 02/01/16 1129    Clinical Impression Statement Megan Petersen continues to improve. Shoulder ROM is good, strength slowly improving.  PRogressing to goals.    Rehab Potential Good   PT Frequency 2x / week   PT Duration 12 weeks   PT Treatment/Interventions Ultrasound;Patient/family education;Passive range of motion;Cryotherapy;Dry needling;Electrical Stimulation;Moist Heat;Therapeutic exercise;Manual techniques;Vasopneumatic Device;Taping   PT Next Visit Plan continue rehab per protocol    Consulted and Agree with Plan of Care Patient      Patient will benefit from skilled therapeutic intervention in order to improve the following deficits and impairments:  Postural dysfunction, Decreased strength, Increased edema, Decreased scar mobility, Pain, Impaired UE functional use, Decreased range of motion  Visit Diagnosis: Pain in right shoulder  Muscle weakness (generalized)  Abnormal posture  Stiffness of right shoulder, not elsewhere classified     Problem List Patient Active Problem List   Diagnosis Date Noted  . Complete rotator cuff tear 12/01/2015  . Wheezing 04/07/2014  . Impaired fasting glucose 07/26/2013  . Essential hypertension, benign 07/26/2013  . Back pain 07/26/2013  . Knee pain, bilateral 01/10/2013  . Morbid obesity (H[facility]) 01/10/2013  . Headache(784.0) 12/06/2012    Roderic ScarceSusan Shaver PT 02/01/2016, 11:47 AM  [facility] [address] [place], [place], [address] Phone: [phone]   Fax:  [phone]  Name: Megan Petersen MRN: [id number] Date of Birth: 1960-09-05

## 2016-02-05 ENCOUNTER — Ambulatory Visit (INDEPENDENT_AMBULATORY_CARE_PROVIDER_SITE_OTHER): Payer: BLUE CROSS/BLUE SHIELD | Admitting: Rehabilitative and Restorative Service Providers"

## 2016-02-05 ENCOUNTER — Encounter: Payer: Self-pay | Admitting: Rehabilitative and Restorative Service Providers"

## 2016-02-05 DIAGNOSIS — M6281 Muscle weakness (generalized): Secondary | ICD-10-CM

## 2016-02-05 DIAGNOSIS — R293 Abnormal posture: Secondary | ICD-10-CM | POA: Diagnosis not present

## 2016-02-05 DIAGNOSIS — M25511 Pain in right shoulder: Secondary | ICD-10-CM | POA: Diagnosis not present

## 2016-02-05 DIAGNOSIS — M25611 Stiffness of right shoulder, not elsewhere classified: Secondary | ICD-10-CM

## 2016-02-05 NOTE — Therapy (Signed)
[facility] [place] [address] [place] [address] [place], [place], [address] Phone: [phone]   Fax:  [phone]  Physical Therapy Treatment  Patient Details  Name: Megan Petersen MRN: [id number] Date of Birth: 05/12/1961 Referring Provider: Dr. Shelle Iron   Encounter Date: 02/05/2016      PT End of Session - 02/05/16 1159    Visit Number 14   Number of Visits 24   Date for PT Re-Evaluation 03/13/16   PT Start Time 1154   PT Stop Time 1247   PT Time Calculation (min) 53 min   Activity Tolerance Patient tolerated treatment well      Past Medical History  Diagnosis Date  . Allergy   . Hypertension   . Hyperlipidemia   . Asthma   . GERD (gastroesophageal reflux disease)   . Morbid obesity (HCC)   . Osteoarthritis   . Rosacea   . Trigeminal neuralgia   . LSIL (low grade squamous intraepithelial lesion) on Pap smear   . Atypical glandular cells on Pap smear   . Primary cough headache   . Osteoarthritis   . Esophageal reflux   . Impaired fasting glucose   . Swelling of limb   . Headache   . Pneumonia 2016  . URI (upper respiratory infection)     cough with green sputum since 11-26-15  . Family history of adverse reaction to anesthesia     brother slow to awaken    Past Surgical History  Procedure Laterality Date  . Cholecystectomy    . Leep    . Right knee meniscus repair  2014  . Shoulder open rotator cuff repair Right 12/01/2015    Procedure: RIGHT SHOULDER MINI OPEN ROTATOR CUFF REPAIR WITH RESECTON OF DISTAL CLAVICLE AND SUBACROMIAL DECOMPRESSION;  Surgeon: Jene Every, MD;  Location: [facility];  Service: Orthopedics;  Laterality: Right;    There were no vitals filed for this visit.      Subjective Assessment - 02/05/16 1200    Subjective Patient reports that her shoudler is still tender in the outside of the shoudler area. The [place] really helped. Does not want to do the dry needling. Some pain continues on and intermittent  basis.    Currently in Pain? Yes   Pain Score 2    Pain Location Shoulder   Pain Orientation Right   Pain Descriptors / Indicators Aching            [facility] PT Assessment - 02/05/16 0001    Assessment   Medical Diagnosis Rt RTC repair   Referring Provider Dr. Shelle Iron    Onset Date/Surgical Date 12/01/15   Hand Dominance Right   Next MD Visit 03/19/16   AROM   AROM Assessment Site Shoulder   Right/Left Shoulder Right   Right Shoulder Flexion 156 Degrees   Right Shoulder ABduction 164 Degrees   Right Shoulder Internal Rotation --  standing functional IR thumb to T8   Right Shoulder External Rotation 72 Degrees   PROM   Right/Left Shoulder --  assessed w/pt in supine   Right Shoulder Flexion 165 Degrees   Right Shoulder ABduction 168 Degrees   Right Shoulder Internal Rotation 77 Degrees  PT preventing scapular rotation    Right Shoulder External Rotation 82 Degrees                     [facility] Adult PT Treatment/Exercise - 02/05/16 0001    Shoulder Exercises: Supine   Other Supine Exercises 2x10, green  band, overhead pull, ER, horizontal abduction   Shoulder Exercises: Standing   External Rotation Strengthening;Left;Theraband  3x10   Theraband Level (Shoulder External Rotation) Level 3 (Green)   Internal Rotation Strengthening;Right;Theraband  3x10   Theraband Level (Shoulder Internal Rotation) Level 3 (Green)   Extension Strengthening;Both;20 reps;Theraband   Theraband Level (Shoulder Extension) Level 3 (Green)   Row Strengthening;Both;20 reps;Theraband   Theraband Level (Shoulder Row) Level 3 (Green)   Shoulder Exercises: ROM/Strengthening   UBE (Upper Arm Bike) L2x4' alt FWD/BWD   Shoulder Exercises: Body Blade   Flexion 30 seconds;3 reps   Modalities   Modalities Electrical Stimulation;Ultrasound;Vasopneumatic   Programme researcher, broadcasting/film/videolectrical Stimulation   Electrical Stimulation Location Rt shoulder   Electrical Stimulation Action IFC   Electrical Stimulation Parameters  to tolerance   Electrical Stimulation Goals Pain   Ultrasound   Ultrasound Location Rt proximal deltoid   Ultrasound Parameters 1 mHz; 1.5 w/cm2; 100%; 8 min    Ultrasound Goals Other (Comment)  muscular tightness    Vasopneumatic   Number Minutes Vasopneumatic  15 minutes   Vasopnuematic Location  Shoulder   Vasopneumatic Pressure Low   Vasopneumatic Temperature  3*   Manual Therapy   Soft tissue mobilization Rt deltoid                   PT Short Term Goals - 01/29/16 1315    PT SHORT TERM GOAL #4   Title improve FOTO =/< 50% limited ( 01/31/16)    Time 6   Period Weeks   Status On-going           PT Long Term Goals - 02/05/16 1159    PT LONG TERM GOAL #1   Title Independent with advanced HEP ( 03/13/16)   Time 12   Period Weeks   Status On-going   PT LONG TERM GOAL #2   Title demo Rt shoulder AROM WFL without pain to allow her to perform some quilting. ( 03/13/16)    Time 12   Period Weeks   Status On-going   PT LONG TERM GOAL #3   Title demo Rt shoulder strength =/> 5-/5 to allow return to work ( 03/13/16)    Time 12   Period Weeks   Status On-going   PT LONG TERM GOAL #4   Title report pain no greater than 2/10 with lifitng tasks ( 03/13/16)    Time 12   Period Weeks   Status On-going   PT LONG TERM GOAL #5   Title improve FOTO =/< 39% limited ( 03/13/16)    Time 12   Period Weeks   Status On-going               Plan - 02/05/16 1239    Clinical Impression Statement Continued palpable tightness middle deltoid. Megan LoserRhonda c/o of some pain in the shoulder with certain movements or positions. She is gaining ROM and progressing with strengthening exercises. Progressing well toward stated goals of therapy.    Rehab Potential Good   PT Frequency 2x / week   PT Duration 12 weeks   PT Treatment/Interventions Ultrasound;Patient/family education;Passive range of motion;Cryotherapy;Dry needling;Electrical Stimulation;Moist Heat;Therapeutic exercise;Manual  techniques;Vasopneumatic Device;Taping   PT Next Visit Plan continue rehab per protocol    PT Home Exercise Plan HEP   Consulted and Agree with Plan of Care Patient      Patient will benefit from skilled therapeutic intervention in order to improve the following deficits and impairments:  Postural dysfunction, Decreased strength, Increased edema, Decreased  scar mobility, Pain, Impaired UE functional use, Decreased range of motion  Visit Diagnosis: Pain in right shoulder  Muscle weakness (generalized)  Abnormal posture  Stiffness of right shoulder, not elsewhere classified     Problem List Patient Active Problem List   Diagnosis Date Noted  . Complete rotator cuff tear 12/01/2015  . Wheezing 04/07/2014  . Impaired fasting glucose 07/26/2013  . Essential hypertension, benign 07/26/2013  . Back pain 07/26/2013  . Knee pain, bilateral 01/10/2013  . Morbid obesity (HCC) 01/10/2013  . Headache(784.0) 12/06/2012    Megan Petersen Rober Minion PT, MPH  02/05/2016, 12:42 PM  [facility] [address] [place], [place], [address] Phone: [phone]   Fax:  [phone]  Name: Megan Petersen MRN: [id number] Date of Birth: 01-18-1961

## 2016-02-08 ENCOUNTER — Ambulatory Visit (INDEPENDENT_AMBULATORY_CARE_PROVIDER_SITE_OTHER): Payer: BLUE CROSS/BLUE SHIELD | Admitting: Physical Therapy

## 2016-02-08 DIAGNOSIS — M6281 Muscle weakness (generalized): Secondary | ICD-10-CM

## 2016-02-08 DIAGNOSIS — M25511 Pain in right shoulder: Secondary | ICD-10-CM

## 2016-02-08 DIAGNOSIS — M25611 Stiffness of right shoulder, not elsewhere classified: Secondary | ICD-10-CM | POA: Diagnosis not present

## 2016-02-08 DIAGNOSIS — R293 Abnormal posture: Secondary | ICD-10-CM | POA: Diagnosis not present

## 2016-02-08 NOTE — Therapy (Signed)
[facility] [address] [place] [address] [place], [place], [address] Phone: [phone]   Fax:  [phone]  Physical Therapy Treatment  Patient Details  Name: Megan Petersen MRN: [id number] Date of Birth: 1960/12/27 Referring Provider: Dr. Shelle IronBeane   Encounter Date: 02/08/2016      PT End of Session - 02/08/16 1210    Visit Number 15   Number of Visits 24   Date for PT Re-Evaluation 03/13/16   PT Start Time 1150   PT Stop Time 1257   PT Time Calculation (min) 67 min   Activity Tolerance Patient tolerated treatment well      Past Medical History  Diagnosis Date  . Allergy   . Hypertension   . Hyperlipidemia   . Asthma   . GERD (gastroesophageal reflux disease)   . Morbid obesity (HCC)   . Osteoarthritis   . Rosacea   . Trigeminal neuralgia   . LSIL (low grade squamous intraepithelial lesion) on Pap smear   . Atypical glandular cells on Pap smear   . Primary cough headache   . Osteoarthritis   . Esophageal reflux   . Impaired fasting glucose   . Swelling of limb   . Headache   . Pneumonia 2016  . URI (upper respiratory infection)     cough with green sputum since 11-26-15  . Family history of adverse reaction to anesthesia     brother slow to awaken    Past Surgical History  Procedure Laterality Date  . Cholecystectomy    . Leep    . Right knee meniscus repair  2014  . Shoulder open rotator cuff repair Right 12/01/2015    Procedure: RIGHT SHOULDER MINI OPEN ROTATOR CUFF REPAIR WITH RESECTON OF DISTAL CLAVICLE AND SUBACROMIAL DECOMPRESSION;  Surgeon: Jene EveryJeffrey Beane, MD;  Location: [facility];  Service: Orthopedics;  Laterality: Right;    There were no vitals filed for this visit.      Subjective Assessment - 02/08/16 1210    Subjective Pt reports no new changes since last visit. Fell off stool this morning, no change in pain level   Currently in Pain? Yes   Pain Score 3    Pain Location Shoulder   Pain Orientation  Right;Lateral   Pain Descriptors / Indicators Aching   Pain Radiating Towards having pain in her Lt lower back after last visit.    Aggravating Factors  moving arm wrong way   Pain Relieving Factors heating pad, TENS unit.                          [facility] Adult PT Treatment/Exercise - 02/08/16 0001    Shoulder Exercises: Supine   Flexion Strengthening;Right;Weights  3x10   Shoulder Flexion Weight (lbs) 2   Other Supine Exercises on whole bolster, 3x10reps OH, horizontal abduct/ER with green band. SASH with yellow band   Shoulder Exercises: Sidelying   External Rotation Strengthening;Right;10 reps;Weights  1 set with 3#,2 sets with 2#    ABduction Strengthening;Right;10 reps;Weights  3 sets   ABduction Weight (lbs) 2   Shoulder Exercises: ROM/Strengthening   UBE (Upper Arm Bike) L2x6' alt FWD/BWD   Modalities   Modalities Electrical Stimulation;Iontophoresis;Vasopneumatic   Scientist, research (medical)lectrical Stimulation   Electrical Stimulation Location Rt shoulder   Electrical Stimulation Action premod, CH1 to Rt shoulder, Ch2 to Lt side back   Electrical Stimulation Parameters to tolerance   Electrical Stimulation Goals Pain   Iontophoresis   Type of Iontophoresis Dexamethasone  Location Rt shoulder acromion process   Dose 1.0 cc   Time 6 hr stat patch   Vasopneumatic   Number Minutes Vasopneumatic  15 minutes   Vasopnuematic Location  Shoulder   Vasopneumatic Pressure Low   Vasopneumatic Temperature  3*                  PT Short Term Goals - 01/29/16 1315    PT SHORT TERM GOAL #4   Title improve FOTO =/< 50% limited ( 01/31/16)    Time 6   Period Weeks   Status On-going           PT Long Term Goals - 02/05/16 1159    PT LONG TERM GOAL #1   Title Independent with advanced HEP ( 03/13/16)   Time 12   Period Weeks   Status On-going   PT LONG TERM GOAL #2   Title demo Rt shoulder AROM WFL without pain to allow her to perform some quilting. ( 03/13/16)    Time  12   Period Weeks   Status On-going   PT LONG TERM GOAL #3   Title demo Rt shoulder strength =/> 5-/5 to allow return to work ( 03/13/16)    Time 12   Period Weeks   Status On-going   PT LONG TERM GOAL #4   Title report pain no greater than 2/10 with lifitng tasks ( 03/13/16)    Time 12   Period Weeks   Status On-going   PT LONG TERM GOAL #5   Title improve FOTO =/< 39% limited ( 03/13/16)    Time 12   Period Weeks   Status On-going               Plan - 02/08/16 1234    Clinical Impression Statement Megan Petersen continues to improve, her strength is improving.  She is having some consistent pain just distal to the acromion process.    Rehab Potential Good   PT Frequency 2x / week   PT Duration 12 weeks   PT Treatment/Interventions Ultrasound;Patient/family education;Passive range of motion;Cryotherapy;Dry needling;Electrical Stimulation;Moist Heat;Therapeutic exercise;Manual techniques;Vasopneumatic Device;Taping;Iontophoresis /ml Dexamethasone   PT Next Visit Plan see how ionto to Rt acromion was   Consulted and Agree with Plan of Care Patient      Patient will benefit from skilled therapeutic intervention in order to improve the following deficits and impairments:  Postural dysfunction, Decreased strength, Increased edema, Decreased scar mobility, Pain, Impaired UE functional use, Decreased range of motion  Visit Diagnosis: Pain in right shoulder  Abnormal posture  Stiffness of right shoulder, not elsewhere classified  Muscle weakness (generalized)     Problem List Patient Active Problem List   Diagnosis Date Noted  . Complete rotator cuff tear 12/01/2015  . Wheezing 04/07/2014  . Impaired fasting glucose 07/26/2013  . Essential hypertension, benign 07/26/2013  . Back pain 07/26/2013  . Knee pain, bilateral 01/10/2013  . Morbid obesity (HCC) 01/10/2013  . Headache(784.0) 12/06/2012    Roderic Scarce PT  02/08/2016, 12:47 PM  [facility] [address] [place] [address] [place], [place], [address] Phone: [phone]   Fax:  [phone]  Name: Megan Petersen MRN: [id number] Date of Birth: 1961-07-08

## 2016-02-12 ENCOUNTER — Encounter: Payer: Self-pay | Admitting: Rehabilitative and Restorative Service Providers"

## 2016-02-12 ENCOUNTER — Ambulatory Visit (INDEPENDENT_AMBULATORY_CARE_PROVIDER_SITE_OTHER): Payer: BLUE CROSS/BLUE SHIELD | Admitting: Rehabilitative and Restorative Service Providers"

## 2016-02-12 DIAGNOSIS — R293 Abnormal posture: Secondary | ICD-10-CM

## 2016-02-12 DIAGNOSIS — M25611 Stiffness of right shoulder, not elsewhere classified: Secondary | ICD-10-CM | POA: Diagnosis not present

## 2016-02-12 DIAGNOSIS — M25511 Pain in right shoulder: Secondary | ICD-10-CM

## 2016-02-12 DIAGNOSIS — M6281 Muscle weakness (generalized): Secondary | ICD-10-CM

## 2016-02-12 NOTE — Patient Instructions (Signed)
Scapula Adduction With Pectoralis Stretch: Low - Standing   Shoulders at 45 hands even with shoulders, keeping weight through legs, shift weight forward until you feel pull or stretch through the front of your chest. Hold _30__ seconds. Do _3__ times, _2-4__ times per day.   Scapula Adduction With Pectoralis Stretch: Mid-Range - Standing   Shoulders at 90 elbows even with shoulders, keeping weight through legs, shift weight forward until you feel pull or strength through the front of your chest. Hold __30_ seconds. Do _3__ times, __2-4_ times per day.   Scapula Adduction With Pectoralis Stretch: High - Standing   Shoulders at 120 hands up high on the doorway, keeping weight on feet, shift weight forward until you feel pull or stretch through the front of your chest. Hold _30__ seconds. Do _3__ times, _2-3__ times per day.  

## 2016-02-12 NOTE — Therapy (Signed)
[facility] [address] [place] [address] [place], [place], [address] Phone: [phone]   Fax:  [phone]  Physical Therapy Treatment  Patient Details  Name: Selinda OrionRhonda Faulds MRN: [id number] Date of Birth: 1960/11/19 Referring Provider: Dr. Shelle IronBeane  Encounter Date: 02/12/2016      PT End of Session - 02/12/16 1206    Visit Number 16   Number of Visits 24   Date for PT Re-Evaluation 03/13/16   PT Start Time 1148   PT Stop Time 1254   PT Time Calculation (min) 66 min   Activity Tolerance Patient tolerated treatment well      Past Medical History  Diagnosis Date  . Allergy   . Hypertension   . Hyperlipidemia   . Asthma   . GERD (gastroesophageal reflux disease)   . Morbid obesity (HCC)   . Osteoarthritis   . Rosacea   . Trigeminal neuralgia   . LSIL (low grade squamous intraepithelial lesion) on Pap smear   . Atypical glandular cells on Pap smear   . Primary cough headache   . Osteoarthritis   . Esophageal reflux   . Impaired fasting glucose   . Swelling of limb   . Headache   . Pneumonia 2016  . URI (upper respiratory infection)     cough with green sputum since 11-26-15  . Family history of adverse reaction to anesthesia     brother slow to awaken    Past Surgical History  Procedure Laterality Date  . Cholecystectomy    . Leep    . Right knee meniscus repair  2014  . Shoulder open rotator cuff repair Right 12/01/2015    Procedure: RIGHT SHOULDER MINI OPEN ROTATOR CUFF REPAIR WITH RESECTON OF DISTAL CLAVICLE AND SUBACROMIAL DECOMPRESSION;  Surgeon: Jene EveryJeffrey Beane, MD;  Location: [facility];  Service: Orthopedics;  Laterality: Right;    There were no vitals filed for this visit.      Subjective Assessment - 02/12/16 1203    Subjective Shoulder does not hurt but her back has been hurting since yesterday. She is using a pain patch for back.    Currently in Pain? No/denies  none in shoulder             [facility] PT  Assessment - 02/12/16 0001    Assessment   Medical Diagnosis Rt RTC repair   Referring Provider Dr. Shelle IronBeane   Onset Date/Surgical Date 12/01/15   Hand Dominance Right   Next MD Visit 03/19/16   AROM   Right Shoulder Flexion 157 Degrees   Right Shoulder ABduction 167 Degrees   Right Shoulder Internal Rotation --  standing functional IR thumb to T8   Right Shoulder External Rotation 72 Degrees                     [facility] Adult PT Treatment/Exercise - 02/12/16 0001    Shoulder Exercises: Supine   Other Supine Exercises on half bolster, 3x10reps OH, horizontal abduct/ER with green band. SASH with yellow band   Shoulder Exercises: Sidelying   External Rotation Strengthening;Right;10 reps;Weights  1 set with 3#,2 sets with 2#    ABduction Strengthening;Right;10 reps;Weights  3 sets   ABduction Weight (lbs) 2   Shoulder Exercises: Standing   External Rotation Strengthening;Left;Theraband   Theraband Level (Shoulder External Rotation) Level 3 (Green)   Internal Rotation Strengthening;Right;Theraband   Theraband Level (Shoulder Internal Rotation) Level 3 (Green)   Flexion Strengthening;Right;Theraband   Extension Strengthening;Both;20 reps;Theraband   Theraband Level (  Shoulder Extension) Level 3 (Green)   Row Strengthening;Both;20 reps;Theraband   Theraband Level (Shoulder Row) Level 3 (Green)   Other Standing Exercises rings both directions x 2    Other Standing Exercises wall push ups x 20    Shoulder Exercises: ROM/Strengthening   UBE (Upper Arm Bike) L2x6' alt FWD/BWD   Shoulder Exercises: Stretch   Other Shoulder Stretches 3 way doorway stretch 30 sec x 2 each position    Shoulder Exercises: Body Blade   Flexion 30 seconds;4 reps   Modalities   Modalities Electrical Stimulation;Iontophoresis;Vasopneumatic   Scientist, physiological IFC   Electrical Stimulation Parameters to tolerance    Electrical Stimulation Goals Pain   Iontophoresis   Type of Iontophoresis Dexamethasone   Location Rt shoulder acromion process   Dose 1.0 cc   Time 6 hr stat patch   Vasopneumatic   Number Minutes Vasopneumatic  15 minutes   Vasopnuematic Location  Shoulder   Vasopneumatic Pressure Low   Vasopneumatic Temperature  3*                PT Education - 02/12/16 1225    Education provided Yes   Education Details HEP    Person(s) Educated Patient   Methods Explanation;Demonstration;Tactile cues;Verbal cues;Handout   Comprehension Verbalized understanding;Returned demonstration;Verbal cues required;Tactile cues required          PT Short Term Goals - 01/29/16 1315    PT SHORT TERM GOAL #4   Title improve FOTO =/< 50% limited ( 01/31/16)    Time 6   Period Weeks   Status On-going           PT Long Term Goals - 02/12/16 1240    PT LONG TERM GOAL #1   Title Independent with advanced HEP ( 03/13/16)   Time 12   Period Weeks   Status On-going   PT LONG TERM GOAL #2   Title demo Rt shoulder AROM WFL without pain to allow her to perform some quilting. ( 03/13/16)    Time 12   Period Weeks   Status On-going   PT LONG TERM GOAL #3   Title demo Rt shoulder strength =/> 5-/5 to allow return to work ( 03/13/16)    Time 12   Period Weeks   Status On-going   PT LONG TERM GOAL #4   Title report pain no greater than 2/10 with lifitng tasks ( 03/13/16)    Time 12   Period Weeks   Status On-going   PT LONG TERM GOAL #5   Title improve FOTO =/< 39% limited ( 03/13/16)    Time 12   Period Weeks   Status On-going             Patient will benefit from skilled therapeutic intervention in order to improve the following deficits and impairments:     Visit Diagnosis: Pain in right shoulder  Abnormal posture  Stiffness of right shoulder, not elsewhere classified  Muscle weakness (generalized)     Problem List Patient Active Problem List   Diagnosis Date Noted   . Complete rotator cuff tear 12/01/2015  . Wheezing 04/07/2014  . Impaired fasting glucose 07/26/2013  . Essential hypertension, benign 07/26/2013  . Back pain 07/26/2013  . Knee pain, bilateral 01/10/2013  . Morbid obesity (HCC) 01/10/2013  . Headache(784.0) 12/06/2012    Donal Lynam Rober Minion PT, MPH  02/12/2016, 12:45 PM  [facility] [phone]  [place] [address] [place], [place], [address] Phone: [phone]   Fax:  [phone]  Name: Shaasia Odle MRN: [id number] Date of Birth: [birth year]

## 2016-02-15 ENCOUNTER — Encounter: Payer: Self-pay | Admitting: Physical Therapy

## 2016-02-15 ENCOUNTER — Ambulatory Visit (INDEPENDENT_AMBULATORY_CARE_PROVIDER_SITE_OTHER): Payer: BLUE CROSS/BLUE SHIELD | Admitting: Physical Therapy

## 2016-02-15 DIAGNOSIS — M25611 Stiffness of right shoulder, not elsewhere classified: Secondary | ICD-10-CM

## 2016-02-15 DIAGNOSIS — M6281 Muscle weakness (generalized): Secondary | ICD-10-CM

## 2016-02-15 DIAGNOSIS — R293 Abnormal posture: Secondary | ICD-10-CM

## 2016-02-15 DIAGNOSIS — M25511 Pain in right shoulder: Secondary | ICD-10-CM

## 2016-02-15 NOTE — Therapy (Signed)
[facility] [address] [place], [place], [address] Phone: [phone]   Fax:  [phone]  Physical Therapy Treatment  Patient Details  Name: Megan Petersen MRN: [id number] Date of Birth: 12/03/1960 Referring Provider: Dr. Shelle IronBeane  Encounter Date: 02/15/2016      PT End of Session - 02/15/16 1148    Visit Number 17   Number of Visits 24   Date for PT Re-Evaluation 03/13/16   PT Start Time 1147   PT Stop Time 1253   PT Time Calculation (min) 66 min   Activity Tolerance Patient tolerated treatment well      Past Medical History  Diagnosis Date  . Allergy   . Hypertension   . Hyperlipidemia   . Asthma   . GERD (gastroesophageal reflux disease)   . Morbid obesity (HCC)   . Osteoarthritis   . Rosacea   . Trigeminal neuralgia   . LSIL (low grade squamous intraepithelial lesion) on Pap smear   . Atypical glandular cells on Pap smear   . Primary cough headache   . Osteoarthritis   . Esophageal reflux   . Impaired fasting glucose   . Swelling of limb   . Headache   . Pneumonia 2016  . URI (upper respiratory infection)     cough with green sputum since 11-26-15  . Family history of adverse reaction to anesthesia     brother slow to awaken    Past Surgical History  Procedure Laterality Date  . Cholecystectomy    . Leep    . Right knee meniscus repair  2014  . Shoulder open rotator cuff repair Right 12/01/2015    Procedure: RIGHT SHOULDER MINI OPEN ROTATOR CUFF REPAIR WITH RESECTON OF DISTAL CLAVICLE AND SUBACROMIAL DECOMPRESSION;  Surgeon: Jene EveryJeffrey Beane, MD;  Location: [facility];  Service: Orthopedics;  Laterality: Right;    There were no vitals filed for this visit.      Subjective Assessment - 02/15/16 1149    Subjective She is doing well, has slight pain just distal to the Rt acromion process.    Currently in Pain? Yes   Pain Score 2    Pain Location Shoulder   Pain Orientation Right   Pain Descriptors /  Indicators Aching   Pain Type Surgical pain   Pain Onset More than a month ago   Pain Frequency Intermittent   Pain Relieving Factors TENS, heat                         [facility] Adult PT Treatment/Exercise - 02/15/16 0001    Shoulder Exercises: Supine   Other Supine Exercises dribbling ball overhead 3x30sec   Shoulder Exercises: Seated   Other Seated Exercises 10 reps each, empty can to 90degrees, bilat, single. & altnerating   Shoulder Exercises: Standing   External Rotation Strengthening;Both;Theraband  3x10   Theraband Level (Shoulder External Rotation) Level 3 (Green)   Flexion Strengthening;Both;Weights  3x8 leaning on noodle   Shoulder Flexion Weight (lbs) 2   Other Standing Exercises 3x10 biceps/triceps with green band   Shoulder Exercises: ROM/Strengthening   UBE (Upper Arm Bike) L2x6' alt FWD/BWD   Wall Pushups 20 reps   Plank 5 reps  on wall, walking hands up/down   Other ROM/Strengthening Exercises lat pull down 3x10,  2plates   Shoulder Exercises: Stretch   Other Shoulder Stretches 3 way doorway stretch 30 sec x 2 each position    Modalities   Modalities Electrical  Stimulation;Iontophoresis;Vasopneumatic   Scientist, physiological IFC   Electrical Stimulation Parameters to tolerance   Electrical Stimulation Goals Pain   Iontophoresis   Type of Iontophoresis Dexamethasone   Location Rt shoulder acromion process   Dose 1.0 cc   Time 6 hr stat patch   Vasopneumatic   Number Minutes Vasopneumatic  15 minutes   Vasopnuematic Location  Shoulder   Vasopneumatic Pressure Low   Vasopneumatic Temperature  3*                  PT Short Term Goals - 01/29/16 1315    PT SHORT TERM GOAL #4   Title improve FOTO =/< 50% limited ( 01/31/16)    Time 6   Period Weeks   Status On-going           PT Long Term Goals - 02/12/16 1240    PT LONG TERM GOAL #1   Title  Independent with advanced HEP ( 03/13/16)   Time 12   Period Weeks   Status On-going   PT LONG TERM GOAL #2   Title demo Rt shoulder AROM WFL without pain to allow her to perform some quilting. ( 03/13/16)    Time 12   Period Weeks   Status On-going   PT LONG TERM GOAL #3   Title demo Rt shoulder strength =/> 5-/5 to allow return to work ( 03/13/16)    Time 12   Period Weeks   Status On-going   PT LONG TERM GOAL #4   Title report pain no greater than 2/10 with lifitng tasks ( 03/13/16)    Time 12   Period Weeks   Status On-going   PT LONG TERM GOAL #5   Title improve FOTO =/< 39% limited ( 03/13/16)    Time 12   Period Weeks   Status On-going               Plan - 02/15/16 1220    Clinical Impression Statement Megan Petersen's strength is slowly improving, she does fatigue with resistance. Pain continues around the acromion process, today is the third ionto, she should start to have some relief.    Rehab Potential Good   PT Frequency 2x / week   PT Duration 12 weeks   PT Treatment/Interventions Ultrasound;Patient/family education;Passive range of motion;Cryotherapy;Dry needling;Electrical Stimulation;Moist Heat;Therapeutic exercise;Manual techniques;Vasopneumatic Device;Taping;Iontophoresis /ml Dexamethasone   PT Next Visit Plan see how ionto to Rt acromion was   Consulted and Agree with Plan of Care Patient      Patient will benefit from skilled therapeutic intervention in order to improve the following deficits and impairments:  Postural dysfunction, Decreased strength, Increased edema, Decreased scar mobility, Pain, Impaired UE functional use, Decreased range of motion  Visit Diagnosis: Pain in right shoulder  Abnormal posture  Stiffness of right shoulder, not elsewhere classified  Muscle weakness (generalized)     Problem List Patient Active Problem List   Diagnosis Date Noted  . Complete rotator cuff tear 12/01/2015  . Wheezing 04/07/2014  . Impaired fasting  glucose 07/26/2013  . Essential hypertension, benign 07/26/2013  . Back pain 07/26/2013  . Knee pain, bilateral 01/10/2013  . Morbid obesity (HCC) 01/10/2013  . Headache(784.0) 12/06/2012    Roderic Scarce PT  02/15/2016, 12:39 PM  [facility] [address] [place], [place], [address] Phone: [phone]   Fax:  [phone]  Name: Megan Petersen MRN: [id number] Date  of Birth: Jun 08, 1961

## 2016-02-19 ENCOUNTER — Encounter: Payer: Self-pay | Admitting: Rehabilitative and Restorative Service Providers"

## 2016-02-19 ENCOUNTER — Ambulatory Visit (INDEPENDENT_AMBULATORY_CARE_PROVIDER_SITE_OTHER): Payer: BLUE CROSS/BLUE SHIELD | Admitting: Rehabilitative and Restorative Service Providers"

## 2016-02-19 DIAGNOSIS — M25611 Stiffness of right shoulder, not elsewhere classified: Secondary | ICD-10-CM

## 2016-02-19 DIAGNOSIS — R293 Abnormal posture: Secondary | ICD-10-CM

## 2016-02-19 DIAGNOSIS — M25511 Pain in right shoulder: Secondary | ICD-10-CM | POA: Diagnosis not present

## 2016-02-19 DIAGNOSIS — M6281 Muscle weakness (generalized): Secondary | ICD-10-CM

## 2016-02-19 NOTE — Therapy (Signed)
[facility] [address] [place], [place], [address] Phone: [phone]   Fax:  [phone]  Physical Therapy Treatment  Patient Details  Name: Megan Petersen MRN: [id number] Date of Birth: Jan 18, 1961 Referring Provider: Dr. Shelle Iron  Encounter Date: 02/19/2016      PT End of Session - 02/19/16 1113    Visit Number 18   Number of Visits 24   Date for PT Re-Evaluation 03/13/16   PT Start Time 1103   PT Stop Time 1200   PT Time Calculation (min) 57 min   Activity Tolerance Patient tolerated treatment well      Past Medical History  Diagnosis Date  . Allergy   . Hypertension   . Hyperlipidemia   . Asthma   . GERD (gastroesophageal reflux disease)   . Morbid obesity (HCC)   . Osteoarthritis   . Rosacea   . Trigeminal neuralgia   . LSIL (low grade squamous intraepithelial lesion) on Pap smear   . Atypical glandular cells on Pap smear   . Primary cough headache   . Osteoarthritis   . Esophageal reflux   . Impaired fasting glucose   . Swelling of limb   . Headache   . Pneumonia 2016  . URI (upper respiratory infection)     cough with green sputum since 11-26-15  . Family history of adverse reaction to anesthesia     brother slow to awaken    Past Surgical History  Procedure Laterality Date  . Cholecystectomy    . Leep    . Right knee meniscus repair  2014  . Shoulder open rotator cuff repair Right 12/01/2015    Procedure: RIGHT SHOULDER MINI OPEN ROTATOR CUFF REPAIR WITH RESECTON OF DISTAL CLAVICLE AND SUBACROMIAL DECOMPRESSION;  Surgeon: Jene Every, MD;  Location: [facility];  Service: Orthopedics;  Laterality: Right;    There were no vitals filed for this visit.      Subjective Assessment - 02/19/16 1114    Subjective Doing well. Shoulder is not sore today.    Currently in Pain? No/denies            [facility] PT Assessment - 02/19/16 0001    Assessment   Medical Diagnosis Rt RTC repair   Referring  Provider Dr. Shelle Iron   Onset Date/Surgical Date 12/01/15   Hand Dominance Right   Next MD Visit 03/19/16   AROM   Right Shoulder Flexion 157 Degrees   Right Shoulder ABduction 167 Degrees   Right Shoulder Internal Rotation --  standing functional IR thumb to T8   Right Shoulder External Rotation 70 Degrees                     [facility] Adult PT Treatment/Exercise - 02/19/16 0001    Shoulder Exercises: Standing   Flexion Strengthening;Both;Weights  3x8 leaning on noodle   Shoulder Flexion Weight (lbs) 3   ABduction Strengthening;Both  3x10 with noodle    Shoulder ABduction Weight (lbs) 3   Row Strengthening;Right;Weights  3x10 bent forward    Row Weight (lbs) 3   Other Standing Exercises box lift 20 # waist to 18 inch 2 x 5 reps; standing at shelf 34 in high manipulating wts 2.5 and 5 # for 5 min    Other Standing Exercises wall push ups 3x10    Shoulder Exercises: ROM/Strengthening   UBE (Upper Arm Bike) L3x6' alt FWD/BWD   Wall Pushups 20 reps   Other ROM/Strengthening Exercises lat pull down 3x10,  2plates   Shoulder Exercises: Stretch   Other Shoulder Stretches 3 way doorway stretch 30 sec x 2 each position    Shoulder Exercises: Body Blade   Flexion 30 seconds;5 reps   Modalities   Modalities Electrical Stimulation;Iontophoresis;Vasopneumatic   Scientist, physiologicallectrical Stimulation   Electrical Stimulation Location Rt shoulder   Electrical Stimulation Action IFC   Electrical Stimulation Parameters to tolerance   Electrical Stimulation Goals Pain   Iontophoresis   Type of Iontophoresis Dexamethasone   Location Rt shoulder acromion process   Dose 1.0 cc   Time 6 hr stat patch   Vasopneumatic   Number Minutes Vasopneumatic  15 minutes   Vasopnuematic Location  Shoulder   Vasopneumatic Pressure Low   Vasopneumatic Temperature  3*                PT Education - 02/19/16 1145    Education provided Yes   Education Details discussed increasing standing and walking  during the day - return to gym working LE's and cardio and beginning UE lat pulls and rows with light weights    Person(s) Educated Patient   Methods Explanation;Demonstration;Tactile cues;Verbal cues;Handout   Comprehension Verbalized understanding;Returned demonstration;Verbal cues required;Tactile cues required          PT Short Term Goals - 02/19/16 1152    PT SHORT TERM GOAL #4   Title improve FOTO =/< 50% limited ( 01/31/16)    Time 6   Period Weeks   Status On-going           PT Long Term Goals - 02/19/16 1153    PT LONG TERM GOAL #1   Title Independent with advanced HEP ( 03/13/16)   Time 12   Period Weeks   Status On-going   PT LONG TERM GOAL #2   Title demo Rt shoulder AROM WFL without pain to allow her to perform some quilting. ( 03/13/16)    Time 12   Period Weeks   Status On-going   PT LONG TERM GOAL #3   Title demo Rt shoulder strength =/> 5-/5 to allow return to work ( 03/13/16)    Time 12   Period Weeks   Status On-going   PT LONG TERM GOAL #4   Title report pain no greater than 2/10 with lifitng tasks ( 03/13/16)    Time 12   Period Weeks   Status On-going   PT LONG TERM GOAL #5   Title improve FOTO =/< 39% limited ( 03/13/16)    Time 12   Period Weeks   Status On-going               Plan - 02/19/16 1149    Clinical Impression Statement Continued progress with strength program; adding work simultation tasks today. Good response to ionto to Rt ahd with decresaed palpable tightness and decreased pain. Discussed improtance of gradually increasing activity level in preparaton for return to work - standing for 8 hr/day. Discussed gym program to work on cardio and strengthening.    Rehab Potential Good   PT Frequency 2x / week   PT Duration 12 weeks   PT Treatment/Interventions Ultrasound;Patient/family education;Passive range of motion;Cryotherapy;Dry needling;Electrical Stimulation;Moist Heat;Therapeutic exercise;Manual techniques;Vasopneumatic  Device;Taping;Iontophoresis 4mg /ml Dexamethasone   PT Next Visit Plan continue strengthening adding work simulation tasks    PT Home Exercise Plan begin gym program    Consulted and Agree with Plan of Care Patient      Patient will benefit from skilled therapeutic intervention in order to improve  the following deficits and impairments:  Postural dysfunction, Decreased strength, Increased edema, Decreased scar mobility, Pain, Impaired UE functional use, Decreased range of motion  Visit Diagnosis: Pain in right shoulder  Abnormal posture  Stiffness of right shoulder, not elsewhere classified  Muscle weakness (generalized)     Problem List Patient Active Problem List   Diagnosis Date Noted  . Complete rotator cuff tear 12/01/2015  . Wheezing 04/07/2014  . Impaired fasting glucose 07/26/2013  . Essential hypertension, benign 07/26/2013  . Back pain 07/26/2013  . Knee pain, bilateral 01/10/2013  . Morbid obesity (HCC) 01/10/2013  . Headache(784.0) 12/06/2012    Sheelah Ritacco Rober MinionP Arelie Kuzel PT, MPH  02/19/2016, 11:54 AM  [facility] [address] [place], [place], [address] Phone: [phone]   Fax:  [phone]  Name: Megan Petersen MRN: [id number] Date of Birth: October 02, 1960

## 2016-02-22 ENCOUNTER — Encounter: Payer: Self-pay | Admitting: Physical Therapy

## 2016-02-22 ENCOUNTER — Ambulatory Visit (INDEPENDENT_AMBULATORY_CARE_PROVIDER_SITE_OTHER): Payer: BLUE CROSS/BLUE SHIELD | Admitting: Physical Therapy

## 2016-02-22 DIAGNOSIS — M25611 Stiffness of right shoulder, not elsewhere classified: Secondary | ICD-10-CM | POA: Diagnosis not present

## 2016-02-22 DIAGNOSIS — M6281 Muscle weakness (generalized): Secondary | ICD-10-CM

## 2016-02-22 DIAGNOSIS — R293 Abnormal posture: Secondary | ICD-10-CM

## 2016-02-22 DIAGNOSIS — M25511 Pain in right shoulder: Secondary | ICD-10-CM

## 2016-02-22 NOTE — Patient Instructions (Addendum)
Strengthening: Resisted External Rotation - towel under elbow, red band.     Hold tubing in right hand, elbow at side and forearm across body. Rotate forearm out. Repeat _10___ times per set. Do __3__ sets per session. Do _1___ sessions per day.  Strengthening: Resisted Horizontal Abduction - green band    Hold tubing in right hand, elbow straight, arm in, parallel to floor. Pull arm out from side through pain-free range. Repeat _10___ times per set. Do _3___ sets per session. Do _1___ sessions per day.  PNF Strengthening: Resisted - start with green band if it gets difficult change to red    Standing with resistive band around each hand, bring right arm up and away, thumb back. Repeat __10__ times per set. Do _2-3___ sets per session. Do ___1_ sessions per day. Can lean against wall to stabilize self if needed.  Copyright  VHI. All rights reserved.

## 2016-02-22 NOTE — Therapy (Signed)
[facility] [address] [place], [place], [address] Phone: [phone]   Fax:  [phone]  Physical Therapy Treatment  Patient Details  Name: Megan Petersen MRN: [id number] Date of Birth: [birth year] Referring Provider: Dr. Shelle IronBeane  Encounter Date: 02/22/2016      PT End of Session - 02/22/16 1236    Visit Number 19   Number of Visits 24   Date for PT Re-Evaluation 03/13/16   PT Start Time 1152   PT Stop Time 1250   PT Time Calculation (min) 58 min   Activity Tolerance Patient tolerated treatment well      Past Medical History  Diagnosis Date  . Allergy   . Hypertension   . Hyperlipidemia   . Asthma   . GERD (gastroesophageal reflux disease)   . Morbid obesity (HCC)   . Osteoarthritis   . Rosacea   . Trigeminal neuralgia   . LSIL (low grade squamous intraepithelial lesion) on Pap smear   . Atypical glandular cells on Pap smear   . Primary cough headache   . Osteoarthritis   . Esophageal reflux   . Impaired fasting glucose   . Swelling of limb   . Headache   . Pneumonia 2016  . URI (upper respiratory infection)     cough with green sputum since 11-26-15  . Family history of adverse reaction to anesthesia     brother slow to awaken    Past Surgical History  Procedure Laterality Date  . Cholecystectomy    . Leep    . Right knee meniscus repair  2014  . Shoulder open rotator cuff repair Right 12/01/2015    Procedure: RIGHT SHOULDER MINI OPEN ROTATOR CUFF REPAIR WITH RESECTON OF DISTAL CLAVICLE AND SUBACROMIAL DECOMPRESSION;  Surgeon: Jene EveryJeffrey Beane, MD;  Location: [facility];  Service: Orthopedics;  Laterality: Right;    There were no vitals filed for this visit.      Subjective Assessment - 02/22/16 1159    Subjective Ahlayah reports she is having some soreness on the top of her shoulder in the incision.  Also has developed a red dot in the incision that is slightly raised.  ( palpated and it feels like a  stitch that is working its way out)    Currently in Pain? Yes   Pain Score 2    Pain Location Shoulder   Pain Orientation Right   Pain Descriptors / Indicators Sore   Pain Type Surgical pain   Pain Onset More than a month ago   Pain Frequency Intermittent                         [facility] Adult PT Treatment/Exercise - 02/22/16 0001    Shoulder Exercises: Standing   Horizontal ABduction Strengthening;Right;Theraband  3x10   Theraband Level (Shoulder Horizontal ABduction) Level 3 (Green)   External Rotation Strengthening;Right;Theraband  3x10   Theraband Level (Shoulder External Rotation) Level 2 (Red)   Internal Rotation Strengthening;Right;Theraband  3x10   Theraband Level (Shoulder Internal Rotation) Level 2 (Red)   Other Standing Exercises 3x10 wall pushups   45 reps bouncing ball on wall, abduct 90degrees   Other Standing Exercises SASH 3x10 green 1 set, red after   Shoulder Exercises: ROM/Strengthening   UBE (Upper Arm Bike) L3x6' alt FWD/BWD   Shoulder Exercises: Stretch   Other Shoulder Stretches 3 way doorway stretch 30 sec x 2 each position    Modalities   Modalities Electrical Stimulation;Vasopneumatic  Scientist, physiological IFC   Electrical Stimulation Parameters to tolerance   Electrical Stimulation Goals Pain   Vasopneumatic   Number Minutes Vasopneumatic  15 minutes   Vasopnuematic Location  Shoulder   Vasopneumatic Pressure Low   Vasopneumatic Temperature  3*                PT Education - 02/22/16 1215    Education provided Yes   Education Details HEP    Person(s) Educated Patient   Methods Explanation;Demonstration;Handout   Comprehension Returned demonstration;Verbalized understanding          PT Short Term Goals - 02/19/16 1152    PT SHORT TERM GOAL #4   Title improve FOTO =/< 50% limited ( 01/31/16)    Time 6   Period Weeks   Status On-going            PT Long Term Goals - 02/19/16 1153    PT LONG TERM GOAL #1   Title Independent with advanced HEP ( 03/13/16)   Time 12   Period Weeks   Status On-going   PT LONG TERM GOAL #2   Title demo Rt shoulder AROM WFL without pain to allow her to perform some quilting. ( 03/13/16)    Time 12   Period Weeks   Status On-going   PT LONG TERM GOAL #3   Title demo Rt shoulder strength =/> 5-/5 to allow return to work ( 03/13/16)    Time 12   Period Weeks   Status On-going   PT LONG TERM GOAL #4   Title report pain no greater than 2/10 with lifitng tasks ( 03/13/16)    Time 12   Period Weeks   Status On-going   PT LONG TERM GOAL #5   Title improve FOTO =/< 39% limited ( 03/13/16)    Time 12   Period Weeks   Status On-going               Plan - 02/22/16 1237    Clinical Impression Statement Simrit fatigued with higher level shoulder strengthening exercises, pain remained at a minimal.  She does have one small spot in the middle of her incision that is red, possibly a stitch working its way out    Rehab Potential Good   PT Frequency 2x / week   PT Duration 12 weeks   PT Treatment/Interventions Ultrasound;Patient/family education;Passive range of motion;Cryotherapy;Dry needling;Electrical Stimulation;Moist Heat;Therapeutic exercise;Manual techniques;Vasopneumatic Device;Taping;Iontophoresis /ml Dexamethasone   PT Next Visit Plan continue strengthening adding work simulation tasks    Consulted and Agree with Plan of Care Patient      Patient will benefit from skilled therapeutic intervention in order to improve the following deficits and impairments:  Postural dysfunction, Decreased strength, Increased edema, Decreased scar mobility, Pain, Impaired UE functional use, Decreased range of motion  Visit Diagnosis: Abnormal posture  Pain in right shoulder  Stiffness of right shoulder, not elsewhere classified  Muscle weakness (generalized)     Problem List Patient  Active Problem List   Diagnosis Date Noted  . Complete rotator cuff tear 12/01/2015  . Wheezing 04/07/2014  . Impaired fasting glucose 07/26/2013  . Essential hypertension, benign 07/26/2013  . Back pain 07/26/2013  . Knee pain, bilateral 01/10/2013  . Morbid obesity (HCC) 01/10/2013  . Headache(784.0) 12/06/2012    Roderic Scarce PT  02/22/2016, 12:38 PM  [facility] [address] [place],  [place], [id number] Phone: [phone]   Fax:  [phone]  Name: Megan Petersen MRN: [id number] Date of Birth: Mar 02, 1961

## 2016-02-26 ENCOUNTER — Encounter: Payer: Self-pay | Admitting: Rehabilitative and Restorative Service Providers"

## 2016-02-26 ENCOUNTER — Ambulatory Visit (INDEPENDENT_AMBULATORY_CARE_PROVIDER_SITE_OTHER): Payer: BLUE CROSS/BLUE SHIELD | Admitting: Rehabilitative and Restorative Service Providers"

## 2016-02-26 DIAGNOSIS — R293 Abnormal posture: Secondary | ICD-10-CM | POA: Diagnosis not present

## 2016-02-26 DIAGNOSIS — M6281 Muscle weakness (generalized): Secondary | ICD-10-CM

## 2016-02-26 DIAGNOSIS — M25611 Stiffness of right shoulder, not elsewhere classified: Secondary | ICD-10-CM

## 2016-02-26 DIAGNOSIS — M25511 Pain in right shoulder: Secondary | ICD-10-CM

## 2016-02-26 NOTE — Therapy (Signed)
[facility] [address] [place], [place], [address] Phone: [phone]   Fax:  [phone]  Physical Therapy Treatment  Patient Details  Name: Megan Petersen MRN: [id number] Date of Birth: Oct 30, 1960 Referring Provider: Dr. Shelle IronBeane  Encounter Date: 02/26/2016      PT End of Session - 02/26/16 1202    Visit Number 20   Number of Visits 24   Date for PT Re-Evaluation 03/13/16   PT Start Time 1142   PT Stop Time 1250   PT Time Calculation (min) 68 min   Activity Tolerance Patient tolerated treatment well      Past Medical History  Diagnosis Date  . Allergy   . Hypertension   . Hyperlipidemia   . Asthma   . GERD (gastroesophageal reflux disease)   . Morbid obesity (HCC)   . Osteoarthritis   . Rosacea   . Trigeminal neuralgia   . LSIL (low grade squamous intraepithelial lesion) on Pap smear   . Atypical glandular cells on Pap smear   . Primary cough headache   . Osteoarthritis   . Esophageal reflux   . Impaired fasting glucose   . Swelling of limb   . Headache   . Pneumonia 2016  . URI (upper respiratory infection)     cough with green sputum since 11-26-15  . Family history of adverse reaction to anesthesia     brother slow to awaken    Past Surgical History  Procedure Laterality Date  . Cholecystectomy    . Leep    . Right knee meniscus repair  2014  . Shoulder open rotator cuff repair Right 12/01/2015    Procedure: RIGHT SHOULDER MINI OPEN ROTATOR CUFF REPAIR WITH RESECTON OF DISTAL CLAVICLE AND SUBACROMIAL DECOMPRESSION;  Surgeon: Jene EveryJeffrey Beane, MD;  Location: [facility];  Service: Orthopedics;  Laterality: Right;    There were no vitals filed for this visit.      Subjective Assessment - 02/26/16 1159    Subjective Went to the gym this am - did some rows but didn't get to the lat pull. She did work cardio(eliptical and treatmill) trying to get ready to go back to work. Shoulder is feeling okay today.    Currently in Pain? No/denies            [facility] - 02/26/16 0001    Assessment   Medical Diagnosis Rt RTC repair   Referring Provider Dr. Shelle IronBeane   Onset Date/Surgical Date 12/01/15   Hand Dominance Right   Next MD Visit 03/19/16   AROM   Right Shoulder Flexion 155 Degrees   Right Shoulder ABduction 168 Degrees   Right Shoulder Internal Rotation --  standing functional IR thumb to T8   Right Shoulder External Rotation 72 Degrees                     [facility] Adult PT Treatment/Exercise - 02/26/16 0001    Shoulder Exercises: Standing   Horizontal ABduction Strengthening;Right;Theraband  3x10   Theraband Level (Shoulder Horizontal ABduction) Level 3 (Green)   External Rotation Strengthening;Right;Theraband  3x10   Theraband Level (Shoulder External Rotation) Level 2 (Red)   Internal Rotation Strengthening;Right;Theraband  3x10   Theraband Level (Shoulder Internal Rotation) Level 2 (Red)   Other Standing Exercises 3x10 wall pushups   50 reps bouncing ball on wall, abduct 90degrees   Other Standing Exercises SASH 3x10 green; lifting 20# box thigh to waist and reverse x 10  Shoulder Exercises: ROM/Strengthening   UBE (Upper Arm Bike) L3x6' alt FWD/BWD   Wall Pushups --  3x10   Other ROM/Strengthening Exercises lat pull down 3x10,  3 plates   Shoulder Exercises: Stretch   Other Shoulder Stretches 3 way doorway stretch 30 sec x 2 each position    Shoulder Exercises: Body Blade   Flexion 30 seconds;5 reps   ABduction 45 seconds;2 reps   Modalities   Modalities Electrical Stimulation;Vasopneumatic   Electrical Stimulation   Electrical Stimulation Location Rt shoulder   Electrical Stimulation Action IFC   Electrical Stimulation Parameters to tolerance   Electrical Stimulation Goals Pain   Iontophoresis   Type of Iontophoresis Dexamethasone   Location Rt shoulder proxinmal mid deltoid area    Dose 1.0 cc   Time 8 hr stat patch   Vasopneumatic    Number Minutes Vasopneumatic  15 minutes   Vasopnuematic Location  Shoulder   Vasopneumatic Pressure Low   Vasopneumatic Temperature  3*                  PT Short Term Goals - 02/19/16 1152    PT SHORT TERM GOAL #4   Title improve FOTO =/< 50% limited ( 01/31/16)    Time 6   Period Weeks   Status On-going           PT Long Term Goals - 02/19/16 1153    PT LONG TERM GOAL #1   Title Independent with advanced HEP ( 03/13/16)   Time 12   Period Weeks   Status On-going   PT LONG TERM GOAL #2   Title demo Rt shoulder AROM WFL without pain to allow her to perform some quilting. ( 03/13/16)    Time 12   Period Weeks   Status On-going   PT LONG TERM GOAL #3   Title demo Rt shoulder strength =/> 5-/5 to allow return to work ( 03/13/16)    Time 12   Period Weeks   Status On-going   PT LONG TERM GOAL #4   Title report pain no greater than 2/10 with lifitng tasks ( 03/13/16)    Time 12   Period Weeks   Status On-going   PT LONG TERM GOAL #5   Title improve FOTO =/< 39% limited ( 03/13/16)    Time 12   Period Weeks   Status On-going               Plan - 02/26/16 1203    Clinical Impression Statement Breeze continues to progress toward goals of therapy, increasing exercise and functional activities at home. Continues to fatigue with higher activities but endurance is improving.   Rehab Potential Good   PT Frequency 2x / week   PT Duration 12 weeks   PT Treatment/Interventions Ultrasound;Patient/family education;Passive range of motion;Cryotherapy;Dry needling;Electrical Stimulation;Moist Heat;Therapeutic exercise;Manual techniques;Vasopneumatic Device;Taping;Iontophoresis /ml Dexamethasone   PT Next Visit Plan continue strengthening adding work simulation tasks    PT Home Exercise Plan patient to continue with gym program increasing resistive training    Consulted and Agree with Plan of Care Patient      Patient will benefit from skilled therapeutic  intervention in order to improve the following deficits and impairments:  Postural dysfunction, Decreased strength, Increased edema, Decreased scar mobility, Pain, Impaired UE functional use, Decreased range of motion  Visit Diagnosis: Abnormal posture  Pain in right shoulder  Stiffness of right shoulder, not elsewhere classified  Muscle weakness (generalized)     Problem List  Patient Active Problem List   Diagnosis Date Noted  . Complete rotator cuff tear 12/01/2015  . Wheezing 04/07/2014  . Impaired fasting glucose 07/26/2013  . Essential hypertension, benign 07/26/2013  . Back pain 07/26/2013  . Knee pain, bilateral 01/10/2013  . Morbid obesity (HCC) 01/10/2013  . Headache(784.0) 12/06/2012    Cloma Rahrig Rober MinionP Dora Clauss PT, MPH  02/26/2016, 12:45 PM  [facility] [address] [place], [place], [address] Phone: [phone]   Fax:  [phone]  Name: Megan Petersen MRN: [id number] Date of Birth: Mar 29, 1961

## 2016-02-29 ENCOUNTER — Ambulatory Visit (INDEPENDENT_AMBULATORY_CARE_PROVIDER_SITE_OTHER): Payer: BLUE CROSS/BLUE SHIELD | Admitting: Physical Therapy

## 2016-02-29 ENCOUNTER — Encounter: Payer: Self-pay | Admitting: Physical Therapy

## 2016-02-29 DIAGNOSIS — R293 Abnormal posture: Secondary | ICD-10-CM | POA: Diagnosis not present

## 2016-02-29 DIAGNOSIS — M6281 Muscle weakness (generalized): Secondary | ICD-10-CM | POA: Diagnosis not present

## 2016-02-29 DIAGNOSIS — M25511 Pain in right shoulder: Secondary | ICD-10-CM

## 2016-02-29 DIAGNOSIS — M25611 Stiffness of right shoulder, not elsewhere classified: Secondary | ICD-10-CM

## 2016-02-29 NOTE — Therapy (Signed)
[place] [address] [place], [place], [address] Phone: [phone]   Fax:  [phone]  Physical Therapy Treatment  Patient Details  Name: Megan Petersen MRN: [id number] Date of Birth: December 22, 1960 Referring Provider: Dr Tonita Cong  Encounter Date: 02/29/2016      PT End of Session - 02/29/16 1445    Visit Number 21   Number of Visits 24   Date for PT Re-Evaluation 03/13/16   PT Start Time 6606   PT Stop Time 0045   PT Time Calculation (min) 59 min   Activity Tolerance Patient tolerated treatment well      Past Medical History  Diagnosis Date  . Allergy   . Hypertension   . Hyperlipidemia   . Asthma   . GERD (gastroesophageal reflux disease)   . Morbid obesity ([place])   . Osteoarthritis   . Rosacea   . Trigeminal neuralgia   . LSIL (low grade squamous intraepithelial lesion) on Pap smear   . Atypical glandular cells on Pap smear   . Primary cough headache   . Osteoarthritis   . Esophageal reflux   . Impaired fasting glucose   . Swelling of limb   . Headache   . Pneumonia 2016  . URI (upper respiratory infection)     cough with green sputum since 11-26-15  . Family history of adverse reaction to anesthesia     brother slow to awaken    Past Surgical History  Procedure Laterality Date  . Cholecystectomy    . Leep    . Right knee meniscus repair  2014  . Shoulder open rotator cuff repair Right 12/01/2015    Procedure: RIGHT SHOULDER MINI OPEN ROTATOR CUFF REPAIR WITH RESECTON OF DISTAL CLAVICLE AND SUBACROMIAL DECOMPRESSION;  Surgeon: Susa Day, MD;  Location: [facility];  Service: Orthopedics;  Laterality: Right;    There were no vitals filed for this visit.      Subjective Assessment - 02/29/16 1445    Subjective Pt states her Rt knee is hurting her, she walked on the treadmill and on the elliptical for10 min yesterday and woke up sore.    Patient Stated Goals sleep again, hold her grand daughters, ( 17  month old)    Currently in Pain? Yes   Pain Score 1    Pain Location Shoulder   Pain Orientation Right   Pain Descriptors / Indicators Sore  from working out, not pain   Pain Type Acute pain   Pain Onset More than a month ago   Pain Frequency Intermittent   Aggravating Factors  exercising more muscles   Pain Relieving Factors TENs            [facility] PT Assessment - 02/29/16 0001    Assessment   Medical Diagnosis Rt RTC repair   Referring Provider Dr Tonita Cong   Onset Date/Surgical Date 12/01/15   Hand Dominance Right   Next MD Visit 03/19/16   AROM   AROM Assessment Site Shoulder   Right/Left Shoulder Right   Right Shoulder Flexion 170 Degrees   Right Shoulder ABduction 154 Degrees   Right Shoulder Internal Rotation --  WNL   Right Shoulder External Rotation 72 Degrees   PROM   Right Shoulder External Rotation 90 Degrees   Strength   Strength Assessment Site Shoulder   Right/Left Shoulder Right   Right Shoulder Flexion 5/5   Right Shoulder Extension 5/5   Right Shoulder ABduction 5/5   Right Shoulder Internal Rotation 5/5  Right Shoulder External Rotation 4/5                     [facility] Adult PT Treatment/Exercise - 02/29/16 0001    Shoulder Exercises: Supine   Flexion Strengthening;Right;20 reps;Weights   Shoulder Flexion Weight (lbs) 3   Other Supine Exercises Rt knee: quad sets, SLR ER to help with pain, DF with blue band    Shoulder Exercises: Sidelying   External Rotation Strengthening;Right;Weights  3x10   External Rotation Weight (lbs) 3   Other Sidelying Exercises empty can Rt UE, 3x10, 2#   Shoulder Exercises: Standing   External Rotation Strengthening;Right;Theraband  3x10 with arm abduct 60 degrees   Shoulder Exercises: ROM/Strengthening   UBE (Upper Arm Bike) L3x6' alt FWD/BWD   Shoulder Exercises: Stretch   External Rotation Stretch --  3' holding 2# wt in supine   Other Shoulder Stretches doorway stretch low, behind back stretch.     Modalities   Modalities Electrical Stimulation;Vasopneumatic;Iontophoresis   Horticulturist, commercial IFC   Electrical Stimulation Parameters to tolernce   Electrical Stimulation Goals Pain   Iontophoresis   Type of Iontophoresis Dexamethasone   Location Rt shoulder proxinmal mid deltoid area    Dose 1.0 cc   Time 8 hr stat patch  133mp   Vasopneumatic   Number Minutes Vasopneumatic  15 minutes   Vasopnuematic Location  Shoulder   Vasopneumatic Pressure Low   Vasopneumatic Temperature  3*                  PT Short Term Goals - 02/29/16 1449    PT SHORT TERM GOAL #1   Title Independent with initial HEP ( 01/17/16)    Status Achieved   PT SHORT TERM GOAL #2   Title demo [facility]t shoulder [facility]( 01/31/16)    Status Achieved   PT SHORT TERM GOAL #3   Title tolerate inital Rt shoulder strengthening exercise ( 01/31/16)    Status Achieved   PT SHORT TERM GOAL #4   Title improve FOTO =/< 50% limited ( 01/31/16)    Status Achieved           PT Long Term Goals - 02/29/16 1450    PT LONG TERM GOAL #1   Title Independent with advanced HEP ( 03/13/16)   Time 12   Period Weeks   Status On-going   PT LONG TERM GOAL #2   Title demo Rt shoulder AROM WFL without pain to allow her to perform some quilting. ( 03/13/16)    Time --   Period --   Status Achieved   PT LONG TERM GOAL #3   Title demo Rt shoulder strength =/> 5-/5 to allow return to work ( 03/13/16)    Time 12   Period Weeks   Status On-going   PT LONG TERM GOAL #4   Title report pain no greater than 2/10 with lifitng tasks ( 03/13/16)    Status Achieved   PT LONG TERM GOAL #5   Title improve FOTO =/< 39% limited ( 03/13/16)    Time 12   Period Weeks   Status On-going               Plan - 02/29/16 1512    Clinical Impression Statement RBriennais getting stronger, she is only weak with ER, her ROM is functional now and she is  able to perform more activities.  She has met some more goals and progressing to the others.  She is most concerned about standing all day at work and reaching behind her.    Rehab Potential Good   PT Frequency 2x / week   PT Duration 12 weeks   PT Treatment/Interventions Ultrasound;Patient/family education;Passive range of motion;Cryotherapy;Dry needling;Electrical Stimulation;Moist Heat;Therapeutic exercise;Manual techniques;Vasopneumatic Device;Taping;Iontophoresis 86m/ml Dexamethasone   PT Next Visit Plan continue strengthening adding work simulation tasks    Consulted and Agree with Plan of Care Patient      Patient will benefit from skilled therapeutic intervention in order to improve the following deficits and impairments:  Postural dysfunction, Decreased strength, Increased edema, Decreased scar mobility, Pain, Impaired UE functional use, Decreased range of motion  Visit Diagnosis: Muscle weakness (generalized)  Stiffness of right shoulder, not elsewhere classified  Pain in right shoulder  Abnormal posture     Problem List Patient Active Problem List   Diagnosis Date Noted  . Complete rotator cuff tear 12/01/2015  . Wheezing 04/07/2014  . Impaired fasting glucose 07/26/2013  . Essential hypertension, benign 07/26/2013  . Back pain 07/26/2013  . Knee pain, bilateral 01/10/2013  . Morbid obesity ([place] 01/10/2013  . Headache(784.0) 12/06/2012    SJeral PinchPT  02/29/2016, 3:31 PM  [facility] [address]Phone: [phone]  Fax:  [phone] Name: RWessie ShanksMRN: [id number] of Birth: [birth year]

## 2016-03-05 ENCOUNTER — Ambulatory Visit (INDEPENDENT_AMBULATORY_CARE_PROVIDER_SITE_OTHER): Payer: BLUE CROSS/BLUE SHIELD | Admitting: Physical Therapy

## 2016-03-05 ENCOUNTER — Encounter: Payer: Self-pay | Admitting: Physical Therapy

## 2016-03-05 DIAGNOSIS — M6281 Muscle weakness (generalized): Secondary | ICD-10-CM | POA: Diagnosis not present

## 2016-03-05 DIAGNOSIS — M25511 Pain in right shoulder: Secondary | ICD-10-CM | POA: Diagnosis not present

## 2016-03-05 DIAGNOSIS — R293 Abnormal posture: Secondary | ICD-10-CM

## 2016-03-05 DIAGNOSIS — M25611 Stiffness of right shoulder, not elsewhere classified: Secondary | ICD-10-CM

## 2016-03-05 NOTE — Therapy (Signed)
[facility] [place] [address] [place], [place], [address] Phone: [phone]   Fax:  [phone]  Physical Therapy Treatment  Patient Details  Name: Megan Petersen MRN: [id number] Date of Birth: [birth year] Referring Provider: Dr Shelle Iron  Encounter Date: 03/05/2016      PT End of Session - 03/05/16 1147    Visit Number 22   Number of Visits 24   Date for PT Re-Evaluation 03/13/16   PT Start Time 1148   PT Stop Time 1245   PT Time Calculation (min) 57 min   Activity Tolerance Patient limited by pain  in her Rt knee, not related to shoulder      Past Medical History  Diagnosis Date  . Allergy   . Hypertension   . Hyperlipidemia   . Asthma   . GERD (gastroesophageal reflux disease)   . Morbid obesity (HCC)   . Osteoarthritis   . Rosacea   . Trigeminal neuralgia   . LSIL (low grade squamous intraepithelial lesion) on Pap smear   . Atypical glandular cells on Pap smear   . Primary cough headache   . Osteoarthritis   . Esophageal reflux   . Impaired fasting glucose   . Swelling of limb   . Headache   . Pneumonia 2016  . URI (upper respiratory infection)     cough with green sputum since 11-26-15  . Family history of adverse reaction to anesthesia     brother slow to awaken    Past Surgical History  Procedure Laterality Date  . Cholecystectomy    . Leep    . Right knee meniscus repair  2014  . Shoulder open rotator cuff repair Right 12/01/2015    Procedure: RIGHT SHOULDER MINI OPEN ROTATOR CUFF REPAIR WITH RESECTON OF DISTAL CLAVICLE AND SUBACROMIAL DECOMPRESSION;  Surgeon: Jene Every, MD;  Location: [facility];  Service: Orthopedics;  Laterality: Right;    There were no vitals filed for this visit.      Subjective Assessment - 03/05/16 1148    Subjective Megan Petersen said her Rt knee is killing her, she is seeing the MD tomorrow.     Currently in Pain? No/denies  in the shoulder, only Rt knee pain                          [facility] Adult PT Treatment/Exercise - 03/05/16 0001    Exercises   Exercises Shoulder   Shoulder Exercises: Supine   Horizontal ABduction Strengthening;Both;Theraband  30 reps   Theraband Level (Shoulder Horizontal ABduction) Level 3 (Green)   External Rotation Strengthening;Both;Theraband  30 reps   Theraband Level (Shoulder External Rotation) Level 3 (Green)   Flexion Strengthening;Both;15 reps;Theraband  overhead pull   Theraband Level (Shoulder Flexion) Level 3 (Green)   Shoulder Flexion Weight (lbs) --  3x8 with 4# supine   Other Supine Exercises SASH 3x10 green band   Shoulder Exercises: Sidelying   External Rotation Strengthening;Right;Weights  3x10 with towel under arm   External Rotation Weight (lbs) 4   Shoulder Exercises: Standing   Flexion Strengthening;Right;Weights  2#, shoulder ladder   Other Standing Exercises rings both directions, one rep with 2# wt on wrist   Shoulder Exercises: ROM/Strengthening   UBE (Upper Arm Bike) L3x6' alt FWD/BWD   Modalities   Modalities Electrical Stimulation;Iontophoresis;Vasopneumatic   Programme researcher, broadcasting/film/video Location Rt shoulder, Rt knee   Electrical Stimulation Action premod   Electrical Stimulation Parameters to tolerance  Electrical Stimulation Goals Pain   Iontophoresis   Type of Iontophoresis Dexamethasone   Location Rt shoulder proxinmal mid deltoid area    Dose 1.0 cc   Time 8 hr stat patch   Vasopneumatic   Number Minutes Vasopneumatic  15 minutes   Vasopnuematic Location  Shoulder;Knee  Rt   Vasopneumatic Pressure Low   Vasopneumatic Temperature  3*                  PT Short Term Goals - 02/29/16 1449    PT SHORT TERM GOAL #1   Title Independent with initial HEP ( 01/17/16)    Status Achieved   PT SHORT TERM GOAL #2   Title demo Rt shoulder [facility] ( 01/31/16)    Status Achieved   PT SHORT TERM GOAL #3   Title tolerate inital Rt  shoulder strengthening exercise ( 01/31/16)    Status Achieved   PT SHORT TERM GOAL #4   Title improve FOTO =/< 50% limited ( 01/31/16)    Status Achieved           PT Long Term Goals - 03/05/16 1209    PT LONG TERM GOAL #1   Title Independent with advanced HEP ( 03/13/16)   Status On-going   PT LONG TERM GOAL #2   Title demo Rt shoulder AROM WFL without pain to allow her to perform some quilting. ( 03/13/16)    Status Achieved   PT LONG TERM GOAL #3   Title demo Rt shoulder strength =/> 5-/5 to allow return to work ( 03/13/16)    Status On-going   PT LONG TERM GOAL #4   Title report pain no greater than 2/10 with lifitng tasks ( 03/13/16)    Status Achieved   PT LONG TERM GOAL #5   Title improve FOTO =/< 39% limited ( 03/13/16)    Status On-going               Plan - 03/05/16 1213    Clinical Impression Statement Megan Petersen was limited in performing standing ther ex for her shoulder today due to Rt knee pain, did well with ther ex lying down. Progressing to the goals.    Rehab Potential Good   PT Frequency 2x / week   PT Duration 12 weeks   PT Treatment/Interventions Ultrasound;Patient/family education;Passive range of motion;Cryotherapy;Dry needling;Electrical Stimulation;Moist Heat;Therapeutic exercise;Manual techniques;Vasopneumatic Device;Taping;Iontophoresis 4mg /ml Dexamethasone   PT Next Visit Plan continue strengthening    Consulted and Agree with Plan of Care Patient      Patient will benefit from skilled therapeutic intervention in order to improve the following deficits and impairments:  Postural dysfunction, Decreased strength, Increased edema, Decreased scar mobility, Pain, Impaired UE functional use, Decreased range of motion  Visit Diagnosis: Muscle weakness (generalized)  Stiffness of right shoulder, not elsewhere classified  Pain in right shoulder  Abnormal posture     Problem List Patient Active Problem List   Diagnosis Date Noted  . Complete  rotator cuff tear 12/01/2015  . Wheezing 04/07/2014  . Impaired fasting glucose 07/26/2013  . Essential hypertension, benign 07/26/2013  . Back pain 07/26/2013  . Knee pain, bilateral 01/10/2013  . Morbid obesity (HCC) 01/10/2013  . Headache(784.0) 12/06/2012    Roderic ScarceSusan Shaver PT 03/05/2016, 12:46 PM  [facility] [address] [place], [place], [address] Phone: [phone]   Fax:  [phone]  Name: Megan Petersen MRN: [id number] Date of Birth: 1961/05/03

## 2016-03-08 ENCOUNTER — Encounter: Payer: BLUE CROSS/BLUE SHIELD | Admitting: Rehabilitative and Restorative Service Providers"

## 2016-03-13 ENCOUNTER — Ambulatory Visit (INDEPENDENT_AMBULATORY_CARE_PROVIDER_SITE_OTHER): Payer: BLUE CROSS/BLUE SHIELD | Admitting: Rehabilitative and Restorative Service Providers"

## 2016-03-13 ENCOUNTER — Encounter: Payer: Self-pay | Admitting: Rehabilitative and Restorative Service Providers"

## 2016-03-13 DIAGNOSIS — M6281 Muscle weakness (generalized): Secondary | ICD-10-CM

## 2016-03-13 DIAGNOSIS — M25611 Stiffness of right shoulder, not elsewhere classified: Secondary | ICD-10-CM

## 2016-03-13 DIAGNOSIS — M25511 Pain in right shoulder: Secondary | ICD-10-CM | POA: Diagnosis not present

## 2016-03-13 DIAGNOSIS — R293 Abnormal posture: Secondary | ICD-10-CM

## 2016-03-13 NOTE — Therapy (Signed)
[facility] [facility] [address] [place], [place], [address] Phone: [phone]   Fax:  [phone]  Physical Therapy Treatment  Patient Details  Name: Megan Petersen MRN: [id number] Date of Birth: June 12, 1961 Referring Provider: Dr Shelle IronBeane  Encounter Date: 03/13/2016      PT End of Session - 03/13/16 0859    Visit Number 23   Number of Visits 24   Date for PT Re-Evaluation 03/13/16   PT Start Time 0849   PT Stop Time 0940   PT Time Calculation (min) 51 min   Activity Tolerance Patient tolerated treatment well      Past Medical History  Diagnosis Date  . Allergy   . Hypertension   . Hyperlipidemia   . Asthma   . GERD (gastroesophageal reflux disease)   . Morbid obesity (HCC)   . Osteoarthritis   . Rosacea   . Trigeminal neuralgia   . LSIL (low grade squamous intraepithelial lesion) on Pap smear   . Atypical glandular cells on Pap smear   . Primary cough headache   . Osteoarthritis   . Esophageal reflux   . Impaired fasting glucose   . Swelling of limb   . Headache   . Pneumonia 2016  . URI (upper respiratory infection)     cough with green sputum since 11-26-15  . Family history of adverse reaction to anesthesia     brother slow to awaken    Past Surgical History  Procedure Laterality Date  . Cholecystectomy    . Leep    . Right knee meniscus repair  2014  . Shoulder open rotator cuff repair Right 12/01/2015    Procedure: RIGHT SHOULDER MINI OPEN ROTATOR CUFF REPAIR WITH RESECTON OF DISTAL CLAVICLE AND SUBACROMIAL DECOMPRESSION;  Surgeon: Jene EveryJeffrey Beane, MD;  Location: [facility];  Service: Orthopedics;  Laterality: Right;    There were no vitals filed for this visit.      Subjective Assessment - 03/13/16 0900    Subjective Seen by MD last week and received injection Rt knee - on crutches for a week. Now noted improvement in knee pain and is no longer on meds. MD wanted to take her out of work until September with  the knee. She returns to Dr. Shelle IronBeane Monday to hopefully be released to return to work. She has used all her FMAL and they are holding her job - she wants to RTW to avoid loosing her job.    Currently in Pain? No/denies                         [facility] Treatment/Exercise - 03/13/16 0001    Shoulder Exercises: Seated   Extension Strengthening;Both;20 reps;Theraband   Theraband Level (Shoulder Extension) Level 4 (Blue)   Row Strengthening;Both;20 reps;Theraband   Theraband Level (Shoulder Row) Level 4 (Blue)   Horizontal ABduction Strengthening;Both;20 reps;Theraband   Theraband Level (Shoulder Horizontal ABduction) Level 3 (Green)   External Rotation Strengthening;Right;20 reps;Theraband   Theraband Level (Shoulder External Rotation) Level 4 (Blue)   Flexion Strengthening;Right;5 reps;Theraband   Theraband Level (Shoulder Flexion) Level 2 (Red)   Other Seated Exercises 10 reps each, empty can to 90degrees, bilat, single. & altnerating 2 #   Shoulder Exercises: ROM/Strengthening   UBE (Upper Arm Bike) L4x6' alt FWD/BWD   Shoulder Exercises: Stretch   Other Shoulder Stretches doorway stretch low, behind back stretch.    Shoulder Exercises: Body Blade   Flexion 30 seconds;5 reps  sitting   ABduction 45 seconds;2 reps  sitting   Modalities   Modalities Electrical Stimulation;Iontophoresis;Vasopneumatic   Scientist, physiological IFC   Electrical Stimulation Parameters to tolerance   Electrical Stimulation Goals Pain   Iontophoresis   Type of Iontophoresis Dexamethasone   Location Rt shoulder proxinmal mid deltoid area    Dose 1.0 cc   Time 8 hr stat patch   Vasopneumatic   Number Minutes Vasopneumatic  15 minutes   Vasopnuematic Location  Shoulder;Knee  Rt   Vasopneumatic Pressure Low   Vasopneumatic Temperature  3*                  PT Short Term Goals - 02/29/16 1449     PT SHORT TERM GOAL #1   Title Independent with initial HEP ( 01/17/16)    Status Achieved   PT SHORT TERM GOAL #2   Title demo Rt shoulder PROM to [facility] ( 01/31/16)    Status Achieved   PT SHORT TERM GOAL #3   Title tolerate inital Rt shoulder strengthening exercise ( 01/31/16)    Status Achieved   PT SHORT TERM GOAL #4   Title improve FOTO =/< 50% limited ( 01/31/16)    Status Achieved           PT Long Term Goals - 03/05/16 1209    PT LONG TERM GOAL #1   Title Independent with advanced HEP ( 03/13/16)   Status On-going   PT LONG TERM GOAL #2   Title demo Rt shoulder AROM WFL without pain to allow her to perform some quilting. ( 03/13/16)    Status Achieved   PT LONG TERM GOAL #3   Title demo Rt shoulder strength =/> 5-/5 to allow return to work ( 03/13/16)    Status On-going   PT LONG TERM GOAL #4   Title report pain no greater than 2/10 with lifitng tasks ( 03/13/16)    Status Achieved   PT LONG TERM GOAL #5   Title improve FOTO =/< 39% limited ( 03/13/16)    Status On-going               Plan - 03/13/16 0934    Clinical Impression Statement Shoulder continues to improve - fatigue with some exercises. Worked in sitting today to avoid irritation of the knee. Returns to MD Monday 03/18/16.    Rehab Potential Good   PT Frequency 2x / week   PT Duration 3 weeks   PT Treatment/Interventions Ultrasound;Patient/family education;Passive range of motion;Cryotherapy;Dry needling;Electrical Stimulation;Moist Heat;Therapeutic exercise;Manual techniques;Vasopneumatic Device;Taping;Iontophoresis 4mg /ml Dexamethasone   PT Next Visit Plan continue strengthening; reassess for MD note at next visit   Consulted and Agree with Plan of Care Patient      Patient will benefit from skilled therapeutic intervention in order to improve the following deficits and impairments:  Postural dysfunction, Decreased strength, Increased edema, Decreased scar mobility, Pain, Impaired UE functional use,  Decreased range of motion  Visit Diagnosis: Muscle weakness (generalized)  Stiffness of right shoulder, not elsewhere classified  Pain in right shoulder  Abnormal posture     Problem List Patient Active Problem List   Diagnosis Date Noted  . Complete rotator cuff tear 12/01/2015  . Wheezing 04/07/2014  . Impaired fasting glucose 07/26/2013  . Essential hypertension, benign 07/26/2013  . Back pain 07/26/2013  . Knee pain, bilateral 01/10/2013  . Morbid obesity (HCC) 01/10/2013  . Headache(784.0) 12/06/2012  Kelson Queenan Rober Minion PT, MPH  03/13/2016, 9:38 AM  [facility] [address] [place], [place], [address] Phone: [phone]   Fax:  [phone]  Name: Megan Petersen MRN: [id number] Date of Birth: 02/12/1961

## 2016-03-15 ENCOUNTER — Encounter: Payer: Self-pay | Admitting: Rehabilitative and Restorative Service Providers"

## 2016-03-15 ENCOUNTER — Ambulatory Visit (INDEPENDENT_AMBULATORY_CARE_PROVIDER_SITE_OTHER): Payer: BLUE CROSS/BLUE SHIELD | Admitting: Rehabilitative and Restorative Service Providers"

## 2016-03-15 DIAGNOSIS — R293 Abnormal posture: Secondary | ICD-10-CM

## 2016-03-15 DIAGNOSIS — M25611 Stiffness of right shoulder, not elsewhere classified: Secondary | ICD-10-CM | POA: Diagnosis not present

## 2016-03-15 DIAGNOSIS — M25511 Pain in right shoulder: Secondary | ICD-10-CM

## 2016-03-15 DIAGNOSIS — M6281 Muscle weakness (generalized): Secondary | ICD-10-CM | POA: Diagnosis not present

## 2016-03-15 NOTE — Therapy (Addendum)
[place], [place], [address] Phone: [phone]   Fax:  [phone]  Physical Therapy Treatment  Patient Details  Name: Megan Petersen MRN: [id number] Date of Birth: 12-04-1960 Referring Provider: Dr. Tonita Cong  Encounter Date: 03/15/2016      PT End of Session - 03/15/16 1341    Visit Number 24   Number of Visits 24   Date for PT Re-Evaluation 03/13/16   PT Start Time 1339   PT Stop Time 1440   PT Time Calculation (min) 61 min   Activity Tolerance Patient tolerated treatment well      Past Medical History  Diagnosis Date  . Allergy   . Hypertension   . Hyperlipidemia   . Asthma   . GERD (gastroesophageal reflux disease)   . Morbid obesity ([place])   . Osteoarthritis   . Rosacea   . Trigeminal neuralgia   . LSIL (low grade squamous intraepithelial lesion) on Pap smear   . Atypical glandular cells on Pap smear   . Primary cough headache   . Osteoarthritis   . Esophageal reflux   . Impaired fasting glucose   . Swelling of limb   . Headache   . Pneumonia 2016  . URI (upper respiratory infection)     cough with green sputum since 11-26-15  . Family history of adverse reaction to anesthesia     brother slow to awaken    Past Surgical History  Procedure Laterality Date  . Cholecystectomy    . Leep    . Right knee meniscus repair  2014  . Shoulder open rotator cuff repair Right 12/01/2015    Procedure: RIGHT SHOULDER MINI OPEN ROTATOR CUFF REPAIR WITH RESECTON OF DISTAL CLAVICLE AND SUBACROMIAL DECOMPRESSION;  Surgeon: Susa Day, MD;  Location: [facility];  Service: Orthopedics;  Laterality: Right;    There were no vitals filed for this visit.      Subjective Assessment - 03/15/16 1352    Subjective Knee is feeling better. Still hurts but can move and walk on the Rt LE better. Pleased with shoulder and her progress with rehab. Feels she is ready to return to work if her knee continues to  improve.    Currently in Pain? No/denies            [facility] PT Assessment - 03/15/16 0001    Assessment   Medical Diagnosis Rt RTC repair   Referring Provider Dr. Tonita Cong   Onset Date/Surgical Date 12/01/15   Hand Dominance Right   Next MD Visit 03/19/16   Observation/Other Assessments   Focus on Therapeutic Outcomes (FOTO)  44% limitation   AROM   AROM Assessment Site Shoulder   Right/Left Shoulder Right   Right Shoulder Extension 60 Degrees   Right Shoulder Flexion 170 Degrees   Right Shoulder ABduction 166 Degrees   Right Shoulder Internal Rotation 35 Degrees   Right Shoulder External Rotation 80 Degrees   PROM   Right Shoulder External Rotation 90 Degrees   Strength   Strength Assessment Site Shoulder   Right/Left Shoulder Right   Right Shoulder Flexion 5/5   Right Shoulder Extension 5/5   Right Shoulder ABduction 5/5   Right Shoulder Internal Rotation 5/5   Right Shoulder External Rotation --  5-/5                     [facility] Adult PT Treatment/Exercise - 03/15/16 0001    Shoulder Exercises: Standing   Horizontal  ABduction Strengthening;Right;Theraband   Theraband Level (Shoulder Horizontal ABduction) Level 3 (Green)   Development worker, community;Theraband   Theraband Level (Shoulder External Rotation) Level 3 (Green)   Internal Rotation Strengthening;Right;Theraband   Theraband Level (Shoulder Internal Rotation) Level 2 (Red)   Other Standing Exercises rings both directions, one rep with 2# wt on wrist   Shoulder Exercises: ROM/Strengthening   UBE (Upper Arm Bike) L4x6' alt FWD/BWD   Shoulder Exercises: Stretch   Other Shoulder Stretches doorway stretch low, behind back stretch.    Shoulder Exercises: Body Blade   Flexion 30 seconds;5 reps  sitting   ABduction 45 seconds;2 reps  sitting   Modalities   Modalities Electrical Stimulation;Iontophoresis;Vasopneumatic   Film/video editor IFC   Electrical Stimulation Parameters to tolerance   Electrical Stimulation Goals Pain;Tone   Iontophoresis   Type of Iontophoresis Dexamethasone   Location Rt shoulder proxinmal mid deltoid area    Dose 1.0 cc   Time 8 hr stat patch   Vasopneumatic   Number Minutes Vasopneumatic  15 minutes   Vasopnuematic Location  Shoulder  Rt   Vasopneumatic Pressure Low   Vasopneumatic Temperature  3*                PT Education - 03/15/16 1352    Education provided Yes   Education Details encouraged contistent HEP    Person(s) Educated Patient   Methods Explanation   Comprehension Verbalized understanding          PT Short Term Goals - 02/29/16 1449    PT SHORT TERM GOAL #1   Title Independent with initial HEP ( 01/17/16)    Status Achieved   PT SHORT TERM GOAL #2   Title demo Rt shoulder PROM to [facility] ( 01/31/16)    Status Achieved   PT SHORT TERM GOAL #3   Title tolerate inital Rt shoulder strengthening exercise ( 01/31/16)    Status Achieved   PT SHORT TERM GOAL #4   Title improve FOTO =/< 50% limited ( 01/31/16)    Status Achieved           PT Long Term Goals - 03/15/16 1348    PT LONG TERM GOAL #1   Title Independent with advanced HEP ( 03/13/16)   Time 12   Period Weeks   Status Achieved   PT LONG TERM GOAL #2   Title demo Rt shoulder AROM WFL without pain to allow her to perform some quilting. ( 03/13/16)    Time 12   Period Weeks   Status Achieved   PT LONG TERM GOAL #3   Title demo Rt shoulder strength =/> 5-/5 to allow return to work ( 03/13/16)    Time 12   Period Weeks   Status Achieved   PT LONG TERM GOAL #4   Title report pain no greater than 2/10 with lifitng tasks ( 03/13/16)    Time 12   Period Weeks   Status Achieved   PT LONG TERM GOAL #5   Title improve FOTO =/< 39% limited ( 03/13/16)    Time 12   Period Weeks   Status Partially Met               Plan - 03/15/16 1349    Clinical Impression  Statement Continued improvement in shoulder. Pleased with her progress and feels that she is ready to return to work.  Confident in continuing HEP independently. Returns  to MD Monday 03/18/16.    Rehab Potential Good   PT Frequency 2x / week   PT Duration 3 weeks   PT Treatment/Interventions Ultrasound;Patient/family education;Passive range of motion;Cryotherapy;Dry needling;Electrical Stimulation;Moist Heat;Therapeutic exercise;Manual techniques;Vasopneumatic Device;Taping;Iontophoresis 83m/ml Dexamethasone   PT Next Visit Plan note to MD     Consulted and Agree with Plan of Care Patient      Patient will benefit from skilled therapeutic intervention in order to improve the following deficits and impairments:  Postural dysfunction, Decreased strength, Increased edema, Decreased scar mobility, Pain, Impaired UE functional use, Decreased range of motion  Visit Diagnosis: Muscle weakness (generalized)  Stiffness of right shoulder, not elsewhere classified  Pain in right shoulder  Abnormal posture     Problem List Patient Active Problem List   Diagnosis Date Noted  . Complete rotator cuff tear 12/01/2015  . Wheezing 04/07/2014  . Impaired fasting glucose 07/26/2013  . Essential hypertension, benign 07/26/2013  . Back pain 07/26/2013  . Knee pain, bilateral 01/10/2013  . [place] 01/10/2013  . Headache(784.0) 12/06/2012    Celyn PNilda SimmerPT, MPH  03/15/2016, 2:41 PM  [facility] [address]Phone: [phone]  Fax:  [phone] Name: Megan KeppleMRN: [id number] of Birth: 109/15/1962   PHYSICAL THERAPY DISCHARGE SUMMARY  Visits from Start of Care: 24  Current functional level related to goals / functional outcomes: Excellent progress with rehab. See last progress note and note to MD for discharge status.   Remaining deficits: Needs to continue to work on ROM and  sConservator, museum/gallery/ Equipment: HEP   Plan: Patient agrees to discharge.  Patient goals were met. Patient is being discharged due to meeting the stated rehab goals.  ?????   Celyn P. HHelene KelpPT, MPH 05/01/16 3:36 PM

## 2016-05-23 ENCOUNTER — Ambulatory Visit: Payer: Self-pay | Admitting: Orthopedic Surgery

## 2016-05-23 MED ORDER — CLINDAMYCIN PHOSPHATE 600 MG/4ML IJ SOLN
900.0000 mg | INTRAMUSCULAR | Status: AC
  Administered 2016-06-13: 900 mg via INTRAVENOUS

## 2016-05-23 NOTE — Progress Notes (Signed)
Pt is being scheduled for preop appt; please place surgical orders in epic. Thanks.  

## 2016-05-31 ENCOUNTER — Encounter (HOSPITAL_COMMUNITY): Payer: Self-pay

## 2016-06-01 LAB — HM HEPATITIS C SCREENING LAB: HM Hepatitis Screen: NEGATIVE

## 2016-06-05 ENCOUNTER — Ambulatory Visit: Payer: Self-pay | Admitting: Orthopedic Surgery

## 2016-06-05 NOTE — H&P (Signed)
Megan Petersen DOB: 1960-11-17 Married / Language: English / Race: White Female  H&P Date: 06/05/16  Chief Complaint: R knee pain  History of Present Illness The patient is a 55 year old female who comes in today for a preoperative History and Physical. The patient is scheduled for a right total knee arthroplasty to be performed by Dr. Javier DockerJeffrey C. Beane, [facility] June 13, 2016. [name] reports chronic pain in the right knee progressively worsening for years, refractory to conservative tx including NSAIDs, activity modifications, quad strengthening, bracing, injections. The pain is interfering with ADLs and quality of life at this point and she desires to proceed with surgery.  Dr. Shelle IronBeane and the patient mutually agreed to proceed with a total knee replacement. Risks and benefits of the procedure were discussed including stiffness, suboptimal range of motion, persistent pain, infection requiring removal of prosthesis and reinsertion, need for prophylactic antibiotics in the future, for example, dental procedures, possible need for manipulation, revision in the future and also anesthetic complications including DVT, PE, etc. We discussed the perioperative course, time in the hospital, postoperative recovery and the need for elevation to control swelling. We also discussed the predicted range of motion and the probability that squatting and kneeling would be unobtainable in the future. In addition, postoperative anticoagulation was discussed. We have obtained preoperative medical clearance as necessary. Provided her illustrated handout and discussed it in detail. They will enroll in the total joint replacement educational forum at the hospital.  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)  High blood pressure  Asthma  Migraine Headache  Acute medial meniscus tear of right knee, initial encounter (B14.782N(S83.241A)  Pain in limb (M79.609) [04/13/2010]: Impaired Vision  Vertigo  in past Pneumonia  in past S/P arthroscopy of knee (V45.89)  (Marked as Inactive) Radiculitis, Thoracic or Lumbar (724.4)  (Marked as Inactive) 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)  (Marked as Inactive) Sprain of calcaneofibular ligament (845.02)  (Marked as Inactive) Disorder Articular Cartilage, Ankle/Foot (718.07)  (Marked as Inactive) Gastroesophageal Reflux Disease  Hemorrhoids  Gallbladder Problems  Anemia  post-vaginal delivery hemorrhaged requiring 4 units Measles   Allergies DEMEROL [10/24/2009]: N/V SHELLFISH [10/24/2009]: LYRICA [10/24/2009]: blisters CODEINE [10/24/2009]: hives Betadine *ANTISEPTICS & DISINFECTANTS*  ChloraPrep One Step *ANTISEPTICS & DISINFECTANTS*  rash Erythromycin Ethylsuccinate *MACROLIDES*  Erythromycin *MACROLIDES*  GI issues Allergies Reconciled   Family History  Congestive Heart Failure  mother Diabetes Mellitus  sister and brother First Degree Relatives  Cancer  mother and father Heart disease in female family member before age 56  Hypertension  mother, sister and brother Osteoarthritis  sister Siblings  brother, sister and niece with DVTs  Social History  Tobacco use  Never smoker. 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 Ultram (50MG  Tablet, Oral) Active. Singulair (10MG  Tablet, Oral) Active. Furosemide (40MG  Tablet, Oral) Active. Indomethacin CR (75MG  Capsule ER, Oral) Active. Symbicort (160-4.5MCG/ACT Aerosol, Inhalation) Active. Voltaren (1% Gel, Transdermal) Active. Lidoderm (5% Patch, External) Active. (prn) NexIUM (Oral) Specific strength unknown - Active. CloNIDine HCl (0.1MG  Tablet, Oral) Active. Vitamin D (1000UNIT Capsule, Oral) Active. Tart Cherry Advanced (Oral) Active. Turmeric (450MG  Capsule, Oral) Active. Acidophilus (Oral) Active. Colace (100MG  Capsule, Oral) Active. Glucosamine Chondr 1500 Complx (Oral) Specific strength unknown - Active. Medications Reconciled  Pregnancy / Birth History Pregnant  no  Past Surgical History Gallbladder Surgery  open, 1989 Rotator Cuff Repair - Right  2017 Dr Shelle Iron Arthroscopic Knee Surgery - Right  2013  Review of Systems General Not Present- Chills, Fatigue, Fever, Memory Loss, Night Sweats, Weight Gain and Weight Loss. Skin Not Present- Eczema, Hives, Itching, Lesions and Rash. HEENT Present- Dentures and Headache. Not Present- Double Vision, Hearing Loss, Tinnitus and Visual Loss. Respiratory Present- Allergies. Not Present- Chronic Cough, Coughing up blood, Shortness of breath at rest and Shortness of breath with exertion. Cardiovascular Not Present- Chest Pain, Difficulty Breathing Lying Down, Murmur, Palpitations, Racing/skipping heartbeats and Swelling. Gastrointestinal Not Present-  Abdominal Pain, Bloody Stool, Constipation, Diarrhea, Difficulty Swallowing, Heartburn, Jaundice, Loss of appetitie, Nausea and Vomiting. Female Genitourinary Not Present- Blood in Urine, Discharge, Flank Pain, Incontinence, Painful Urination, Urgency, Urinary frequency, Urinary Retention, Urinating at Night and Weak urinary stream. Musculoskeletal Present- Back Pain, Joint Pain, Joint Swelling, Morning Stiffness, Muscle Pain and Spasms. Not Present- Muscle Weakness. Neurological Not Present- Blackout spells, Difficulty with balance, Dizziness, Paralysis, Tremor and Weakness. Psychiatric Not Present- Insomnia.  Physical Exam General Mental Status -Alert, cooperative and good historian. General Appearance-pleasant, Not in acute distress. Orientation-Oriented X3. Build & Nutrition-Well nourished and Well developed.  Head and Neck Head-normocephalic, atraumatic . Neck Global Assessment - supple, no bruit auscultated on the right, no bruit auscultated on the left.  Eye Pupil - Bilateral-Regular and Round. Motion - Bilateral-EOMI.  Chest and Lung Exam Auscultation Breath sounds - clear at anterior chest wall and clear at posterior chest wall. Adventitious sounds - No Adventitious sounds.  Cardiovascular Auscultation Rhythm - Regular rate and rhythm. Heart Sounds - S1 WNL and S2 WNL. Murmurs & Other Heart Sounds - Auscultation of the heart reveals - No Murmurs.  Abdomen Palpation/Percussion Tenderness - Abdomen is non-tender to palpation. Rigidity (guarding) - Abdomen is soft. Auscultation Auscultation of the abdomen reveals - Bowel sounds normal.  Female Genitourinary Not done, not pertinent to present illness  Musculoskeletal On exam, she has severe pain with palpation of medial joint line, patellofemoral joint line. Range is 0 to 90.  Equivocal [place]. No DVT. Ipsilateral hip and ankle exam is unremarkable.  RADIOGRAPHS AP, standing and lateral demonstrates  bone-on-bone arthrosis, medial compartment and of the patellofemoral joint. MRI demonstrates no lateral meniscus tear.  Assessment & Plan Primary osteoarthritis of right knee (M17.11)  Pt with end-stage Right knee DJD, bone-on-bone, refractory to conservative tx, scheduled for Right total knee replacement by Dr. Shelle Iron on 06/13/16. We again discussed the procedure itself as well as risks, complications and alternatives, including but not limited to DVT, PE, infx, bleeding, failure of procedure, need  for secondary procedure including manipulation, nerve injury, ongoing pain/symptoms, anesthesia risk, even stroke or death. Also discussed typical post-op protocols, activity restrictions, need for PT, flexion/extension exercises, time out of work. Discussed need for DVT ppx post-op with Xarelto then ASA per protocol. Discussed dental ppx. Also discussed limitations post-operatively such as kneeling and squatting. All questions were answered. Patient desires to proceed with surgery as scheduled. Will hold supplements, ASA and NSAIDs accordingly. Will remain NPO after MN night before surgery. [facility] present to [facility] for pre-op testing. Plan Xarelto 2 weeks post-op for DVT ppx then ASA. Plan Percocet, Robaxin, Colace, Miralax. Plan home with HHPT post-op with family members at home for assistance. Will follow up 10-14 days post-op for suture/staple removal and xrays.  Plan Right total knee replacement  Signed electronically by Dorothy Spark, PA-C for Dr. Shelle Iron

## 2016-06-05 NOTE — Patient Instructions (Addendum)
Rinaldo RatelRhonda R Neenan  06/05/2016   Your procedure is scheduled on: 10-19- 17  [facility]  [facility]  [facility] to 3rd floor to  Short Stay Center at   0515 AM.  Call this number if you have problems the morning of surgery [phone]   Remember: ONLY 1 PERSON MAY GO WITH YOU TO SHORT STAY TO GET  READY MORNING OF YOUR SURGERY.  Do not eat food or drink liquids :After Midnight.     Take these medicines the morning of surgery with A SIP OF WATER: Clonidine. Nexium. Use Inhalers-usual. DO NOT TAKE ANY DIABETIC MEDICATIONS DAY OF YOUR SURGERY                               You may not have any metal on your body including hair pins and              piercings  Do not wear jewelry, make-up, lotions, powders or perfumes, deodorant             Do not wear nail polish.  Do not shave  48 hours prior to surgery.              Men may shave face and neck.   Do not bring valuables to the hospital. [facility] IS NOT             RESPONSIBLE   FOR VALUABLES.  Contacts, dentures or bridgework may not be worn into surgery.  Leave suitcase in the car. After surgery it may be brought to your room.     Patients discharged the day of surgery will not be allowed to drive home.  Name and phone number of your driver: Luisa Hartatrick- [phone] cell  Special Instructions: N/A              Please read over the following fact sheets you were given: _____________________________________________________________________             [facility] - Pre[facility] Before surgery, you can play an important role.  Because skin is not sterile, your skin needs to be as free of germs as possible.  You can reduce the number of germs on your skin by washing with CHG (chlorahexidine gluconate) soap before surgery.  CHG is an antiseptic cleaner which kills germs and bonds with the skin to continue killing germs even after washing. Please DO NOT use if you have an allergy to  CHG or antibacterial soaps.  If your skin becomes reddened/irritated stop using the CHG and inform your nurse when you arrive at Short Stay. Do not shave (including legs and underarms) for at least 48 hours prior to the first CHG shower.  You may shave your face/neck. Please follow these instructions carefully:  1.  Shower with CHG Soap the night before surgery and the  morning of Surgery.  2.  If you choose to wash your hair, wash your hair first as usual with your  normal  shampoo.  3.  After you shampoo, rinse your hair and body thoroughly to remove the  shampoo.                           4.  Use CHG as you would any other liquid soap.  You can apply chg  directly  to the skin and wash                       Gently with a scrungie or clean washcloth.  5.  Apply the CHG Soap to your body ONLY FROM THE NECK DOWN.   Do not use on face/ open                           Wound or open sores. Avoid contact with eyes, ears mouth and genitals (private parts).                       Wash face,  Genitals (private parts) with your normal soap.             6.  Wash thoroughly, paying special attention to the area where your surgery  will be performed.  7.  Thoroughly rinse your body with warm water from the neck down.  8.  DO NOT shower/wash with your normal soap after using and rinsing off  the CHG Soap.                9.  Pat yourself dry with a clean towel.            10.  Wear clean pajamas.            11.  Place clean sheets on your bed the night of your first shower and do not  sleep with pets. Day of Surgery : Do not apply any lotions/deodorants the morning of surgery.  Please wear clean clothes to the hospital/surgery center.  FAILURE TO FOLLOW THESE INSTRUCTIONS MAY RESULT IN THE CANCELLATION OF YOUR SURGERY PATIENT SIGNATURE_________________________________  NURSE SIGNATURE__________________________________  ________________________________________________________________________   Adam Phenix  An incentive spirometer is a tool that can help keep your lungs clear and active. This tool measures how well you are filling your lungs with each breath. Taking long deep breaths may help reverse or decrease the chance of developing breathing (pulmonary) problems (especially infection) following:  A long period of time when you are unable to move or be active. BEFORE THE PROCEDURE   If the spirometer includes an indicator to show your best effort, your nurse or respiratory therapist will set it to a desired goal.  If possible, sit up straight or lean slightly forward. Try not to slouch.  Hold the incentive spirometer in an upright position. INSTRUCTIONS FOR USE  1. Sit on the edge of your bed if possible, or sit up as far as you can in bed or on a chair. 2. Hold the incentive spirometer in an upright position. 3. Breathe out normally. 4. Place the mouthpiece in your mouth and seal your lips tightly around it. 5. Breathe in slowly and as deeply as possible, raising the piston or the ball toward the top of the column. 6. Hold your breath for 3-5 seconds or for as long as possible. Allow the piston or ball to fall to the bottom of the column. 7. Remove the mouthpiece from your mouth and breathe out normally. 8. Rest for a few seconds and repeat Steps 1 through 7 at least 10 times every 1-2 hours when you are awake. Take your time and take a few normal breaths between deep breaths. 9. The spirometer may include an indicator to show your best effort. Use the indicator as a goal to work toward during each repetition. 10.  After each set of 10 deep breaths, practice coughing to be sure your lungs are clear. If you have an incision (the cut made at the time of surgery), support your incision when coughing by placing a pillow or rolled up towels firmly against it. Once you are able to get out of bed, walk around indoors and cough well. You may stop using the incentive spirometer when  instructed by your caregiver.  RISKS AND COMPLICATIONS  Take your time so you do not get dizzy or light-headed.  If you are in pain, you may need to take or ask for pain medication before doing incentive spirometry. It is harder to take a deep breath if you are having pain. AFTER USE  Rest and breathe slowly and easily.  It can be helpful to keep track of a log of your progress. Your caregiver can provide you with a simple table to help with this. If you are using the spirometer at home, follow these instructions: [place] IF:   You are having difficultly using the spirometer.  You have trouble using the spirometer as often as instructed.  Your pain medication is not giving enough relief while using the spirometer.  You develop fever of 100.5 F (38.1 C) or higher. SEEK IMMEDIATE MEDICAL CARE IF:   You cough up bloody sputum that had not been present before.  You develop fever of 102 F (38.9 C) or greater.  You develop worsening pain at or near the incision site. MAKE SURE YOU:   Understand these instructions.  Will watch your condition.  Will get help right away if you are not doing well or get worse. Document Released: 12/23/2006 Document Revised: 11/04/2011 Document Reviewed: 02/23/2007 [facility] Patient Information 2014 [place], [place].   ________________________________________________________________________

## 2016-06-06 ENCOUNTER — Encounter (HOSPITAL_COMMUNITY)
Admission: RE | Admit: 2016-06-06 | Discharge: 2016-06-06 | Disposition: A | Discharge status: Home / Self Care | Payer: BLUE CROSS/BLUE SHIELD | Source: Ambulatory Visit | Attending: Specialist | Admitting: Specialist

## 2016-06-06 ENCOUNTER — Encounter (HOSPITAL_COMMUNITY): Payer: Self-pay

## 2016-06-06 DIAGNOSIS — M1711 Unilateral primary osteoarthritis, right knee: Secondary | ICD-10-CM | POA: Insufficient documentation

## 2016-06-06 DIAGNOSIS — Z01812 Encounter for preprocedural laboratory examination: Secondary | ICD-10-CM | POA: Diagnosis present

## 2016-06-06 HISTORY — DX: Unspecified hemorrhoids: K64.9

## 2016-06-06 HISTORY — DX: Dizziness and giddiness: R42

## 2016-06-06 HISTORY — DX: Personal history of other medical treatment: Z92.89

## 2016-06-06 LAB — APTT: APTT: 30 s (ref 24–36)

## 2016-06-06 LAB — URINALYSIS, ROUTINE W REFLEX MICROSCOPIC
Bilirubin Urine: NEGATIVE
GLUCOSE, UA: NEGATIVE mg/dL
HGB URINE DIPSTICK: NEGATIVE
Ketones, ur: NEGATIVE mg/dL
Nitrite: NEGATIVE
PROTEIN: NEGATIVE mg/dL
SPECIFIC GRAVITY, URINE: 1.009 (ref 1.005–1.030)
pH: 7.5 (ref 5.0–8.0)

## 2016-06-06 LAB — PROTIME-INR
INR: 0.94
Prothrombin Time: 12.6 seconds (ref 11.4–15.2)

## 2016-06-06 LAB — URINE MICROSCOPIC-ADD ON: RBC / HPF: NONE SEEN RBC/hpf (ref 0–5)

## 2016-06-06 LAB — SURGICAL PCR SCREEN
MRSA, PCR: NEGATIVE
Staphylococcus aureus: NEGATIVE

## 2016-06-06 LAB — HCG, SERUM, QUALITATIVE: PREG SERUM: NEGATIVE

## 2016-06-06 NOTE — Pre-Procedure Instructions (Signed)
EKG/CXR 4'17 Epic. Labs 05-28-16 CBC/d,CMP, LP  With chart to be used.

## 2016-06-13 ENCOUNTER — Inpatient Hospital Stay (HOSPITAL_COMMUNITY): Payer: BLUE CROSS/BLUE SHIELD | Admitting: Registered Nurse

## 2016-06-13 ENCOUNTER — Inpatient Hospital Stay (HOSPITAL_COMMUNITY): Payer: BLUE CROSS/BLUE SHIELD

## 2016-06-13 ENCOUNTER — Encounter (HOSPITAL_COMMUNITY)
Admission: RE | Disposition: A | Discharge status: Home Health | Payer: Self-pay | Source: Ambulatory Visit | Attending: Specialist

## 2016-06-13 ENCOUNTER — Inpatient Hospital Stay (HOSPITAL_COMMUNITY)
Admission: RE | Admit: 2016-06-13 | Discharge: 2016-06-16 | DRG: 470 | Disposition: A | Discharge status: Home Health | Payer: BLUE CROSS/BLUE SHIELD | Source: Ambulatory Visit | Attending: Specialist | Admitting: Specialist

## 2016-06-13 ENCOUNTER — Encounter (HOSPITAL_COMMUNITY): Payer: Self-pay | Admitting: *Deleted

## 2016-06-13 DIAGNOSIS — I10 Essential (primary) hypertension: Secondary | ICD-10-CM | POA: Diagnosis present

## 2016-06-13 DIAGNOSIS — Z881 Allergy status to other antibiotic agents status: Secondary | ICD-10-CM | POA: Diagnosis not present

## 2016-06-13 DIAGNOSIS — Z8249 Family history of ischemic heart disease and other diseases of the circulatory system: Secondary | ICD-10-CM

## 2016-06-13 DIAGNOSIS — E785 Hyperlipidemia, unspecified: Secondary | ICD-10-CM | POA: Diagnosis present

## 2016-06-13 DIAGNOSIS — Z886 Allergy status to analgesic agent status: Secondary | ICD-10-CM

## 2016-06-13 DIAGNOSIS — Z91013 Allergy to seafood: Secondary | ICD-10-CM | POA: Diagnosis not present

## 2016-06-13 DIAGNOSIS — G500 Trigeminal neuralgia: Secondary | ICD-10-CM | POA: Diagnosis present

## 2016-06-13 DIAGNOSIS — Z79891 Long term (current) use of opiate analgesic: Secondary | ICD-10-CM

## 2016-06-13 DIAGNOSIS — Z96659 Presence of unspecified artificial knee joint: Secondary | ICD-10-CM

## 2016-06-13 DIAGNOSIS — Z79899 Other long term (current) drug therapy: Secondary | ICD-10-CM | POA: Diagnosis not present

## 2016-06-13 DIAGNOSIS — M25561 Pain in right knee: Secondary | ICD-10-CM | POA: Diagnosis present

## 2016-06-13 DIAGNOSIS — Z885 Allergy status to narcotic agent status: Secondary | ICD-10-CM

## 2016-06-13 DIAGNOSIS — Z7951 Long term (current) use of inhaled steroids: Secondary | ICD-10-CM | POA: Diagnosis not present

## 2016-06-13 DIAGNOSIS — Z8261 Family history of arthritis: Secondary | ICD-10-CM | POA: Diagnosis not present

## 2016-06-13 DIAGNOSIS — M7989 Other specified soft tissue disorders: Secondary | ICD-10-CM

## 2016-06-13 DIAGNOSIS — M5137 Other intervertebral disc degeneration, lumbosacral region: Secondary | ICD-10-CM | POA: Diagnosis present

## 2016-06-13 DIAGNOSIS — Z91048 Other nonmedicinal substance allergy status: Secondary | ICD-10-CM | POA: Diagnosis not present

## 2016-06-13 DIAGNOSIS — K219 Gastro-esophageal reflux disease without esophagitis: Secondary | ICD-10-CM | POA: Diagnosis present

## 2016-06-13 DIAGNOSIS — Z87891 Personal history of nicotine dependence: Secondary | ICD-10-CM

## 2016-06-13 DIAGNOSIS — J45909 Unspecified asthma, uncomplicated: Secondary | ICD-10-CM | POA: Diagnosis present

## 2016-06-13 DIAGNOSIS — M1712 Unilateral primary osteoarthritis, left knee: Secondary | ICD-10-CM | POA: Diagnosis present

## 2016-06-13 DIAGNOSIS — M25761 Osteophyte, right knee: Secondary | ICD-10-CM | POA: Diagnosis present

## 2016-06-13 DIAGNOSIS — Z888 Allergy status to other drugs, medicaments and biological substances status: Secondary | ICD-10-CM | POA: Diagnosis not present

## 2016-06-13 DIAGNOSIS — R110 Nausea: Secondary | ICD-10-CM | POA: Diagnosis not present

## 2016-06-13 DIAGNOSIS — M1711 Unilateral primary osteoarthritis, right knee: Principal | ICD-10-CM | POA: Diagnosis present

## 2016-06-13 DIAGNOSIS — M5126 Other intervertebral disc displacement, lumbar region: Secondary | ICD-10-CM | POA: Diagnosis present

## 2016-06-13 DIAGNOSIS — Z6841 Body Mass Index (BMI) 40.0 and over, adult: Secondary | ICD-10-CM

## 2016-06-13 DIAGNOSIS — Z791 Long term (current) use of non-steroidal anti-inflammatories (NSAID): Secondary | ICD-10-CM

## 2016-06-13 DIAGNOSIS — Z96651 Presence of right artificial knee joint: Secondary | ICD-10-CM

## 2016-06-13 DIAGNOSIS — Z833 Family history of diabetes mellitus: Secondary | ICD-10-CM

## 2016-06-13 HISTORY — PX: TOTAL KNEE ARTHROPLASTY: SHX125

## 2016-06-13 LAB — TYPE AND SCREEN
ABO/RH(D): B NEG
Antibody Screen: NEGATIVE

## 2016-06-13 SURGERY — ARTHROPLASTY, KNEE, TOTAL
Anesthesia: General | Site: Knee | Laterality: Right

## 2016-06-13 MED ORDER — DEXAMETHASONE SODIUM PHOSPHATE 10 MG/ML IJ SOLN
INTRAMUSCULAR | Status: DC | PRN
  Administered 2016-06-13: 10 mg via INTRAVENOUS

## 2016-06-13 MED ORDER — DOCUSATE SODIUM 100 MG PO CAPS: 100.0000 mg | ORAL_CAPSULE | Freq: Two times a day (BID) | ORAL | 1 refills | Status: DC | PRN

## 2016-06-13 MED ORDER — MIDAZOLAM HCL 2 MG/2ML IJ SOLN
INTRAMUSCULAR | Status: AC
  Filled 2016-06-13: qty 2

## 2016-06-13 MED ORDER — OXYCODONE HCL 5 MG PO TABS
5.0000 mg | ORAL_TABLET | ORAL | Status: DC | PRN
  Administered 2016-06-13 (×2): 5 mg via ORAL
  Administered 2016-06-13 – 2016-06-16 (×15): 10 mg via ORAL
  Filled 2016-06-13: qty 1
  Filled 2016-06-13 (×4): qty 2
  Filled 2016-06-13: qty 1
  Filled 2016-06-13 (×11): qty 2

## 2016-06-13 MED ORDER — KETOROLAC TROMETHAMINE 30 MG/ML IJ SOLN
INTRAMUSCULAR | Status: AC
  Filled 2016-06-13: qty 1

## 2016-06-13 MED ORDER — MONTELUKAST SODIUM 10 MG PO TABS
10.0000 mg | ORAL_TABLET | Freq: Every day | ORAL | Status: DC
  Administered 2016-06-13 – 2016-06-15 (×3): 10 mg via ORAL
  Filled 2016-06-13 (×3): qty 1

## 2016-06-13 MED ORDER — HYDROMORPHONE HCL 2 MG/ML IJ SOLN
INTRAMUSCULAR | Status: AC
  Filled 2016-06-13: qty 1

## 2016-06-13 MED ORDER — ONDANSETRON HCL 4 MG/2ML IJ SOLN
INTRAMUSCULAR | Status: AC
  Filled 2016-06-13: qty 2

## 2016-06-13 MED ORDER — STERILE WATER FOR IRRIGATION IR SOLN
Status: DC | PRN
  Administered 2016-06-13: 2000 mL

## 2016-06-13 MED ORDER — LIDOCAINE 2% (20 MG/ML) 5 ML SYRINGE
INTRAMUSCULAR | Status: AC
  Filled 2016-06-13: qty 5

## 2016-06-13 MED ORDER — BUPIVACAINE HCL 0.25 % IJ SOLN
INTRAMUSCULAR | Status: AC
  Filled 2016-06-13: qty 1

## 2016-06-13 MED ORDER — FENTANYL CITRATE (PF) 100 MCG/2ML IJ SOLN
INTRAMUSCULAR | Status: AC
  Filled 2016-06-13: qty 2

## 2016-06-13 MED ORDER — IPRATROPIUM-ALBUTEROL 0.5-2.5 (3) MG/3ML IN SOLN: 3.0000 mL | Freq: Four times a day (QID) | RESPIRATORY_TRACT | Status: DC | PRN

## 2016-06-13 MED ORDER — CLINDAMYCIN PHOSPHATE 900 MG/50ML IV SOLN
INTRAVENOUS | Status: AC
  Filled 2016-06-13: qty 50

## 2016-06-13 MED ORDER — POLYETHYLENE GLYCOL 3350 17 G PO PACK: 17.0000 g | PACK | Freq: Every day | ORAL | Status: DC | PRN

## 2016-06-13 MED ORDER — ROCURONIUM BROMIDE 50 MG/5ML IV SOSY
PREFILLED_SYRINGE | INTRAVENOUS | Status: AC
  Filled 2016-06-13: qty 5

## 2016-06-13 MED ORDER — METOCLOPRAMIDE HCL 5 MG/ML IJ SOLN: 10.0000 mg | Freq: Once | INTRAMUSCULAR | Status: DC | PRN

## 2016-06-13 MED ORDER — FENTANYL CITRATE (PF) 100 MCG/2ML IJ SOLN
INTRAMUSCULAR | Status: DC | PRN
  Administered 2016-06-13: 50 ug via INTRAVENOUS
  Administered 2016-06-13: 100 ug via INTRAVENOUS
  Administered 2016-06-13: 50 ug via INTRAVENOUS

## 2016-06-13 MED ORDER — PHENOL 1.4 % MT LIQD
1.0000 | OROMUCOSAL | Status: DC | PRN
  Filled 2016-06-13: qty 177

## 2016-06-13 MED ORDER — SODIUM CHLORIDE 0.9 % IR SOLN
Status: AC
  Filled 2016-06-13: qty 500000

## 2016-06-13 MED ORDER — GUAIFENESIN ER 600 MG PO TB12: 600.0000 mg | ORAL_TABLET | ORAL | Status: DC | PRN

## 2016-06-13 MED ORDER — CLONIDINE HCL 0.1 MG PO TABS
0.1000 mg | ORAL_TABLET | Freq: Every morning | ORAL | Status: DC
  Administered 2016-06-14 – 2016-06-15 (×2): 0.1 mg via ORAL
  Filled 2016-06-13 (×2): qty 1

## 2016-06-13 MED ORDER — METOCLOPRAMIDE HCL 5 MG PO TABS: 5.0000 mg | ORAL_TABLET | Freq: Three times a day (TID) | ORAL | Status: DC | PRN

## 2016-06-13 MED ORDER — POLYETHYLENE GLYCOL 3350 17 G PO PACK: 17.0000 g | PACK | Freq: Every day | ORAL | 0 refills | Status: DC

## 2016-06-13 MED ORDER — IRBESARTAN 150 MG PO TABS
150.0000 mg | ORAL_TABLET | Freq: Every day | ORAL | Status: DC
  Administered 2016-06-14 – 2016-06-15 (×2): 150 mg via ORAL
  Filled 2016-06-13 (×2): qty 1

## 2016-06-13 MED ORDER — PROPOFOL 10 MG/ML IV BOLUS
INTRAVENOUS | Status: DC | PRN
  Administered 2016-06-13: 250 mg via INTRAVENOUS

## 2016-06-13 MED ORDER — HYDROMORPHONE HCL 1 MG/ML IJ SOLN
1.0000 mg | INTRAMUSCULAR | Status: DC | PRN
  Administered 2016-06-13 – 2016-06-14 (×6): 1 mg via INTRAVENOUS
  Filled 2016-06-13 (×6): qty 1

## 2016-06-13 MED ORDER — CEFAZOLIN SODIUM-DEXTROSE 2-4 GM/100ML-% IV SOLN
2.0000 g | INTRAVENOUS | Status: AC
  Administered 2016-06-13: 2 g via INTRAVENOUS

## 2016-06-13 MED ORDER — ALBUTEROL SULFATE (2.5 MG/3ML) 0.083% IN NEBU: 2.5000 mg | INHALATION_SOLUTION | Freq: Four times a day (QID) | RESPIRATORY_TRACT | Status: DC | PRN

## 2016-06-13 MED ORDER — DIPHENHYDRAMINE HCL 12.5 MG/5ML PO ELIX
12.5000 mg | ORAL_SOLUTION | ORAL | Status: DC | PRN
Start: 2016-06-13 — End: 2016-06-16

## 2016-06-13 MED ORDER — RIVAROXABAN 10 MG PO TABS: 10.0000 mg | ORAL_TABLET | Freq: Every day | ORAL | 0 refills | Status: DC

## 2016-06-13 MED ORDER — MIDAZOLAM HCL 5 MG/5ML IJ SOLN
INTRAMUSCULAR | Status: DC | PRN
  Administered 2016-06-13: 2 mg via INTRAVENOUS

## 2016-06-13 MED ORDER — FUROSEMIDE 40 MG PO TABS
40.0000 mg | ORAL_TABLET | Freq: Every day | ORAL | Status: DC
  Administered 2016-06-15: 40 mg via ORAL
  Filled 2016-06-13 (×2): qty 1

## 2016-06-13 MED ORDER — PROPOFOL 10 MG/ML IV BOLUS
INTRAVENOUS | Status: AC
  Filled 2016-06-13: qty 40

## 2016-06-13 MED ORDER — PROMETHAZINE HCL 25 MG/ML IJ SOLN
6.2500 mg | Freq: Four times a day (QID) | INTRAMUSCULAR | Status: DC | PRN
  Administered 2016-06-13: 6.25 mg via INTRAVENOUS
  Filled 2016-06-13: qty 1

## 2016-06-13 MED ORDER — ACETAMINOPHEN 650 MG RE SUPP: 650.0000 mg | Freq: Four times a day (QID) | RECTAL | Status: DC | PRN

## 2016-06-13 MED ORDER — ACETAMINOPHEN 325 MG PO TABS: 650.0000 mg | ORAL_TABLET | Freq: Four times a day (QID) | ORAL | Status: DC | PRN

## 2016-06-13 MED ORDER — RIVAROXABAN 10 MG PO TABS
10.0000 mg | ORAL_TABLET | Freq: Every day | ORAL | Status: DC
  Administered 2016-06-14 – 2016-06-16 (×3): 10 mg via ORAL
  Filled 2016-06-13 (×3): qty 1

## 2016-06-13 MED ORDER — KCL IN DEXTROSE-NACL 20-5-0.45 MEQ/L-%-% IV SOLN
INTRAVENOUS | Status: AC
  Administered 2016-06-13: 50 mL/h via INTRAVENOUS
  Filled 2016-06-13 (×2): qty 1000

## 2016-06-13 MED ORDER — HYDROMORPHONE HCL 1 MG/ML IJ SOLN
INTRAMUSCULAR | Status: DC | PRN
  Administered 2016-06-13 (×4): 0.5 mg via INTRAVENOUS

## 2016-06-13 MED ORDER — ONDANSETRON HCL 4 MG/2ML IJ SOLN
4.0000 mg | Freq: Four times a day (QID) | INTRAMUSCULAR | Status: DC | PRN
  Administered 2016-06-13 – 2016-06-14 (×2): 4 mg via INTRAVENOUS
  Filled 2016-06-13 (×2): qty 2

## 2016-06-13 MED ORDER — METOCLOPRAMIDE HCL 5 MG/ML IJ SOLN
5.0000 mg | Freq: Three times a day (TID) | INTRAMUSCULAR | Status: DC | PRN
  Administered 2016-06-13: 10 mg via INTRAVENOUS
  Filled 2016-06-13: qty 2

## 2016-06-13 MED ORDER — HYDROMORPHONE HCL 1 MG/ML IJ SOLN
INTRAMUSCULAR | Status: AC
  Administered 2016-06-14: 1 mg via INTRAVENOUS
  Filled 2016-06-13: qty 1

## 2016-06-13 MED ORDER — IPRATROPIUM-ALBUTEROL 20-100 MCG/ACT IN AERS: 1.0000 | INHALATION_SPRAY | Freq: Four times a day (QID) | RESPIRATORY_TRACT | Status: DC | PRN

## 2016-06-13 MED ORDER — PANTOPRAZOLE SODIUM 40 MG PO TBEC
80.0000 mg | DELAYED_RELEASE_TABLET | Freq: Every day | ORAL | Status: DC
  Administered 2016-06-14 – 2016-06-15 (×2): 80 mg via ORAL
  Filled 2016-06-13 (×2): qty 2

## 2016-06-13 MED ORDER — SODIUM CHLORIDE 0.9 % IR SOLN
Status: DC | PRN
  Administered 2016-06-13: 500 mL

## 2016-06-13 MED ORDER — LACTATED RINGERS IV SOLN
INTRAVENOUS | Status: DC
  Administered 2016-06-13: 07:00:00 via INTRAVENOUS

## 2016-06-13 MED ORDER — SUGAMMADEX SODIUM 200 MG/2ML IV SOLN
INTRAVENOUS | Status: AC
  Filled 2016-06-13: qty 2

## 2016-06-13 MED ORDER — TRANEXAMIC ACID 1000 MG/10ML IV SOLN
1000.0000 mg | INTRAVENOUS | Status: DC
  Filled 2016-06-13: qty 10

## 2016-06-13 MED ORDER — ONDANSETRON HCL 4 MG PO TABS
4.0000 mg | ORAL_TABLET | Freq: Four times a day (QID) | ORAL | Status: DC | PRN
  Administered 2016-06-14: 4 mg via ORAL
  Filled 2016-06-13: qty 1

## 2016-06-13 MED ORDER — METHOCARBAMOL 500 MG PO TABS: 500.0000 mg | ORAL_TABLET | Freq: Four times a day (QID) | ORAL | 1 refills | Status: DC | PRN

## 2016-06-13 MED ORDER — DEXAMETHASONE SODIUM PHOSPHATE 10 MG/ML IJ SOLN
INTRAMUSCULAR | Status: AC
  Filled 2016-06-13: qty 1

## 2016-06-13 MED ORDER — ALUM & MAG HYDROXIDE-SIMETH 200-200-20 MG/5ML PO SUSP: 30.0000 mL | ORAL | Status: DC | PRN

## 2016-06-13 MED ORDER — SUGAMMADEX SODIUM 200 MG/2ML IV SOLN
INTRAVENOUS | Status: DC | PRN
  Administered 2016-06-13: 200 mg via INTRAVENOUS

## 2016-06-13 MED ORDER — LIDOCAINE HCL (CARDIAC) 20 MG/ML IV SOLN
INTRAVENOUS | Status: DC | PRN
  Administered 2016-06-13: 100 mg via INTRAVENOUS

## 2016-06-13 MED ORDER — MOMETASONE FURO-FORMOTEROL FUM 200-5 MCG/ACT IN AERO
2.0000 | INHALATION_SPRAY | Freq: Two times a day (BID) | RESPIRATORY_TRACT | Status: DC
  Administered 2016-06-14 – 2016-06-16 (×5): 2 via RESPIRATORY_TRACT
  Filled 2016-06-13: qty 8.8

## 2016-06-13 MED ORDER — SODIUM CHLORIDE 0.9 % IR SOLN
Status: DC | PRN
Start: 2016-06-13 — End: 2016-06-13
  Administered 2016-06-13: 2000 mL

## 2016-06-13 MED ORDER — LACTATED RINGERS IV SOLN: INTRAVENOUS | Status: DC

## 2016-06-13 MED ORDER — BUPIVACAINE HCL (PF) 0.25 % IJ SOLN
INTRAMUSCULAR | Status: DC | PRN
  Administered 2016-06-13: 50 mL

## 2016-06-13 MED ORDER — HYDROMORPHONE HCL 1 MG/ML IJ SOLN
0.2500 mg | INTRAMUSCULAR | Status: DC | PRN
  Administered 2016-06-13: 0.5 mg via INTRAVENOUS
  Administered 2016-06-13: 0.25 mg via INTRAVENOUS
  Administered 2016-06-13: 0.5 mg via INTRAVENOUS

## 2016-06-13 MED ORDER — ALBUTEROL SULFATE HFA 108 (90 BASE) MCG/ACT IN AERS: 2.0000 | INHALATION_SPRAY | Freq: Four times a day (QID) | RESPIRATORY_TRACT | Status: DC | PRN

## 2016-06-13 MED ORDER — RISAQUAD PO CAPS
1.0000 | ORAL_CAPSULE | Freq: Every day | ORAL | Status: DC
  Administered 2016-06-14 – 2016-06-15 (×2): 1 via ORAL
  Filled 2016-06-13 (×2): qty 1

## 2016-06-13 MED ORDER — METHOCARBAMOL 1000 MG/10ML IJ SOLN
500.0000 mg | Freq: Four times a day (QID) | INTRAVENOUS | Status: DC | PRN
  Administered 2016-06-13: 500 mg via INTRAVENOUS
  Filled 2016-06-13: qty 550
  Filled 2016-06-13: qty 5

## 2016-06-13 MED ORDER — BISACODYL 5 MG PO TBEC: 5.0000 mg | DELAYED_RELEASE_TABLET | Freq: Every day | ORAL | Status: DC | PRN

## 2016-06-13 MED ORDER — ACETAMINOPHEN 10 MG/ML IV SOLN
INTRAVENOUS | Status: AC
  Administered 2016-06-14: 1000 mg via INTRAVENOUS
  Filled 2016-06-13: qty 100

## 2016-06-13 MED ORDER — ACETAMINOPHEN 10 MG/ML IV SOLN
1000.0000 mg | Freq: Four times a day (QID) | INTRAVENOUS | Status: DC
  Administered 2016-06-13: 1000 mg via INTRAVENOUS

## 2016-06-13 MED ORDER — FENTANYL CITRATE (PF) 100 MCG/2ML IJ SOLN
25.0000 ug | INTRAMUSCULAR | Status: DC | PRN
  Administered 2016-06-13 (×4): 50 ug via INTRAVENOUS

## 2016-06-13 MED ORDER — METHOCARBAMOL 500 MG PO TABS
500.0000 mg | ORAL_TABLET | Freq: Four times a day (QID) | ORAL | Status: DC | PRN
  Administered 2016-06-13 – 2016-06-14 (×3): 500 mg via ORAL
  Filled 2016-06-13 (×3): qty 1

## 2016-06-13 MED ORDER — TRAMADOL HCL 50 MG PO TABS
50.0000 mg | ORAL_TABLET | Freq: Four times a day (QID) | ORAL | Status: DC | PRN
  Administered 2016-06-13 – 2016-06-15 (×3): 50 mg via ORAL
  Filled 2016-06-13 (×3): qty 1

## 2016-06-13 MED ORDER — DOCUSATE SODIUM 100 MG PO CAPS
100.0000 mg | ORAL_CAPSULE | Freq: Two times a day (BID) | ORAL | Status: DC
  Administered 2016-06-13 – 2016-06-15 (×5): 100 mg via ORAL
  Filled 2016-06-13 (×5): qty 1

## 2016-06-13 MED ORDER — HYDROMORPHONE HCL 1 MG/ML IJ SOLN
INTRAMUSCULAR | Status: AC
  Administered 2016-06-13: 1 mg via INTRAVENOUS
  Filled 2016-06-13: qty 1

## 2016-06-13 MED ORDER — MAGNESIUM CITRATE PO SOLN: 1.0000 | Freq: Once | ORAL | Status: DC | PRN

## 2016-06-13 MED ORDER — ROCURONIUM BROMIDE 10 MG/ML (PF) SYRINGE
PREFILLED_SYRINGE | INTRAVENOUS | Status: DC | PRN
  Administered 2016-06-13: 10 mg via INTRAVENOUS
  Administered 2016-06-13: 20 mg via INTRAVENOUS
  Administered 2016-06-13: 50 mg via INTRAVENOUS

## 2016-06-13 MED ORDER — CEFAZOLIN SODIUM-DEXTROSE 2-4 GM/100ML-% IV SOLN
INTRAVENOUS | Status: AC
Start: 2016-06-13 — End: 2016-06-13
  Filled 2016-06-13: qty 100

## 2016-06-13 MED ORDER — OXYCODONE-ACETAMINOPHEN 10-325 MG PO TABS: 1.0000 | ORAL_TABLET | ORAL | 0 refills | Status: DC | PRN

## 2016-06-13 MED ORDER — MENTHOL 3 MG MT LOZG: 1.0000 | LOZENGE | OROMUCOSAL | Status: DC | PRN

## 2016-06-13 MED ORDER — CEFAZOLIN SODIUM-DEXTROSE 2-4 GM/100ML-% IV SOLN
2.0000 g | Freq: Four times a day (QID) | INTRAVENOUS | Status: AC
  Administered 2016-06-13 – 2016-06-14 (×3): 2 g via INTRAVENOUS
  Filled 2016-06-13 (×3): qty 100

## 2016-06-13 MED ORDER — SUCCINYLCHOLINE CHLORIDE 20 MG/ML IJ SOLN
INTRAMUSCULAR | Status: AC
  Filled 2016-06-13: qty 1

## 2016-06-13 MED ORDER — DOCUSATE SODIUM 100 MG PO CAPS: 100.0000 mg | ORAL_CAPSULE | Freq: Two times a day (BID) | ORAL | Status: DC | PRN

## 2016-06-13 SURGICAL SUPPLY — 67 items
BAG ZIPLOCK 12X15 (MISCELLANEOUS) ×3 IMPLANT
BANDAGE ACE 4X5 VEL STRL LF (GAUZE/BANDAGES/DRESSINGS) ×3 IMPLANT
BANDAGE ACE 6X5 VEL STRL LF (GAUZE/BANDAGES/DRESSINGS) ×3 IMPLANT
BLADE SAG 18X100X1.27 (BLADE) ×3 IMPLANT
BLADE SAW SGTL 13.0X1.19X90.0M (BLADE) ×3 IMPLANT
BNDG COHESIVE 1X5 TAN STRL LF (GAUZE/BANDAGES/DRESSINGS) ×3 IMPLANT
CAPT KNEE TOTAL 3 ATTUNE ×3 IMPLANT
CEMENT HV SMART SET (Cement) ×6 IMPLANT
CHLORAPREP W/TINT 26ML (MISCELLANEOUS) ×3 IMPLANT
CLOSURE WOUND 1/2 X4 (GAUZE/BANDAGES/DRESSINGS)
CLOTH 2% CHLOROHEXIDINE 3PK (PERSONAL CARE ITEMS) ×3 IMPLANT
CUFF TOURN SGL QUICK 34 (TOURNIQUET CUFF) ×2
CUFF TRNQT CYL 34X4X40X1 (TOURNIQUET CUFF) ×1 IMPLANT
DECANTER SPIKE VIAL GLASS SM (MISCELLANEOUS) ×3 IMPLANT
DRAPE INCISE 23X17 IOBAN STRL (DRAPES) ×4
DRAPE INCISE IOBAN 23X17 STRL (DRAPES) ×2 IMPLANT
DRAPE ORTHO SPLIT 77X108 STRL (DRAPES) ×4
DRAPE SHEET LG 3/4 BI-LAMINATE (DRAPES) ×6 IMPLANT
DRAPE SURG ORHT 6 SPLT 77X108 (DRAPES) ×2 IMPLANT
DRAPE U-SHAPE 47X51 STRL (DRAPES) ×3 IMPLANT
DRESSING AQUACEL AG SP 3.5X10 (GAUZE/BANDAGES/DRESSINGS) ×1 IMPLANT
DRSG AQUACEL AG ADV 3.5X10 (GAUZE/BANDAGES/DRESSINGS) IMPLANT
DRSG AQUACEL AG SP 3.5X10 (GAUZE/BANDAGES/DRESSINGS) ×3
DRSG TEGADERM 4X4.75 (GAUZE/BANDAGES/DRESSINGS) IMPLANT
ELECT REM PT RETURN 9FT ADLT (ELECTROSURGICAL) ×3
ELECTRODE REM PT RTRN 9FT ADLT (ELECTROSURGICAL) ×1 IMPLANT
EVACUATOR 1/8 PVC DRAIN (DRAIN) IMPLANT
GAUZE SPONGE 2X2 8PLY STRL LF (GAUZE/BANDAGES/DRESSINGS) IMPLANT
GLOVE BIOGEL PI IND STRL 7.0 (GLOVE) ×1 IMPLANT
GLOVE BIOGEL PI IND STRL 7.5 (GLOVE) ×4 IMPLANT
GLOVE BIOGEL PI IND STRL 8 (GLOVE) ×1 IMPLANT
GLOVE BIOGEL PI INDICATOR 7.0 (GLOVE) ×2
GLOVE BIOGEL PI INDICATOR 7.5 (GLOVE) ×8
GLOVE BIOGEL PI INDICATOR 8 (GLOVE) ×2
GLOVE SURG SS PI 7.0 STRL IVOR (GLOVE) ×3 IMPLANT
GLOVE SURG SS PI 7.5 STRL IVOR (GLOVE) ×9 IMPLANT
GLOVE SURG SS PI 8.0 STRL IVOR (GLOVE) ×6 IMPLANT
GOWN SPEC L3 XXLG W/TWL (GOWN DISPOSABLE) ×3 IMPLANT
GOWN STRL REUS W/ TWL XL LVL3 (GOWN DISPOSABLE) ×1 IMPLANT
GOWN STRL REUS W/TWL XL LVL3 (GOWN DISPOSABLE) ×8 IMPLANT
HANDPIECE INTERPULSE COAX TIP (DISPOSABLE) ×2
HEMOSTAT SPONGE AVITENE ULTRA (HEMOSTASIS) IMPLANT
IMMOBILIZER KNEE 20 (SOFTGOODS) ×3
IMMOBILIZER KNEE 20 THIGH 36 (SOFTGOODS) ×1 IMPLANT
MANIFOLD NEPTUNE II (INSTRUMENTS) ×3 IMPLANT
PACK TOTAL KNEE CUSTOM (KITS) ×3 IMPLANT
POSITIONER SURGICAL ARM (MISCELLANEOUS) ×3 IMPLANT
SET HNDPC FAN SPRY TIP SCT (DISPOSABLE) ×1 IMPLANT
SLEEVE SURGEON STRL (DRAPES) ×3 IMPLANT
SPONGE GAUZE 2X2 STER 10/PKG (GAUZE/BANDAGES/DRESSINGS)
SPONGE SURGIFOAM ABS GEL 100 (HEMOSTASIS) ×3 IMPLANT
STAPLER VISISTAT (STAPLE) ×3 IMPLANT
STRIP CLOSURE SKIN 1/2X4 (GAUZE/BANDAGES/DRESSINGS) IMPLANT
SUT BONE WAX W31G (SUTURE) ×3 IMPLANT
SUT MNCRL AB 4-0 PS2 18 (SUTURE) IMPLANT
SUT STRATAFIX 0 PDS 27 VIOLET (SUTURE) ×3
SUT VIC AB 1 CT1 27 (SUTURE) ×4
SUT VIC AB 1 CT1 27XBRD ANTBC (SUTURE) ×2 IMPLANT
SUT VIC AB 1 CT1 36 (SUTURE) ×3 IMPLANT
SUT VIC AB 2-0 CT1 27 (SUTURE) ×6
SUT VIC AB 2-0 CT1 TAPERPNT 27 (SUTURE) ×3 IMPLANT
SUTURE STRATFX 0 PDS 27 VIOLET (SUTURE) ×1 IMPLANT
SYR 50ML LL SCALE MARK (SYRINGE) ×3 IMPLANT
TOWER CARTRIDGE SMART MIX (DISPOSABLE) ×3 IMPLANT
TRAY FOLEY CATH SILVER 14FR (SET/KITS/TRAYS/PACK) ×3 IMPLANT
WRAP KNEE MAXI GEL POST OP (GAUZE/BANDAGES/DRESSINGS) ×3 IMPLANT
YANKAUER SUCT BULB TIP 10FT TU (MISCELLANEOUS) ×3 IMPLANT

## 2016-06-13 NOTE — Anesthesia Postprocedure Evaluation (Signed)
Anesthesia Post Note  Patient: Megan RatelRhonda R Petersen  Procedure(s) Performed: Procedure(s) (LRB): RIGHT TOTAL KNEE ARTHROPLASTY (Right)  Patient location during evaluation: PACU Anesthesia Type: General Level of consciousness: awake and alert Pain management: pain level controlled Vital Signs Assessment: post-procedure vital signs reviewed and stable Respiratory status: spontaneous breathing, nonlabored ventilation, respiratory function stable and patient connected to nasal cannula oxygen Cardiovascular status: blood pressure returned to baseline and stable Postop Assessment: no signs of nausea or vomiting Anesthetic complications: no    Last Vitals:  Vitals:   06/13/16 1145 06/13/16 1200  BP: (!) 162/67 (!) 151/72  Pulse: 90 91  Resp: 16 17  Temp:      Last Pain:  Vitals:   06/13/16 1200  TempSrc:   PainSc: 9         RLE Motor Response: Purposeful movement (06/13/16 1200) RLE Sensation: Full sensation (06/13/16 1200)      Phillips Groutarignan, Sakshi Sermons

## 2016-06-13 NOTE — Care Management Note (Signed)
Case Management Note  Patient Details  Name: PERLE BRICKHOUSE MRN: [id number] Date of Birth: [birth year]  Subjective/Objective:                  RIGHT TOTAL KNEE ARTHROPLASTY (Right) Action/Plan: Discharge planning Expected Discharge Date:                  Expected Discharge Plan:  [place]  In-House Referral:     Discharge planning Services  CM Consult  Post Acute Care Choice:  Home Health Choice offered to:  Patient  DME Arranged:  3-N-1, Walker rolling DME Agency:  [place]:  PT [place] Agency:  [facility] (formerly [facility])  Status of Service:  Completed, signed off  If discussed at [employer], dates discussed:    Additional Comments: CM met with pt in room to offer choice of home health agency.  Pt chooses [facility].  Referral given to [facility] rep, Tim.  CM notified [facility] DME rep, Reggie to please deliver the rolling walker and 3n1 to room prior to discharge.  No other CM needs were communicated. Dellie Catholic, RN 06/13/2016, 2:46 PM

## 2016-06-13 NOTE — Anesthesia Procedure Notes (Signed)
Procedure Name: Intubation Date/Time: 06/13/2016 7:38 AM Performed by: Jarvis NewcomerARMISTEAD, Edmund Rick A Pre-anesthesia Checklist: Patient identified, Timeout performed, Emergency Drugs available, Suction available and Patient being monitored Patient Re-evaluated:Patient Re-evaluated prior to inductionOxygen Delivery Method: Circle system utilized Preoxygenation: Pre-oxygenation with 100% oxygen Intubation Type: IV induction Ventilation: Mask ventilation without difficulty Laryngoscope Size: Mac and 4 Grade View: Grade I Tube type: Oral Tube size: 7.5 mm Number of attempts: 1 Airway Equipment and Method: Stylet Placement Confirmation: ETT inserted through vocal cords under direct vision,  positive ETCO2 and breath sounds checked- equal and bilateral Secured at: 19 cm Tube secured with: Tape Dental Injury: Teeth and Oropharynx as per pre-operative assessment

## 2016-06-13 NOTE — Transfer of Care (Signed)
Immediate Anesthesia Transfer of Care Note  Patient: Megan Petersen  Procedure(s) Performed: Procedure(s): RIGHT TOTAL KNEE ARTHROPLASTY (Right)  Patient Location: PACU  Anesthesia Type:General  Level of Consciousness: awake, alert , oriented and patient cooperative  Airway & Oxygen Therapy: Patient Spontanous Breathing and Patient connected to face mask oxygen  Post-op Assessment: Report given to RN, Post -op Vital signs reviewed and stable and Patient moving all extremities  Post vital signs: Reviewed and stable  Last Vitals:  Vitals:   06/13/16 0534  BP: (!) 156/82  Pulse: (!) 110  Resp: 18  Temp: 37.6 C    Last Pain:  Vitals:   06/13/16 0534  TempSrc: Oral      Patients Stated Pain Goal: 4 (06/13/16 0545)  Complications: No apparent anesthesia complications

## 2016-06-13 NOTE — Op Note (Signed)
Megan Petersen, Megan Petersen                ACCOUNT NO.:  [id number]  MEDICAL RECORD NO.:  [id number]  LOCATION:  [facility]                         FACILITY:  [facility]  PHYSICIAN:  Jene Every, M.D.    DATE OF BIRTH:  Nov 16, 1960  DATE OF PROCEDURE: DATE OF DISCHARGE:                              OPERATIVE REPORT   PREOPERATIVE DIAGNOSES: 1. Degenerative joint disease, end-stage osteoarthrosis of the right     knee. 2. Elevated BMI of 44.  POSTOPERATIVE DIAGNOSES: 1. Degenerative joint disease, end-stage osteoarthrosis of the right     knee. 2. Elevated BMI of 44.  PROCEDURE PERFORMED:  Right total knee arthroplasty.  COMPONENTS:  DePuy Attune 5 femur, 5 tibia, 6 insert, 35 patella.  ANESTHESIA:  General.  ASSISTANT:  Andrez Grime, P.A.  HISTORY:  A 54, end-stage osteoarthrosis, medial compartment of the right knee, varus deformity, refractory to rest activity modification, and arthroscopic debridement, viscosupplementation, exercise program with significant negative effect to her activities of daily living with severe pain and ambulatory dysfunction.  She was indicated due to bone- on-bone arthrosis of medial compartment and the patellofemoral joint for a total knee replacement.  Risk and benefits discussed including bleeding, infection, damage to neurovascular structures, DVT, PE, anesthetic complications.  No change in symptoms, worsening symptoms, etc.  TECHNIQUE:  With the patient in supine position, after induction of adequate anesthesia 2 g Kefzol, the right lower extremity was prepped, draped, and exsanguinated in usual sterile fashion.  Thigh tourniquet inflated to 275 mmHg.  Midline incision was made.  Full-thickness flaps developed.  Median parapatellar arthrotomy was performed.  Patella everted and knee flexed.  Tricompartmental osteoarthrosis was noted. Osteophytes removed with a rongeur.  Remnants of medial and lateral menisci were removed.  Elevated minimally soft  tissues medially.  ACL was removed.  Step drill was utilized to enter the femoral canal.  We had bone-on-bone arthrosis, medial compartment patellofemoral joint.  It was irrigated, 5 degree right was used.  It was pinned, and 9 of the distal femur was obtained.  Subluxed our tibia.  External alignment guide, in line with the first ray, bisecting the tibiotalar joint, parallel to the shaft, 3 degree slope, pinned throughout the defect which is medial.  This was pinned.  We performed our tibial cut.  Soft tissues protected at all times.  Increased exposure challenge due to her elevated BMI.  We checked our extension gap and that was satisfactory. We then turned towards completing the femur.  We sized it off the anterior cortex to be a 5.  This was pinned in 3 degrees of external rotation.  We performed anterior posterior and block cuts.  Soft tissue was protected at all times.  Block cut separate jig bisecting the canal. This was flushed to the lateral surface.  Following this, we completed the tibia, was subluxed, trialed and maximized to a 5, just the medial aspect of the tibial tubercle.  This was then pinned, centrally drilled, and a punch guide utilized.  We used a trial femur and tibia, and we had full extension, full flexion, good stability, varus and valgus stressing 0 to 30 degrees.  We turned our attention then towards the patella,  was measured at 23.  We planed to a 16 utilizing external alignment jig.  We drilled our peg holes medializing them and pedal parallel to the tibiotalar joint.  A trial patella was utilized.  It was reduced, and we had excellent patellofemoral tracking.  We drilled our peg holes for the femur.  We removed all trials, checked posteriorly, cauterized the geniculates.  Popliteus was intact.  Osteophytes removed with a Beyer rongeur.  Pulsatile lavage was used in the need to clean all surfaces. Knee was flexed.  Retractors were placed.  Tibia subluxed.   Thoroughly dried all surfaces.  Mixed cement on the back table in appropriate fashion.  We injected cement into the tibial canal digitally pressurizing it.  We impacted the 5 tibial tray, redundant cement removed.  We impacted the 5 femur.  Impacted redundant cement removed. We placed a trial 6 insert, reduced it and held an axial load in extension throughout the curing the cement.  We cemented the patellar complement and applied the clamp.  We then removed redundant cement, injected 0.25% Marcaine and epinephrine, periarticular tissues, bone wax on the cancellous surfaces.  After curing of the cement, tourniquet was deflated, and vessels were cauterized.  No significant active bleeding was noted.  We then flexed the knee, meticulously removed all redundant cement.  We removed our trial insert, checked posteriorly.  Any redundant cement removed, copiously irrigated the wound with antibiotic irrigation, placed a 6 mm insert, reduced it, full extension, full flexion, good stability varus and valgus testing and stressing 0 to 30 degrees.  Negative anterior drawer.  Next we, slight knee flexion, reapproximated the patellar arthrotomy with #1 Vicryl, interrupted figure-of-eight sutures, and then reinforced it with a running Stratafix.  Subcu with multiple 2-0 layers due to the ample subcutaneous adipose tissue and the skin we felt with staples due to her size.  Sterile dressing applied.  She had flexion to gravity at 90 degrees, full extension, full flexion.  Sterile dressing was applied. Placed in a knee immobilizer.  Extubated without difficulty, and transported to the recovery room in satisfactory condition.  The patient tolerated the procedure well.  No complications.  Assistant, Andrez Grime, [place].  Minimal blood loss.  Technical difficulty increased due to the patient's elevated BMI, required for assistance throughout the case for positioning, closure, and exposure.  Tourniquet time  69 minutes.     Jene Every, M.D.     Cordelia Pen  D:  06/13/2016  T:  06/13/2016  Job:  [id number]

## 2016-06-13 NOTE — Brief Op Note (Signed)
06/13/2016  9:41 AM  PATIENT:  Megan Petersen  55 y.o. female  PRE-OPERATIVE DIAGNOSIS:  DJD RIGHT KNEE  POST-OPERATIVE DIAGNOSIS:  DJD RIGHT KNEE  PROCEDURE:  Procedure(s): RIGHT TOTAL KNEE ARTHROPLASTY (Right)  SURGEON:  Surgeon(s) and Role:    * Jene EveryJeffrey Temitayo Covalt, MD - Primary  PHYSICIAN ASSISTANT:   ASSISTANTS: [place]   ANESTHESIA:   general  EBL:  Total I/O In: 1000 [I.V.:1000] Out: 350 [Urine:300; Blood:50]  BLOOD ADMINISTERED:none  DRAINS: none   LOCAL MEDICATIONS USED:  MARCAINE     SPECIMEN:  No Specimen  DISPOSITION OF SPECIMEN:  N/A  COUNTS:  YES  TOURNIQUET:   Total Tourniquet Time Documented: Thigh (Right) - 69 minutes Total: Thigh (Right) - 69 minutes   DICTATION: .Other Dictation: Dictation Number [id number]  PLAN OF CARE: Admit to inpatient   PATIENT DISPOSITION:  PACU - hemodynamically stable.   Delay start of Pharmacological VTE agent (>24hrs) due to surgical blood loss or risk of bleeding: no

## 2016-06-13 NOTE — Anesthesia Preprocedure Evaluation (Addendum)
Anesthesia Evaluation  Patient identified by MRN, date of birth, ID band Patient awake    Reviewed: Allergy & Precautions, NPO status , Patient's Chart, lab work & pertinent test results  Airway Mallampati: II  TM Distance: >3 FB Neck ROM: Full    Dental  (+) Edentulous Upper, Upper Dentures   Pulmonary asthma , pneumonia, resolved,    Pulmonary exam normal breath sounds clear to auscultation       Cardiovascular hypertension, Pt. 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    Abdominal (+) + obese,   Peds  Hematology   Anesthesia Other Findings   Reproductive/Obstetrics                             Anesthesia Physical  Anesthesia Plan  ASA: III  Anesthesia Plan: General   Post-op Pain Management:    Induction: Intravenous  Airway Management Planned: Oral ETT  Additional Equipment:   Intra-op Plan:   Post-operative Plan:   Informed Consent: I have reviewed the patients History and Physical, chart, labs and discussed the procedure including the risks, benefits and alternatives for the proposed anesthesia with the patient or authorized representative who has indicated his/her understanding and acceptance.   Dental advisory given  Plan Discussed with: CRNA, Anesthesiologist and Surgeon  Anesthesia Plan Comments:        Anesthesia Quick Evaluation

## 2016-06-13 NOTE — H&P (View-Only) (Signed)
Megan Petersen DOB: 1960-11-17 Married / Language: English / Race: White Female  H&P Date: 06/05/16  Chief Complaint: R knee pain  History of Present Illness The patient is a 55 year old female who comes in today for a preoperative History and Physical. The patient is scheduled for a right total knee arthroplasty to be performed by Dr. Javier DockerJeffrey C. Beane, MD at [facility] on June 13, 2016. Megan Petersen reports chronic pain in the right knee progressively worsening for years, refractory to conservative tx including NSAIDs, activity modifications, quad strengthening, bracing, injections. The pain is interfering with ADLs and quality of life at this point and she desires to proceed with surgery.  Dr. Shelle IronBeane and the patient mutually agreed to proceed with a total knee replacement. Risks and benefits of the procedure were discussed including stiffness, suboptimal range of motion, persistent pain, infection requiring removal of prosthesis and reinsertion, need for prophylactic antibiotics in the future, for example, dental procedures, possible need for manipulation, revision in the future and also anesthetic complications including DVT, PE, etc. We discussed the perioperative course, time in the hospital, postoperative recovery and the need for elevation to control swelling. We also discussed the predicted range of motion and the probability that squatting and kneeling would be unobtainable in the future. In addition, postoperative anticoagulation was discussed. We have obtained preoperative medical clearance as necessary. Provided her illustrated handout and discussed it in detail. They will enroll in the total joint replacement educational forum at the hospital.  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)  High blood pressure  Asthma  Migraine Headache  Acute medial meniscus tear of right knee, initial encounter (B14.782N(S83.241A)  Pain in limb (M79.609) [04/13/2010]: Impaired Vision  Vertigo  in past Pneumonia  in past S/P arthroscopy of knee (V45.89)  (Marked as Inactive) Radiculitis, Thoracic or Lumbar (724.4)  (Marked as Inactive) 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)  (Marked as Inactive) Sprain of calcaneofibular ligament (845.02)  (Marked as Inactive) Disorder Articular Cartilage, Ankle/Foot (718.07)  (Marked as Inactive) Gastroesophageal Reflux Disease  Hemorrhoids  Gallbladder Problems  Anemia  post-vaginal delivery hemorrhaged requiring 4 units Measles   Allergies DEMEROL [10/24/2009]: N/V SHELLFISH [10/24/2009]: LYRICA [10/24/2009]: blisters CODEINE [10/24/2009]: hives Betadine *ANTISEPTICS & DISINFECTANTS*  ChloraPrep One Step *ANTISEPTICS & DISINFECTANTS*  rash Erythromycin Ethylsuccinate *MACROLIDES*  Erythromycin *MACROLIDES*  GI issues Allergies Reconciled   Family History  Congestive Heart Failure  mother Diabetes Mellitus  sister and brother First Degree Relatives  Cancer  mother and father Heart disease in female family member before age 56  Hypertension  mother, sister and brother Osteoarthritis  sister Siblings  brother, sister and niece with DVTs  Social History  Tobacco use  Never smoker. 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 Ultram (50MG  Tablet, Oral) Active. Singulair (10MG  Tablet, Oral) Active. Furosemide (40MG  Tablet, Oral) Active. Indomethacin CR (75MG  Capsule ER, Oral) Active. Symbicort (160-4.5MCG/ACT Aerosol, Inhalation) Active. Voltaren (1% Gel, Transdermal) Active. Lidoderm (5% Patch, External) Active. (prn) NexIUM (Oral) Specific strength unknown - Active. CloNIDine HCl (0.1MG  Tablet, Oral) Active. Vitamin D (1000UNIT Capsule, Oral) Active. Tart Cherry Advanced (Oral) Active. Turmeric (450MG  Capsule, Oral) Active. Acidophilus (Oral) Active. Colace (100MG  Capsule, Oral) Active. Glucosamine Chondr 1500 Complx (Oral) Specific strength unknown - Active. Medications Reconciled  Pregnancy / Birth History Pregnant  no  Past Surgical History Gallbladder Surgery  open, 1989 Rotator Cuff Repair - Right  2017 Dr Shelle Iron Arthroscopic Knee Surgery - Right  2013  Review of Systems General Not Present- Chills, Fatigue, Fever, Memory Loss, Night Sweats, Weight Gain and Weight Loss. Skin Not Present- Eczema, Hives, Itching, Lesions and Rash. HEENT Present- Dentures and Headache. Not Present- Double Vision, Hearing Loss, Tinnitus and Visual Loss. Respiratory Present- Allergies. Not Present- Chronic Cough, Coughing up blood, Shortness of breath at rest and Shortness of breath with exertion. Cardiovascular Not Present- Chest Pain, Difficulty Breathing Lying Down, Murmur, Palpitations, Racing/skipping heartbeats and Swelling. Gastrointestinal Not Present-  Abdominal Pain, Bloody Stool, Constipation, Diarrhea, Difficulty Swallowing, Heartburn, Jaundice, Loss of appetitie, Nausea and Vomiting. Female Genitourinary Not Present- Blood in Urine, Discharge, Flank Pain, Incontinence, Painful Urination, Urgency, Urinary frequency, Urinary Retention, Urinating at Night and Weak urinary stream. Musculoskeletal Present- Back Pain, Joint Pain, Joint Swelling, Morning Stiffness, Muscle Pain and Spasms. Not Present- Muscle Weakness. Neurological Not Present- Blackout spells, Difficulty with balance, Dizziness, Paralysis, Tremor and Weakness. Psychiatric Not Present- Insomnia.  Physical Exam General Mental Status -Alert, cooperative and good historian. General Appearance-pleasant, Not in acute distress. Orientation-Oriented X3. Build & Nutrition-Well nourished and Well developed.  Head and Neck Head-normocephalic, atraumatic . Neck Global Assessment - supple, no bruit auscultated on the right, no bruit auscultated on the left.  Eye Pupil - Bilateral-Regular and Round. Motion - Bilateral-EOMI.  Chest and Lung Exam Auscultation Breath sounds - clear at anterior chest wall and clear at posterior chest wall. Adventitious sounds - No Adventitious sounds.  Cardiovascular Auscultation Rhythm - Regular rate and rhythm. Heart Sounds - S1 WNL and S2 WNL. Murmurs & Other Heart Sounds - Auscultation of the heart reveals - No Murmurs.  Abdomen Palpation/Percussion Tenderness - Abdomen is non-tender to palpation. Rigidity (guarding) - Abdomen is soft. Auscultation Auscultation of the abdomen reveals - Bowel sounds normal.  Female Genitourinary Not done, not pertinent to present illness  Musculoskeletal On exam, she has severe pain with palpation of medial joint line, patellofemoral joint line. Range is 0 to 90.  Equivocal [place]. No DVT. Ipsilateral hip and ankle exam is unremarkable.  RADIOGRAPHS AP, standing and lateral demonstrates  bone-on-bone arthrosis, medial compartment and of the patellofemoral joint. MRI demonstrates no lateral meniscus tear.  Assessment & Plan Primary osteoarthritis of right knee (M17.11)  Pt with end-stage Right knee DJD, bone-on-bone, refractory to conservative tx, scheduled for Right total knee replacement by Dr. Shelle Iron on 06/13/16. We again discussed the procedure itself as well as risks, complications and alternatives, including but not limited to DVT, PE, infx, bleeding, failure of procedure, need  for secondary procedure including manipulation, nerve injury, ongoing pain/symptoms, anesthesia risk, even stroke or death. Also discussed typical post-op protocols, activity restrictions, need for PT, flexion/extension exercises, time out of work. Discussed need for DVT ppx post-op with Xarelto then ASA per protocol. Discussed dental ppx. Also discussed limitations post-operatively such as kneeling and squatting. All questions were answered. Patient desires to proceed with surgery as scheduled. Will hold supplements, ASA and NSAIDs accordingly. Will remain NPO after MN night before surgery. Will present to [facility] for pre-op testing. Plan Xarelto 2 weeks post-op for DVT ppx then ASA. Plan Percocet, Robaxin, Colace, Miralax. Plan home with HHPT post-op with family members at home for assistance. Will follow up 10-14 days post-op for suture/staple removal and xrays.  Plan Right total knee replacement  Signed electronically by Dorothy Spark, PA-C for Dr. Shelle Iron

## 2016-06-13 NOTE — Interval H&P Note (Signed)
History and Physical Interval Note:  06/13/2016 7:13 AM  Megan Petersen  has presented today for surgery, with the diagnosis of DJD RIGHT KNEE  The various methods of treatment have been discussed with the patient and family. After consideration of risks, benefits and other options for treatment, the patient has consented to  Procedure(s): RIGHT TOTAL KNEE ARTHROPLASTY (Right) as a surgical intervention .  The patient's history has been reviewed, patient examined, no change in status, stable for surgery.  I have reviewed the patient's chart and labs.  Questions were answered to the patient's satisfaction.     Shanah Guimaraes C

## 2016-06-14 LAB — CBC
HCT: 37.2 % (ref 36.0–46.0)
HEMOGLOBIN: 12.5 g/dL (ref 12.0–15.0)
MCH: 27.5 pg (ref 26.0–34.0)
MCHC: 33.6 g/dL (ref 30.0–36.0)
MCV: 81.9 fL (ref 78.0–100.0)
Platelets: 283 10*3/uL (ref 150–400)
RBC: 4.54 MIL/uL (ref 3.87–5.11)
RDW: 13.3 % (ref 11.5–15.5)
WBC: 15.7 10*3/uL — ABNORMAL HIGH (ref 4.0–10.5)

## 2016-06-14 LAB — BASIC METABOLIC PANEL
Anion gap: 9 (ref 5–15)
BUN: 9 mg/dL (ref 6–20)
CHLORIDE: 102 mmol/L (ref 101–111)
CO2: 25 mmol/L (ref 22–32)
CREATININE: 0.52 mg/dL (ref 0.44–1.00)
Calcium: 8.5 mg/dL — ABNORMAL LOW (ref 8.9–10.3)
GFR calc Af Amer: >60 mL/min (ref >60)
GFR calc non Af Amer: >60 mL/min (ref >60)
GLUCOSE: 130 mg/dL — AB (ref 65–99)
Potassium: 4.4 mmol/L (ref 3.5–5.1)
SODIUM: 136 mmol/L (ref 135–145)

## 2016-06-14 MED ORDER — ACETAMINOPHEN 10 MG/ML IV SOLN
1000.0000 mg | Freq: Four times a day (QID) | INTRAVENOUS | Status: AC
Start: 2016-06-14 — End: 2016-06-15
  Administered 2016-06-14 (×3): 1000 mg via INTRAVENOUS
  Filled 2016-06-14 (×5): qty 100

## 2016-06-14 MED ORDER — FUROSEMIDE 40 MG PO TABS: 20.0000 mg | ORAL_TABLET | Freq: Every day | ORAL | Status: DC

## 2016-06-14 MED ORDER — LIP MEDEX EX OINT
TOPICAL_OINTMENT | CUTANEOUS | Status: DC | PRN
  Filled 2016-06-14: qty 7

## 2016-06-14 NOTE — Progress Notes (Signed)
CSW consulted for SNF placement. PN reviewed. [facility]. RNCM will assist with d/c planning. CSW signing off.  Cori RazorJamie Amiere Cawley LCSW [phone]

## 2016-06-14 NOTE — Evaluation (Signed)
Physical Therapy Evaluation Patient Details Name: Megan Petersen MRN: [id number] DOB: 04-Dec-1960 Today's Date: 06/14/2016   History of Present Illness  R TKA  Clinical Impression  Pt s/p R TKR presents with decreased R LE strength/ROM and post op pain limiting functional mobility.  Pt should progress to dc home with family assist and HHPT follow up.    Follow Up Recommendations Home health PT    Equipment Recommendations  Rolling walker with 5" wheels    Recommendations for Other Services OT consult     Precautions / Restrictions Precautions Precautions: Fall Required Braces or Orthoses: Knee Immobilizer - Right Knee Immobilizer - Right: On when out of bed or walking;Discontinue once straight leg raise with < 10 degree lag Restrictions Weight Bearing Restrictions: No Other Position/Activity Restrictions: WBAT R LE      Mobility  Bed Mobility Overal bed mobility: Needs Assistance Bed Mobility: Supine to Sit     Supine to sit: Mod assist     General bed mobility comments: assist with R LE to EOB, pt used rails  Transfers Overall transfer level: Needs assistance Equipment used: Rolling walker (2 wheeled) Transfers: Sit to/from Stand Sit to Stand: Min assist;Mod assist;+2 physical assistance;+2 safety/equipment         General transfer comment: cues for LE management and use of UEs to self assist  Ambulation/Gait Ambulation/Gait assistance: Min assist Ambulation Distance (Feet): 30 Feet (and 15') Assistive device: Rolling walker (2 wheeled) Gait Pattern/deviations: Step-to pattern;Decreased step length - right;Decreased step length - left;Shuffle;Trunk flexed Gait velocity: decr Gait velocity interpretation: Below normal speed for age/gender General Gait Details: cues for sequence, posture and position from [employer]  Stairs            Wheelchair Mobility    Modified Rankin (Stroke Patients Only)       Balance Overall balance assessment: Needs  assistance   Sitting balance-Leahy Scale: Good       Standing balance-Leahy Scale: Fair                               Pertinent Vitals/Pain Pain Assessment: 0-10 Pain Score: 6  Pain Location: R knee Pain Descriptors / Indicators: Aching;Sore Pain Intervention(s): Limited activity within patient's tolerance;Monitored during session;Premedicated before session;Ice applied    Home Living Family/patient expects to be discharged to:: Private residence Living Arrangements: Spouse/significant other Available Help at Discharge: Family Type of Home: House Home Access: Stairs to enter Entrance Stairs-Rails: LawyerLeft;Right Entrance Stairs-Number of Steps: 4 Home Layout: One level Home Equipment: Cane - single point;Crutches Additional Comments: 3 in 1 and RW had been delivered to pt's room    Prior Function Level of Independence: Independent with assistive device(s)               Hand Dominance   Dominant Hand: Right    Extremity/Trunk Assessment   Upper Extremity Assessment: Defer to OT evaluation           Lower Extremity Assessment: RLE deficits/detail RLE Deficits / Details: Quad strength ~2/5 with AAROM at knee - 5 - 35    Cervical / Trunk Assessment: Normal  Communication   Communication: No difficulties  Cognition Arousal/Alertness: Awake/alert Behavior During Therapy: Anxious Overall Cognitive Status: Within Functional Limits for tasks assessed                      General Comments      Exercises Total Joint Exercises  Ankle Circles/Pumps: AROM;Both;15 reps;Supine Quad Sets: AROM;Both;10 reps;Supine Heel Slides: AAROM;Right;Supine;15 reps Straight Leg Raises: AAROM;Right;10 reps;Supine   Assessment/Plan    PT Assessment Patient needs continued PT services  PT Problem List Decreased strength;Decreased range of motion;Decreased activity tolerance;Decreased balance;Decreased mobility;Decreased knowledge of use of  DME;Pain;Obesity          PT Treatment Interventions DME instruction;Gait training;Stair training;Functional mobility training;Therapeutic activities;Therapeutic exercise;Patient/family education    PT Goals (Current goals can be found in the Care Plan section)  Acute Rehab PT Goals Patient Stated Goal: go home PT Goal Formulation: With patient Time For Goal Achievement: 06/17/16 Potential to Achieve Goals: Good    Frequency 7X/week   Barriers to discharge        Co-evaluation PT/OT/SLP Co-Evaluation/Treatment: Yes Reason for Co-Treatment: For patient/therapist safety PT goals addressed during session: Mobility/safety with mobility OT goals addressed during session: ADL's and self-care       End of Session Equipment Utilized During Treatment: Gait belt;Right knee immobilizer Activity Tolerance: Patient tolerated treatment well;Patient limited by fatigue Patient left: in chair;with call bell/phone within reach;with family/visitor present Nurse Communication: Mobility status         Time: 1345-1425 PT Time Calculation (min) (ACUTE ONLY): 40 min   Charges:   PT Evaluation $PT Eval Low Complexity: 1 Procedure PT Treatments $Therapeutic Exercise: 8-22 mins   PT G Codes:        Megan Petersen 2016/06/16, 3:50 PM

## 2016-06-14 NOTE — Discharge Instructions (Signed)
Elevate leg above heart 6x a day for 20minutes each °Use knee immobilizer while walking until can SLR x 10 °Use knee immobilizer in bed to keep knee in extension °Aquacel dressing may remain in place until follow up. May shower with aquacel dressing in place. If the dressing becomes saturated or peels off, you may remove aquacel dressing. Place new dressing with gauze and tape or ACE bandage which should be kept clean and dry and changed daily. ° °INSTRUCTIONS AFTER JOINT REPLACEMENT  ° °o Remove items at home which could result in a fall. This includes throw rugs or furniture in walking pathways °o ICE to the affected joint every three hours while awake for 30 minutes at a time, for at least the first 3-5 days, and then as needed for pain and swelling.  Continue to use ice for pain and swelling. You may notice swelling that will progress down to the foot and ankle.  This is normal after surgery.  Elevate your leg when you are not up walking on it.   °o Continue to use the breathing machine you got in the hospital (incentive spirometer) which will help keep your temperature down.  It is common for your temperature to cycle up and down following surgery, especially at night when you are not up moving around and exerting yourself.  The breathing machine keeps your lungs expanded and your temperature down. ° ° °DIET:  As you were doing prior to hospitalization, we recommend a well-balanced diet. ° °DRESSING / WOUND CARE / SHOWERING ° °Keep the surgical dressing until follow up.  The dressing is water proof, so you can shower without any extra covering.  IF THE DRESSING FALLS OFF or the wound gets wet inside, change the dressing with sterile gauze.  Please use good hand washing techniques before changing the dressing.  Do not use any lotions or creams on the incision until instructed by your surgeon.   ° °ACTIVITY ° °o Increase activity slowly as tolerated, but follow the weight bearing instructions below.   °o No  driving for 6 weeks or until further direction given by your physician.  You cannot drive while taking narcotics.  °o No lifting or carrying greater than 10 lbs. until further directed by your surgeon. °o Avoid periods of inactivity such as sitting longer than an hour when not asleep. This helps prevent blood clots.  °o You may return to work once you are authorized by your doctor.  ° ° ° °WEIGHT BEARING  ° °Weight bearing as tolerated with assist device (walker, cane, etc) as directed, use it as long as suggested by your surgeon or therapist, typically at least 4-6 weeks. ° ° °EXERCISES ° °Results after joint replacement surgery are often greatly improved when you follow the exercise, range of motion and muscle strengthening exercises prescribed by your doctor. Safety measures are also important to protect the joint from further injury. Any time any of these exercises cause you to have increased pain or swelling, decrease what you are doing until you are comfortable again and then slowly increase them. If you have problems or questions, call your caregiver or physical therapist for advice.  ° °Rehabilitation is important following a joint replacement. After just a few days of immobilization, the muscles of the leg can become weakened and shrink (atrophy).  These exercises are designed to build up the tone and strength of the thigh and leg muscles and to improve motion. Often times heat used for twenty to thirty minutes   before working out will loosen up your tissues and help with improving the range of motion but do not use heat for the first two weeks following surgery (sometimes heat can increase post-operative swelling).   These exercises can be done on a training (exercise) mat, on the floor, on a table or on a bed. Use whatever works the best and is most comfortable for you.    Use music or television while you are exercising so that the exercises are a pleasant break in your day. This will make your life  better with the exercises acting as a break in your routine that you can look forward to.   Perform all exercises about fifteen times, three times per day or as directed.  You should exercise both the operative leg and the other leg as well.  Exercises include:    Quad Sets - Tighten up the muscle on the front of the thigh (Quad) and hold for 5-10 seconds.    Straight Leg Raises - With your knee straight (if you were given a brace, keep it on), lift the leg to 60 degrees, hold for 3 seconds, and slowly lower the leg.  Perform this exercise against resistance later as your leg gets stronger.   Leg Slides: Lying on your back, slowly slide your foot toward your buttocks, bending your knee up off the floor (only go as far as is comfortable). Then slowly slide your foot back down until your leg is flat on the floor again.   Angel Wings: Lying on your back spread your legs to the side as far apart as you can without causing discomfort.   Hamstring Strength:  Lying on your back, push your heel against the floor with your leg straight by tightening up the muscles of your buttocks.  Repeat, but this time bend your knee to a comfortable angle, and push your heel against the floor.  You may put a pillow under the heel to make it more comfortable if necessary.   A rehabilitation program following joint replacement surgery can speed recovery and prevent re-injury in the future due to weakened muscles. Contact your doctor or a physical therapist for more information on knee rehabilitation.    CONSTIPATION  Constipation is defined medically as fewer than three stools per week and severe constipation as less than one stool per week.  Even if you have a regular bowel pattern at home, your normal regimen is likely to be disrupted due to multiple reasons following surgery.  Combination of anesthesia, postoperative narcotics, change in appetite and fluid intake all can affect your bowels.   YOU MUST use at least  one of the following options; they are listed in order of increasing strength to get the job done.  They are all available over the counter, and you may need to use some, POSSIBLY even all of these options:    Drink plenty of fluids (prune juice may be helpful) and high fiber foods Colace 100 mg by mouth twice a day  Senokot for constipation as directed and as needed Dulcolax (bisacodyl), take with full glass of water  Miralax (polyethylene glycol) once or twice a day as needed.  If you have tried all these things and are unable to have a bowel movement in the first 3-4 days after surgery call either your surgeon or your primary doctor.    If you experience loose stools or diarrhea, hold the medications until you stool forms back up.  If your symptoms do  not get better within 1 week or if they get worse, check with your doctor.  If you experience "the worst abdominal pain ever" or develop nausea or vomiting, please contact the office immediately for further recommendations for treatment.   ITCHING:  If you experience itching with your medications, try taking only a single pain pill, or even half a pain pill at a time.  You can also use Benadryl over the counter for itching or also to help with sleep.   TED HOSE STOCKINGS:  Use stockings on both legs until for at least 2 weeks or as directed by physician office. They may be removed at night for sleeping.  MEDICATIONS:  See your medication summary on the After Visit Summary that nursing will review with you.  You may have some home medications which will be placed on hold until you complete the course of blood thinner medication.  It is important for you to complete the blood thinner medication as prescribed.  PRECAUTIONS:  If you experience chest pain or shortness of breath - call 911 immediately for transfer to the hospital emergency department.   If you develop a fever greater that 101 F, purulent drainage from wound, increased redness or  drainage from wound, foul odor from the wound/dressing, or calf pain - CONTACT YOUR SURGEON.                                                   FOLLOW-UP APPOINTMENTS:  If you do not already have a post-op appointment, please call the office for an appointment to be seen by your surgeon.  Guidelines for how soon to be seen are listed in your After Visit Summary, but are typically between 1-4 weeks after surgery.  OTHER INSTRUCTIONS:   Knee Replacement:  Do not place pillow under knee, focus on keeping the knee straight while resting. CPM instructions: 0-90 degrees, 2 hours in the morning, 2 hours in the afternoon, and 2 hours in the evening. Place foam block, curve side up under heel at all times except when in CPM or when walking.  DO NOT modify, tear, cut, or change the foam block in any way.  MAKE SURE YOU:   Understand these instructions.   Get help right away if you are not doing well or get worse.    Thank you for letting us be a part of your medical care team.  It is a privilege we respect greatly.  We hope these instructions will help you stay on track for a fast and full recovery!   Information on my medicine - XARELTO (Rivaroxaban)  This medication education was reviewed with me or my healthcare representative as part of my discharge preparation.  The pharmacist that spoke with me during my hospital stay was:  Susy Frizzle, Student-PharmD  Why was Xarelto prescribed for you? Xarelto was prescribed for you to reduce the risk of blood clots forming after orthopedic surgery. The medical term for these abnormal blood clots is venous thromboembolism (VTE).  What do you need to know about xarelto ? Take your Xarelto ONCE DAILY at the same time every day. You may take it either with or without food.  If you have difficulty swallowing the tablet whole, you may crush it and mix in applesauce just prior to taking your dose.  Take Xarelto exactly as prescribed by  your doctor and DO  NOT stop taking Xarelto without talking to the doctor who prescribed the medication.  Stopping without other VTE prevention medication to take the place of Xarelto may increase your risk of developing a clot.  After discharge, you should have regular check-up appointments with your healthcare provider that is prescribing your Xarelto.    What do you do if you miss a dose? If you miss a dose, take it as soon as you remember on the same day then continue your regularly scheduled once daily regimen the next day. Do not take two doses of Xarelto on the same day.   Important Safety Information A possible side effect of Xarelto is bleeding. You should call your healthcare provider right away if you experience any of the following: ? Bleeding from an injury or your nose that does not stop. ? Unusual colored urine (red or dark brown) or unusual colored stools (red or black). ? Unusual bruising for unknown reasons. ? A serious fall or if you hit your head (even if there is no bleeding).  Some medicines may interact with Xarelto and might increase your risk of bleeding while on Xarelto. To help avoid this, consult your healthcare provider or pharmacist prior to using any new prescription or non-prescription medications, including herbals, vitamins, non-steroidal anti-inflammatory drugs (NSAIDs) and supplements.  This website has more information on Xarelto: [email or web address].

## 2016-06-14 NOTE — Progress Notes (Signed)
Physical Therapy Treatment Patient Details Name: NADENE WITHERSPOON MRN: [id number] DOB: 04-Aug-1961 Today's Date: 06/14/2016    History of Present Illness R TKA    PT Comments    Pt progressing slowly with mobility - ltd this pm by pain and fatigue  Follow Up Recommendations  Home health PT     Equipment Recommendations  Rolling walker with 5" wheels    Recommendations for Other Services OT consult     Precautions / Restrictions Precautions Precautions: Fall Required Braces or Orthoses: Knee Immobilizer - Right Knee Immobilizer - Right: On when out of bed or walking;Discontinue once straight leg raise with < 10 degree lag Restrictions Weight Bearing Restrictions: No Other Position/Activity Restrictions: WBAT R LE    Mobility  Bed Mobility Overal bed mobility: Needs Assistance Bed Mobility: Sit to Supine     Supine to sit: Mod assist Sit to supine: Min assist;Mod assist   General bed mobility comments: assist with R LE  Transfers Overall transfer level: Needs assistance Equipment used: Rolling walker (2 wheeled) Transfers: Sit to/from Stand Sit to Stand: Min assist         General transfer comment: cues for LE management and use of UEs to self assist  Ambulation/Gait Ambulation/Gait assistance: Min assist Ambulation Distance (Feet): 15 Feet (twice) Assistive device: Rolling walker (2 wheeled) Gait Pattern/deviations: Step-to pattern;Decreased step length - right;Decreased step length - left;Shuffle;Trunk flexed Gait velocity: decr Gait velocity interpretation: Below normal speed for age/gender General Gait Details: cues for sequence, posture and position from [employer]            Wheelchair Mobility    Modified Rankin (Stroke Patients Only)       Balance Overall balance assessment: Needs assistance   Sitting balance-Leahy Scale: Good       Standing balance-Leahy Scale: Fair                      Cognition Arousal/Alertness:  Awake/alert Behavior During Therapy: Anxious Overall Cognitive Status: Within Functional Limits for tasks assessed                      Exercises Total Joint Exercises Ankle Circles/Pumps: AROM;Both;15 reps;Supine Quad Sets: AROM;Both;10 reps;Supine Heel Slides: AAROM;Right;Supine;15 reps Straight Leg Raises: AAROM;Right;10 reps;Supine    General Comments        Pertinent Vitals/Pain Pain Assessment: 0-10 Pain Score: 7  Pain Location: R knee Pain Descriptors / Indicators: Aching;Sore Pain Intervention(s): Limited activity within patient's tolerance;Monitored during session;Premedicated before session;Ice applied    Home Living Family/patient expects to be discharged to:: Private residence Living Arrangements: Spouse/significant other Available Help at Discharge: Family Type of Home: House Home Access: Stairs to enter Entrance Stairs-Rails: Left;Right Home Layout: One level Home Equipment: Cane - single point;Crutches Additional Comments: 3 in 1 and RW had been delivered to pt's room    Prior Function Level of Independence: Independent with assistive device(s)          PT Goals (current goals can now be found in the care plan section) Acute Rehab PT Goals Patient Stated Goal: go home PT Goal Formulation: With patient Time For Goal Achievement: 06/17/16 Potential to Achieve Goals: Good Progress towards PT goals: Progressing toward goals    Frequency    7X/week      PT Plan Current plan remains appropriate    Co-evaluation PT/OT/SLP Co-Evaluation/Treatment: Yes Reason for Co-Treatment: For patient/therapist safety PT goals addressed during session: Mobility/safety with mobility OT  goals addressed during session: ADL's and self-care     End of Session Equipment Utilized During Treatment: Gait belt;Right knee immobilizer Activity Tolerance: Patient limited by fatigue;Patient limited by pain Patient left: in bed;with call bell/phone within  reach;with family/visitor present     Time: [id number] PT Time Calculation (min) (ACUTE ONLY): 10 min  Charges:  $Gait Training: 8-22 mins $Therapeutic Exercise: 8-22 mins                    G Codes:      Jerritt Cardoza 06/14/2016, 4:18 PM

## 2016-06-14 NOTE — Progress Notes (Signed)
Patient ID: Megan RatelRhonda R Petersen, female   DOB: [birth year], 55 y.o.   MRN: [id number] Subjective: 1 Day Post-Op Procedure(s) (LRB): RIGHT TOTAL KNEE ARTHROPLASTY (Right) Patient reports pain as moderate.    Patient has complaints of Right knee and thigh pain. Nausea improved from yesteday. Foley removed this morning.  We will start therapy today. Plan is to go home with HHPT after hospital stay.  Objective: Vital signs in last 24 hours: Temp:  [97.7 F (36.5 C)-99.5 F (37.5 C)] 99.5 F (37.5 C) (10/20 0551) Pulse Rate:  [83-116] 89 (10/20 0551) Resp:  [9-22] 16 (10/20 0551) BP: (135-192)/(59-76) 135/68 (10/20 0551) SpO2:  [90 %-100 %] 99 % (10/20 0551)  Intake/Output from previous day:  Intake/Output Summary (Last 24 hours) at 06/14/16 0756 Last data filed at 06/14/16 0700  Gross per 24 hour  Intake             3330 ml  Output             2300 ml  Net             1030 ml    Intake/Output this shift: No intake/output data recorded.  Labs: Results for orders placed or performed during the hospital encounter of 06/13/16  CBC  Result Value Ref Range   WBC 15.7 (H) 4.0 - 10.5 K/uL   RBC 4.54 3.87 - 5.11 MIL/uL   Hemoglobin 12.5 12.0 - 15.0 g/dL   HCT 40.937.2 81.136.0 - 91.446.0 %   MCV 81.9 78.0 - 100.0 fL   MCH 27.5 26.0 - 34.0 pg   MCHC 33.6 30.0 - 36.0 g/dL   RDW 78.213.3 95.611.5 - 21.315.5 %   Platelets 283 150 - 400 K/uL  Basic metabolic panel  Result Value Ref Range   Sodium 136 135 - 145 mmol/L   Potassium 4.4 3.5 - 5.1 mmol/L   Chloride 102 101 - 111 mmol/L   CO2 25 22 - 32 mmol/L   Glucose, Bld 130 (H) 65 - 99 mg/dL   BUN 9 6 - 20 mg/dL   Creatinine, Ser 0.860.52 0.44 - 1.00 mg/dL   Calcium 8.5 (L) 8.9 - 10.3 mg/dL   GFR calc non Af Amer >60 >60 mL/min   GFR calc Af Amer >60 >60 mL/min   Anion gap 9 5 - 15    Exam - Neurologically intact ABD soft Neurovascular intact Sensation intact distally Intact pulses distally Dorsiflexion/Plantar flexion intact Incision: dressing C/D/I and  no drainage No cellulitis present Compartment soft no calf pain or sign of DVT Dressing - clean, dry, no drainage Motor function intact - moving foot and toes well on exam.   Assessment/Plan: 1 Day Post-Op Procedure(s) (LRB): RIGHT TOTAL KNEE ARTHROPLASTY (Right)  Advance diet Up with therapy D/C IV fluids Past Medical History:  Diagnosis Date  . Allergy   . Asthma   . Atypical glandular cells on Pap smear   . Esophageal reflux   . Family history of adverse reaction to anesthesia    brother slow to awaken  . GERD (gastroesophageal reflux disease)    Nexium controls  . Headache   . Hemorrhoids    history of- not a problem at present.  . Hyperlipidemia   . Hypertension   . Impaired fasting glucose    Dr. Jenny ReichmannZanard,PCP -told pt Hgb A1C level was good.  Marland Kitchen. LSIL (low grade squamous intraepithelial lesion) on Pap smear   . Morbid obesity (HCC)   . Osteoarthritis    Bilateral knees  .  Osteoarthritis   . Pneumonia 2016  . Primary cough headache   . Rosacea   . Swelling of limb   . Transfusion history    '88- s/p childbirth  . Trigeminal neuralgia   . URI (upper respiratory infection)    cough with green sputum since 11-26-15  . Vertigo    10 yrs ago- only-    DVT ppx- Xarelto Protocol Weight-Bearing as tolerated to Right leg No vaccines. Up with PT today Will add IV tylenol q6 to help with pain control Try to avoid increasing narcotics as she's already on supplemental O2 this morning likely due to narcotic use Will discuss with Dr. Shelle Iron Anticipate D/C Sunday depending on progress with PT and pain control  Joyce Leckey M. 06/14/2016, 7:56 AM

## 2016-06-14 NOTE — Progress Notes (Signed)
PT Cancellation Note  Patient Details Name: Rinaldo RatelRhonda R Hallstrom MRN: [id number] DOB: 1960/10/23   Cancelled Treatment:     PT eval deferred x 3 this am 2* pt nausea x2 and pt pain level on 3rd attempt.  Will reattempt early afternoon following additional pain meds.   Dhruva Orndoff 06/14/2016, 12:33 PM

## 2016-06-14 NOTE — Evaluation (Signed)
Occupational Therapy Evaluation Patient Details Name: Megan Petersen MRN: [id number] DOB: 1961/08/21 Today's Date: 06/14/2016    History of Present Illness R TKA   Clinical Impression   Pt with decline in function and safety with ADLs and ADL mobility. Pt will have 24 hours sup and assist for spouse at home. Pt would benefit from acute OT services to address impairments to increase level of function and safety    Follow Up Recommendations  No OT follow up;Supervision - Intermittent    Equipment Recommendations  3 in 1 bedside comode;Tub/shower seat    Recommendations for Other Services       Precautions / Restrictions Precautions Precautions: Fall Required Braces or Orthoses: Knee Immobilizer - Right Knee Immobilizer - Right: On when out of bed or walking;Discontinue once straight leg raise with < 10 degree lag Restrictions Weight Bearing Restrictions: No Other Position/Activity Restrictions: WBAT R LE      Mobility Bed Mobility Overal bed mobility: Needs Assistance Bed Mobility: Supine to Sit     Supine to sit: Mod assist     General bed mobility comments: assist with R LE to EOB, pt used rails  Transfers Overall transfer level: Needs assistance Equipment used: Rolling walker (2 wheeled) Transfers: Sit to/from Stand Sit to Stand: Mod assist;Min assist;+2 safety/equipment              Balance Overall balance assessment: Needs assistance   Sitting balance-Leahy Scale: Good       Standing balance-Leahy Scale: Fair                              ADL Overall ADL's : Needs assistance/impaired     Grooming: Wash/dry hands;Wash/dry face;Min guard;Standing   Upper Body Bathing: Set up;Sitting   Lower Body Bathing: Maximal assistance   Upper Body Dressing : Set up;Sitting   Lower Body Dressing: Total assistance   Toilet Transfer: Minimal assistance;Moderate assistance;RW;Comfort height toilet;Ambulation   Toileting- Clothing  Manipulation and Hygiene: Maximal assistance;Sit to/from stand   Tub/ Shower Transfer: 3 in 1;Ambulation;Rolling walker;Minimal assistance;Moderate assistance   Functional mobility during ADLs: Moderate assistance;Minimal assistance;+2 for safety/equipment General ADL Comments: Provided ADL A/E education. Pt's husband familiar with how to use     Vision  wears glasses, no change from baseline              Pertinent Vitals/Pain Pain Assessment: 0-10 Pain Score: 6  Pain Location: R knee Pain Descriptors / Indicators: Aching;Sore Pain Intervention(s): Limited activity within patient's tolerance;Monitored during session;Repositioned     Hand Dominance Right   Extremity/Trunk Assessment Upper Extremity Assessment Upper Extremity Assessment: Overall WFL for tasks assessed   Lower Extremity Assessment Lower Extremity Assessment: Defer to PT evaluation       Communication Communication Communication: No difficulties   Cognition Arousal/Alertness: Awake/alert Behavior During Therapy: Anxious Overall Cognitive Status: Within Functional Limits for tasks assessed                     General Comments   pt pleasant, cooperative and anxious. Spouse supportive                 Home Living Family/patient expects to be discharged to:: Private residence Living Arrangements: Spouse/significant other Available Help at Discharge: Family Type of Home: House Home Access: Stairs to enter Secretary/administrator of Steps: 4 Entrance Stairs-Rails: Left;Right Home Layout: One level     Bathroom Shower/Tub: Psychologist, counselling  Bathroom Toilet: Standard     Home Equipment: Cane - single point;Crutches   Additional Comments: 3 in 1 and RW had been delivered to pt's room      Prior Functioning/Environment Level of Independence: Independent with assistive device(s)                 OT Problem List: Pain;Impaired balance (sitting and/or standing);Decreased activity  tolerance;Decreased knowledge of use of DME or AE   OT Treatment/Interventions: Self-care/ADL training;DME and/or AE instruction;Therapeutic activities;Patient/family education    OT Goals(Current goals can be found in the care plan section) Acute Rehab OT Goals Patient Stated Goal: go home OT Goal Formulation: With patient/family Time For Goal Achievement: 06/21/16 Potential to Achieve Goals: Good ADL Goals Pt Will Perform Grooming: with set-up;with supervision;standing;with caregiver independent in assisting Pt Will Perform Lower Body Bathing: with mod assist;with min assist;with caregiver independent in assisting Pt Will Perform Lower Body Dressing: with caregiver independent in assisting;with mod assist Pt Will Transfer to Toilet: with min assist;with min guard assist Pt Will Perform Toileting - Clothing Manipulation and hygiene: with mod assist;sit to/from stand;with caregiver independent in assisting Pt Will Perform Tub/Shower Transfer: with min assist;with min guard assist;shower seat;3 in 1;with caregiver independent in assisting  OT Frequency: Min 2X/week   Barriers to D/C:    no barriers       Co-evaluation PT/OT/SLP Co-Evaluation/Treatment: Yes Reason for Co-Treatment: For patient/therapist safety (pt anxiety) PT goals addressed during session: Mobility/safety with mobility OT goals addressed during session: ADL's and self-care      End of Session Equipment Utilized During Treatment: Rolling walker;Other (comment) (3 in 1) CPM Right Knee CPM Right Knee: Off  Activity Tolerance: Patient limited by pain Patient left: in chair;with call bell/phone within reach;with family/visitor present;Other (comment) (with PT)   Time: [id number] OT Time Calculation (min): 35 min Charges:  OT General Charges $OT Visit: 1 Procedure OT Evaluation $OT Eval Moderate Complexity: 1 Procedure G-Codes:    Galen ManilaSpencer, Nobie Alleyne Jeanette 06/14/2016, 2:34 PM

## 2016-06-14 NOTE — Progress Notes (Signed)
OT Cancellation Note  Patient Details Name: Megan RatelRhonda R Petersen MRN: [id number] DOB: 1961-04-14   Cancelled Treatment:    Reason Eval/Treat Not Completed: Other (comment). Pt states that hs eis feeling better, however is awaiting her lunch and requested OT return later. Will check back later as able  Megan ManilaSpencer, Megan Petersen 06/14/2016, 1:06 PM

## 2016-06-15 LAB — CBC
HEMATOCRIT: 37.7 % (ref 36.0–46.0)
HEMOGLOBIN: 12.3 g/dL (ref 12.0–15.0)
MCH: 27.6 pg (ref 26.0–34.0)
MCHC: 32.6 g/dL (ref 30.0–36.0)
MCV: 84.7 fL (ref 78.0–100.0)
Platelets: 282 10*3/uL (ref 150–400)
RBC: 4.45 MIL/uL (ref 3.87–5.11)
RDW: 13.6 % (ref 11.5–15.5)
WBC: 13.3 10*3/uL — ABNORMAL HIGH (ref 4.0–10.5)

## 2016-06-15 NOTE — Progress Notes (Signed)
Physical Therapy Treatment Patient Details Name: Megan RatelRhonda R Petersen MRN: [id number] DOB: 1961-03-07 Today's Date: 06/15/2016    History of Present Illness R TKA    PT Comments    Pt progressing well with mobility and hopeful for dc home tomorrow.  Follow Up Recommendations  Home health PT     Equipment Recommendations  Rolling walker with 5" wheels    Recommendations for Other Services OT consult     Precautions / Restrictions Precautions Precautions: Fall Required Braces or Orthoses: Knee Immobilizer - Right Knee Immobilizer - Right: On when out of bed or walking;Discontinue once straight leg raise with < 10 degree lag Restrictions Weight Bearing Restrictions: No Other Position/Activity Restrictions: WBAT R LE    Mobility  Bed Mobility Overal bed mobility: Needs Assistance Bed Mobility: Supine to Sit     Supine to sit: HOB elevated;Min assist Sit to supine: Min assist   General bed mobility comments: cues for sequence and assist with R LE  Transfers Overall transfer level: Needs assistance Equipment used: Rolling walker (2 wheeled) Transfers: Sit to/from Stand Sit to Stand: Min guard         General transfer comment: cues for LE management and use of UEs to self assist  Ambulation/Gait Ambulation/Gait assistance: Min guard Ambulation Distance (Feet): 147 Feet Assistive device: Rolling walker (2 wheeled) Gait Pattern/deviations: Step-to pattern;Decreased step length - right;Decreased step length - left;Shuffle;Trunk flexed Gait velocity: decr Gait velocity interpretation: Below normal speed for age/gender General Gait Details: cues for sequence, posture and position from [employer]   Stairs            Wheelchair Mobility    Modified Rankin (Stroke Patients Only)       Balance                                    Cognition Arousal/Alertness: Awake/alert Behavior During Therapy: Anxious Overall Cognitive Status: Within Functional  Limits for tasks assessed                      Exercises Total Joint Exercises Ankle Circles/Pumps: AROM;Both;15 reps;Supine Quad Sets: AROM;Both;10 reps;Supine Heel Slides: AAROM;Right;Supine;15 reps Straight Leg Raises: AAROM;Right;10 reps;Supine Goniometric ROM: AAROM R knee -8 - 40 with ++muscle guarding    General Comments        Pertinent Vitals/Pain Pain Assessment: 0-10 Pain Score: 5  Pain Location: R knee Pain Descriptors / Indicators: Aching;Sore Pain Intervention(s): Limited activity within patient's tolerance;Premedicated before session;Monitored during session    Home Living                      Prior Function            PT Goals (current goals can now be found in the care plan section) Acute Rehab PT Goals Patient Stated Goal: go home PT Goal Formulation: With patient Time For Goal Achievement: 06/17/16 Potential to Achieve Goals: Good Progress towards PT goals: Progressing toward goals    Frequency    7X/week      PT Plan Current plan remains appropriate    Co-evaluation             End of Session Equipment Utilized During Treatment: Gait belt;Right knee immobilizer Activity Tolerance: Patient tolerated treatment well Patient left: in chair;with call bell/phone within reach     Time: 1455-1515 PT Time Calculation (min) (ACUTE ONLY): 20 min  Charges:  $  Gait Training: 8-22 mins $Therapeutic Exercise: 8-22 mins                    G Codes:      Jameire Kouba 2016-07-09, 3:26 PM

## 2016-06-15 NOTE — Progress Notes (Signed)
Patient ID: Megan Petersen, female   DOB: 03-Dec-1960, 55 y.o.   MRN: [id number] Subjective: 2 Days Post-Op Procedure(s) (LRB): RIGHT TOTAL KNEE ARTHROPLASTY (Right) Patient reports pain as moderate.    Patient has complaints of R knee pain, improved from yesterday Nauseous when getting up out of bed Not ready for D/C today Voiding without difficulty Seen in AM rounds with Dr. Darrelyn Hillock  Objective: Vital signs in last 24 hours: Temp:  [99 F (37.2 C)-99.6 F (37.6 C)] 99.4 F (37.4 C) (10/21 0522) Pulse Rate:  [88-109] 109 (10/21 0522) Resp:  [16-18] 16 (10/21 0522) BP: (137-152)/(60-68) 147/68 (10/21 0522) SpO2:  [94 %-98 %] 94 % (10/21 0522)  Intake/Output from previous day:  Intake/Output Summary (Last 24 hours) at 06/15/16 0948 Last data filed at 06/15/16 0522  Gross per 24 hour  Intake             1315 ml  Output             2700 ml  Net            -1385 ml    Intake/Output this shift: No intake/output data recorded.  Labs: Results for orders placed or performed during the hospital encounter of 06/13/16  CBC  Result Value Ref Range   WBC 15.7 (H) 4.0 - 10.5 K/uL   RBC 4.54 3.87 - 5.11 MIL/uL   Hemoglobin 12.5 12.0 - 15.0 g/dL   HCT 96.2 95.2 - 84.1 %   MCV 81.9 78.0 - 100.0 fL   MCH 27.5 26.0 - 34.0 pg   MCHC 33.6 30.0 - 36.0 g/dL   RDW 32.4 40.1 - 02.7 %   Platelets 283 150 - 400 K/uL  Basic metabolic panel  Result Value Ref Range   Sodium 136 135 - 145 mmol/L   Potassium 4.4 3.5 - 5.1 mmol/L   Chloride 102 101 - 111 mmol/L   CO2 25 22 - 32 mmol/L   Glucose, Bld 130 (H) 65 - 99 mg/dL   BUN 9 6 - 20 mg/dL   Creatinine, Ser 2.53 0.44 - 1.00 mg/dL   Calcium 8.5 (L) 8.9 - 10.3 mg/dL   GFR calc non Af Amer >60 >60 mL/min   GFR calc Af Amer >60 >60 mL/min   Anion gap 9 5 - 15  CBC  Result Value Ref Range   WBC 13.3 (H) 4.0 - 10.5 K/uL   RBC 4.45 3.87 - 5.11 MIL/uL   Hemoglobin 12.3 12.0 - 15.0 g/dL   HCT 66.4 40.3 - 47.4 %   MCV 84.7 78.0 - 100.0 fL   MCH 27.6 26.0 - 34.0 pg   MCHC 32.6 30.0 - 36.0 g/dL   RDW 25.9 56.3 - 87.5 %   Platelets 282 150 - 400 K/uL    Exam - Neurologically intact ABD soft Neurovascular intact Sensation intact distally Intact pulses distally Dorsiflexion/Plantar flexion intact Incision: dressing C/D/I and scant drainage No cellulitis present Compartment soft no calf pain or sign of DVT Dressing/Incision - clean, dry Motor function intact - moving foot and toes well on exam.   Assessment/Plan: 2 Days Post-Op Procedure(s) (LRB): RIGHT TOTAL KNEE ARTHROPLASTY (Right)  Advance diet Up with therapy D/C IV fluids Past Medical History:  Diagnosis Date  . Allergy   . Asthma   . Atypical glandular cells on Pap smear   . Esophageal reflux   . Family history of adverse reaction to anesthesia    brother slow to awaken  . GERD (gastroesophageal  reflux disease)    Nexium controls  . Headache   . Hemorrhoids    history of- not a problem at present.  . Hyperlipidemia   . Hypertension   . Impaired fasting glucose    Dr. Jenny ReichmannZanard,PCP -told pt Hgb A1C level was good.  Marland Kitchen. LSIL (low grade squamous intraepithelial lesion) on Pap smear   . Morbid obesity (HCC)   . Osteoarthritis    Bilateral knees  . Osteoarthritis   . Pneumonia 2016  . Primary cough headache   . Rosacea   . Swelling of limb   . Transfusion history    '88- s/p childbirth  . Trigeminal neuralgia   . URI (upper respiratory infection)    cough with green sputum since 11-26-15  . Vertigo    10 yrs ago- only-    DVT Prophylaxis - Xarelto  Protocol Weight-Bearing as tolerated to right leg Plan D/C home with HHPT tomorrow  Andrez GrimeBISSELL, JACLYN M. 06/15/2016, 9:48 AM

## 2016-06-15 NOTE — Progress Notes (Signed)
Occupational Therapy Treatment Patient Details Name: Megan Petersen MRN: [id number] DOB: 25-Nov-1960 Today's Date: 06/15/2016    History of present illness R TKA   OT comments  All OT education completed and pt questions answered. No further OT needs at this time. Patient to have assistance from family at home (husband and daughter). OT will sign off.  Follow Up Recommendations  No OT follow up;Supervision - Intermittent    Equipment Recommendations  3 in 1 bedside comode    Recommendations for Other Services      Precautions / Restrictions Precautions Precautions: Fall Required Braces or Orthoses: Knee Immobilizer - Right Knee Immobilizer - Right: On when out of bed or walking;Discontinue once straight leg raise with < 10 degree lag Restrictions Weight Bearing Restrictions: No Other Position/Activity Restrictions: WBAT R LE       Mobility Bed Mobility Overal bed mobility: Needs Assistance Bed Mobility: Supine to Sit     Supine to sit: Mod assist;HOB elevated     General bed mobility comments: assist with R LE  Transfers Overall transfer level: Needs assistance Equipment used: Rolling walker (2 wheeled) Transfers: Sit to/from Stand Sit to Stand: Min guard              Balance                                   ADL Overall ADL's : Needs assistance/impaired     Grooming: Wash/dry hands;Set up;Wash/dry face;Sitting                   Toilet Transfer: Min guard;Stand-pivot;BSC;RW   Toileting- Clothing Manipulation and Hygiene: Supervision/safety;Sit to/from stand       Functional mobility during ADLs: Min guard;Rolling walker General ADL Comments: Patient reports no questions regarding ADL techniques including LB self-care (husband to A), shower transfer (husband to A). She did well with toilet transfer and has 3 in 1 for home. Patient had the most difficulty getting OOB.      Vision                     Perception      Praxis      Cognition   Behavior During Therapy: Anxious Overall Cognitive Status: Within Functional Limits for tasks assessed                       Extremity/Trunk Assessment               Exercises     Shoulder Instructions       General Comments      Pertinent Vitals/ Pain       Pain Assessment: 0-10 Pain Score: 7  Pain Location: R knee Pain Descriptors / Indicators: Aching;Sore Pain Intervention(s): Monitored during session;Repositioned;Patient requesting pain meds-RN notified  Home Living                                          Prior Functioning/Environment              Frequency           Progress Toward Goals  OT Goals(current goals can now be found in the care plan section)  Progress towards OT goals: Goals met/education completed, patient discharged from OT  Acute Rehab OT Goals  Patient Stated Goal: go home  Plan All goals met and education completed, patient discharged from OT services    Co-evaluation                 End of Session Equipment Utilized During Treatment: Right knee immobilizer   Activity Tolerance Patient tolerated treatment well   Patient Left in chair;with call bell/phone within reach;with chair alarm set   Nurse Communication Patient requests pain meds        Time: 3491-7915 OT Time Calculation (min): 12 min  Charges: OT General Charges $OT Visit: 1 Procedure OT Treatments $Self Care/Home Management : 8-22 mins  Megan Petersen A 06/15/2016, 9:47 AM

## 2016-06-15 NOTE — Progress Notes (Signed)
Physical Therapy Treatment Patient Details Name: Megan Petersen MRN: [id number] DOB: 1961/04/12 Today's Date: 06/15/2016    History of Present Illness R TKA    PT Comments    Pt cooperative and feeling better this am.  Noted improvement in all mobility tasks and activity tolerance.  Follow Up Recommendations  Home health PT     Equipment Recommendations  Rolling walker with 5" wheels    Recommendations for Other Services OT consult     Precautions / Restrictions Precautions Precautions: Fall Required Braces or Orthoses: Knee Immobilizer - Right Knee Immobilizer - Right: On when out of bed or walking;Discontinue once straight leg raise with < 10 degree lag Restrictions Weight Bearing Restrictions: No Other Position/Activity Restrictions: WBAT R LE    Mobility  Bed Mobility Overal bed mobility: Needs Assistance Bed Mobility: Sit to Supine     Supine to sit: Mod assist;HOB elevated Sit to supine: Min assist   General bed mobility comments: cues for sequence and assist with R LE  Transfers Overall transfer level: Needs assistance Equipment used: Rolling walker (2 wheeled) Transfers: Sit to/from Stand Sit to Stand: Min guard         General transfer comment: cues for LE management and use of UEs to self assist  Ambulation/Gait Ambulation/Gait assistance: Min guard Ambulation Distance (Feet): 75 Feet Assistive device: Rolling walker (2 wheeled) Gait Pattern/deviations: Step-to pattern;Decreased step length - right;Decreased step length - left;Shuffle;Trunk flexed Gait velocity: decr Gait velocity interpretation: Below normal speed for age/gender General Gait Details: cues for sequence, posture and position from [employer]            Wheelchair Mobility    Modified Rankin (Stroke Patients Only)       Balance                                    Cognition Arousal/Alertness: Awake/alert Behavior During Therapy: Anxious Overall  Cognitive Status: Within Functional Limits for tasks assessed                      Exercises Total Joint Exercises Ankle Circles/Pumps: AROM;Both;15 reps;Supine Quad Sets: AROM;Both;10 reps;Supine Heel Slides: AAROM;Right;Supine;15 reps Straight Leg Raises: AAROM;Right;10 reps;Supine Goniometric ROM: AAROM R knee -8 - 40 with ++muscle guarding    General Comments        Pertinent Vitals/Pain Pain Assessment: 0-10 Pain Score: 7  Pain Location: R knee Pain Descriptors / Indicators: Aching;Sore Pain Intervention(s): Limited activity within patient's tolerance;Monitored during session;Premedicated before session;Ice applied    Home Living                      Prior Function            PT Goals (current goals can now be found in the care plan section) Acute Rehab PT Goals Patient Stated Goal: go home PT Goal Formulation: With patient Time For Goal Achievement: 06/17/16 Potential to Achieve Goals: Good Progress towards PT goals: Progressing toward goals    Frequency    7X/week      PT Plan Current plan remains appropriate    Co-evaluation             End of Session Equipment Utilized During Treatment: Gait belt;Right knee immobilizer Activity Tolerance: Patient limited by fatigue;Patient limited by pain Patient left: in bed;with call bell/phone within reach;with family/visitor present  Time: 1015-1050 PT Time Calculation (min) (ACUTE ONLY): 35 min  Charges:  $Gait Training: 8-22 mins $Therapeutic Exercise: 8-22 mins                    G Codes:      Megan Petersen 06/15/2016, 12:37 PM

## 2016-06-15 NOTE — Progress Notes (Signed)
Pt reports having RW and 3n1 bedside commode [facility] HH arranged with [facility]. Please see previous NCM notes. Isidoro DonningAlesia Ricarda Atayde RN CCM Case Mgmt phone [phone]

## 2016-06-16 NOTE — Progress Notes (Signed)
Physical Therapy Treatment Patient Details Name: Megan Petersen MRN: [id number] DOB: 07/01/1961 Today's Date: 06/16/2016    History of Present Illness R TKA    PT Comments    Pt progressing well with mobility, stair training completed, reviewed HEP. From PT standpoint she is ready to DC home.   Follow Up Recommendations  Home health PT     Equipment Recommendations  Rolling walker with 5" wheels    Recommendations for Other Services OT consult     Precautions / Restrictions Precautions Precautions: Fall Required Braces or Orthoses: Knee Immobilizer - Right Knee Immobilizer - Right: On when out of bed or walking;Discontinue once straight leg raise with < 10 degree lag Restrictions Weight Bearing Restrictions: No Other Position/Activity Restrictions: WBAT R LE    Mobility  Bed Mobility Overal bed mobility: Needs Assistance Bed Mobility: Supine to Sit     Supine to sit: HOB elevated;Supervision     General bed mobility comments: cues for sequence and assist with R LE  Transfers Overall transfer level: Needs assistance Equipment used: Rolling walker (2 wheeled) Transfers: Sit to/from Stand Sit to Stand: Supervision         General transfer comment: cues for LE management and use of UEs to self assist  Ambulation/Gait Ambulation/Gait assistance: Supervision Ambulation Distance (Feet): 140 Feet Assistive device: Rolling walker (2 wheeled)   Gait velocity: decr   General Gait Details: good sequencing, steady with RW, no LOB   Stairs Stairs: Yes Stairs assistance: Min guard Stair Management: Two rails;Forwards;Step to pattern Number of Stairs: 3 General stair comments: VCs for sequencing, min/guard safety  Wheelchair Mobility    Modified Rankin (Stroke Patients Only)       Balance     Sitting balance-Leahy Scale: Good       Standing balance-Leahy Scale: Fair                      Cognition Arousal/Alertness:  Awake/alert Behavior During Therapy: WFL for tasks assessed/performed Overall Cognitive Status: Within Functional Limits for tasks assessed                      Exercises Total Joint Exercises Ankle Circles/Pumps: AROM;Both;15 reps;Supine Quad Sets: AROM;Both;10 reps;Supine Short Arc Quad: AAROM;Right;10 reps;Supine Heel Slides: AAROM;Right;Supine;10 reps Hip ABduction/ADduction: AAROM;Right;5 reps;Supine Straight Leg Raises: AAROM;Right;10 reps;Supine Long Arc Quad: AAROM;Right;5 reps;Seated Knee Flexion: AAROM;Right;5 reps;Seated Goniometric ROM: 5-40* AAROM    General Comments        Pertinent Vitals/Pain Pain Score: 5  Pain Location: R knee Pain Descriptors / Indicators: Sore Pain Intervention(s): Limited activity within patient's tolerance;Monitored during session;Ice applied;RN gave pain meds during session    Home Living                      Prior Function            PT Goals (current goals can now be found in the care plan section) Acute Rehab PT Goals Patient Stated Goal: go home PT Goal Formulation: With patient Time For Goal Achievement: 06/17/16 Potential to Achieve Goals: Good Progress towards PT goals: Progressing toward goals    Frequency    7X/week      PT Plan Current plan remains appropriate    Co-evaluation             End of Session Equipment Utilized During Treatment: Gait belt;Right knee immobilizer Activity Tolerance: Patient tolerated treatment well Patient left: in chair;with call bell/phone  within reach     Time: 0758-0831 PT Time Calculation (min) (ACUTE ONLY): 33 min  Charges:  $Gait Training: 8-22 mins $Therapeutic Exercise: 8-22 mins                    G Codes:      Tamala SerUhlenberg, Lake[place] Petion Kistler 06/16/2016, 8:36 AM [phone]

## 2016-06-16 NOTE — Discharge Summary (Signed)
Physician Discharge Summary   Patient ID: Megan Petersen MRN: [id number] DOB/AGE: Oct 30, 1960 55 y.o.  Admit date: 06/13/2016 Discharge date: 06/16/2016  Admission Diagnoses:  Active Problems:   Right knee DJD   Discharge Diagnoses:  Same   Surgeries: Procedure(s): RIGHT TOTAL KNEE ARTHROPLASTY on 06/13/2016   Consultants: PT/OT  Discharged Condition: Stable  Hospital Course: Megan RatelRhonda R Akel is an 55 y.o. female who was admitted 06/13/2016 with a chief complaint of right knee pain, and found to have a diagnosis of right knee endstage osteoarthritis.  They were brought to the operating room on 06/13/2016 and underwent the above named procedures.    The patient had an uncomplicated hospital course and was stable for discharge.  Recent vital signs:  Vitals:   06/15/16 2050 06/16/16 0458  BP: (!) 159/74 (!) 156/70  Pulse: 94 90  Resp: 18 20  Temp: 99.8 F (37.7 C) 98.5 F (36.9 C)    Recent laboratory studies:  Results for orders placed or performed during the hospital encounter of 06/13/16  CBC  Result Value Ref Range   WBC 15.7 (H) 4.0 - 10.5 K/uL   RBC 4.54 3.87 - 5.11 MIL/uL   Hemoglobin 12.5 12.0 - 15.0 g/dL   HCT 40.937.2 81.136.0 - 91.446.0 %   MCV 81.9 78.0 - 100.0 fL   MCH 27.5 26.0 - 34.0 pg   MCHC 33.6 30.0 - 36.0 g/dL   RDW 78.213.3 95.611.5 - 21.315.5 %   Platelets 283 150 - 400 K/uL  Basic metabolic panel  Result Value Ref Range   Sodium 136 135 - 145 mmol/L   Potassium 4.4 3.5 - 5.1 mmol/L   Chloride 102 101 - 111 mmol/L   CO2 25 22 - 32 mmol/L   Glucose, Bld 130 (H) 65 - 99 mg/dL   BUN 9 6 - 20 mg/dL   Creatinine, Ser 0.860.52 0.44 - 1.00 mg/dL   Calcium 8.5 (L) 8.9 - 10.3 mg/dL   GFR calc non Af Amer >60 >60 mL/min   GFR calc Af Amer >60 >60 mL/min   Anion gap 9 5 - 15  CBC  Result Value Ref Range   WBC 13.3 (H) 4.0 - 10.5 K/uL   RBC 4.45 3.87 - 5.11 MIL/uL   Hemoglobin 12.3 12.0 - 15.0 g/dL   HCT 57.837.7 46.936.0 - 62.946.0 %   MCV 84.7 78.0 - 100.0 fL   MCH 27.6 26.0 -  34.0 pg   MCHC 32.6 30.0 - 36.0 g/dL   RDW 52.813.6 41.311.5 - 24.415.5 %   Platelets 282 150 - 400 K/uL    Discharge Medications:     Medication List    STOP taking these medications   diclofenac sodium 1 % Gel Commonly known as:  VOLTAREN   EQL COCONUT OIL 1000 MG Caps Generic drug:  Coconut Oil   glucosamine-chondroitin 500-400 MG tablet   indomethacin 75 MG CR capsule Commonly known as:  INDOCIN SR   oxyCODONE-acetaminophen 5-325 MG tablet Commonly known as:  PERCOCET Replaced by:  oxyCODONE-acetaminophen 10-325 MG tablet   TART CHERRY ADVANCED PO   TURMERIC PO     TAKE these medications   albuterol 108 (90 Base) MCG/ACT inhaler Commonly known as:  PROAIR HFA Inhale 2 puffs into the lungs every 6 (six) hours as needed for wheezing or shortness of breath.   budesonide-formoterol 160-4.5 MCG/ACT inhaler Commonly known as:  SYMBICORT Inhale 2 puffs into the lungs 2 (two) times daily.   cloNIDine 0.1 MG tablet Commonly  known as:  CATAPRES Take 0.1 mg by mouth every morning. Reported on 12/20/2015   COMBIVENT RESPIMAT 20-100 MCG/ACT Aers respimat Generic drug:  Ipratropium-Albuterol Inhale 1 puff into the lungs every 6 (six) hours as needed for wheezing.   docusate sodium 100 MG capsule Commonly known as:  COLACE Take 1 capsule (100 mg total) by mouth 2 (two) times daily as needed for mild constipation.   esomeprazole 40 MG capsule Commonly known as:  NEXIUM Take 40 mg by mouth daily at 12 noon.   esomeprazole 40 MG capsule Commonly known as:  NEXIUM Take 1 capsule (40 mg total) by mouth daily before breakfast.   furosemide 40 MG tablet Commonly known as:  LASIX Take 0.5 tablets (20 mg total) by mouth daily.   guaiFENesin 600 MG 12 hr tablet Commonly known as:  MUCINEX Take 600 mg by mouth as needed for cough.   lidocaine 5 % Commonly known as:  LIDODERM Place 1 patch onto the skin daily as needed (pain). Remove & Discard patch within 12 hours or as directed by  MD   methocarbamol 500 MG tablet Commonly known as:  ROBAXIN Take 1 tablet (500 mg total) by mouth every 6 (six) hours as needed for muscle spasms. What changed:  when to take this   montelukast 10 MG tablet Commonly known as:  SINGULAIR Take 1 tablet (10 mg total) by mouth at bedtime.   oxyCODONE-acetaminophen 10-325 MG tablet Commonly known as:  PERCOCET Take 1 tablet by mouth every 4 (four) hours as needed for pain (severe). Replaces:  oxyCODONE-acetaminophen 5-325 MG tablet   polyethylene glycol packet Commonly known as:  MIRALAX / GLYCOLAX Take 17 g by mouth daily.   rivaroxaban 10 MG Tabs tablet Commonly known as:  XARELTO Take 1 tablet (10 mg total) by mouth daily.   traMADol 50 MG tablet Commonly known as:  ULTRAM Take 1 tablet (50 mg total) by mouth every 6 (six) hours as needed for moderate pain.   valsartan 160 MG tablet Commonly known as:  DIOVAN Take 160 mg by mouth daily.       Diagnostic Studies: Dg Knee Right Port  Result Date: 06/13/2016 CLINICAL DATA:  Post right total knee replacement EXAM: PORTABLE RIGHT KNEE - 1-2 VIEW COMPARISON:  None. FINDINGS: The femoral and tibial components of the right total knee replacement appear to be in good position and alignment. No complicating features are seen. Air is noted in the soft tissues and joint space postoperatively. There is lucency overlying the distal right femur on the lateral view probably representing air in the soft tissues overlapping. IMPRESSION: Right total knee replacement components in good position. No complicating features. I did call this report as requested to the PACU at [facility] at the time of interpretation. Electronically Signed   By: Dwyane Dee M.D.   On: 06/13/2016 10:45    Disposition: 01-Home or Self Care  Discharge Instructions    Call MD / Call 911    Complete by:  As directed    If you experience chest pain or shortness of breath, CALL 911 and be transported to the  hospital emergency room.  If you develope a fever above 101 F, pus (white drainage) or increased drainage or redness at the wound, or calf pain, call your surgeon's office.   Constipation Prevention    Complete by:  As directed    Drink plenty of fluids.  Prune juice may be helpful.  You may use a stool  softener, such as Colace (over the counter) 100 mg twice a day.  Use MiraLax (over the counter) for constipation as needed.   Diet - low sodium heart healthy    Complete by:  As directed    Increase activity slowly as tolerated    Complete by:  As directed       Follow-up Information    BEANE,JEFFREY C, MD Follow up in 2 week(s).   Specialty:  Orthopedic Surgery Contact information: [address] [place] [address] [phone]        [facility] .   Why:  rolling walker and 3n1 Contact information: [address] [place] [address] [phone]        [facility] .   Specialty:  Home Health Services Why:  home health physical therapy Contact information: [address] [place] [place] [address] [phone]            Signed: Thea Gist 06/16/2016, 7:32 AM

## 2016-06-16 NOTE — Progress Notes (Signed)
   Subjective: 3 Days Post-Op Procedure(s) (LRB): RIGHT TOTAL KNEE ARTHROPLASTY (Right)  Pt doing well Ready for d/c  Pain has been moderate to severe but manageable Denies any new symptoms or issues Patient reports pain as moderate.  Objective:   VITALS:   Vitals:   06/15/16 2050 06/16/16 0458  BP: (!) 159/74 (!) 156/70  Pulse: 94 90  Resp: 18 20  Temp: 99.8 F (37.7 C) 98.5 F (36.9 C)    Right knee dressing intact No drainage or erythema nv intact distally Good rom  LABS  Recent Labs  06/14/16 0509 06/15/16 0541  HGB 12.5 12.3  HCT 37.2 37.7  WBC 15.7* 13.3*  PLT 283 282     Recent Labs  06/14/16 0509  NA 136  K 4.4  BUN 9  CREATININE 0.52  GLUCOSE 130*     Assessment/Plan: 3 Days Post-Op Procedure(s) (LRB): RIGHT TOTAL KNEE ARTHROPLASTY (Right) D/c home today F/u in 2 weeks Pain management as needed    Alphonsa OverallBrad Dixon, MPAS, PA-C  06/16/2016, 7:29 AM

## 2016-06-16 NOTE — Progress Notes (Signed)
Pt provided dc paper paper work and prescriptions. Escorted to lobby via wheelchair

## 2016-07-02 ENCOUNTER — Ambulatory Visit (INDEPENDENT_AMBULATORY_CARE_PROVIDER_SITE_OTHER): Payer: BLUE CROSS/BLUE SHIELD | Admitting: Physical Therapy

## 2016-07-02 ENCOUNTER — Encounter: Payer: Self-pay | Admitting: Physical Therapy

## 2016-07-02 DIAGNOSIS — M6281 Muscle weakness (generalized): Secondary | ICD-10-CM

## 2016-07-02 DIAGNOSIS — M25461 Effusion, right knee: Secondary | ICD-10-CM

## 2016-07-02 DIAGNOSIS — M25661 Stiffness of right knee, not elsewhere classified: Secondary | ICD-10-CM

## 2016-07-02 DIAGNOSIS — R2689 Other abnormalities of gait and mobility: Secondary | ICD-10-CM

## 2016-07-02 NOTE — Therapy (Signed)
[facility] [place] [address] [place], [place], [address] Phone: [phone]   Fax:  [phone]  Physical Therapy Evaluation  Patient Details  Name: Megan Petersen MRN: [id number] Date of Birth: 1960-10-16 Referring Provider: Dr Leandra Kern  Encounter Date: 07/02/2016      PT End of Session - 07/02/16 1021    Visit Number 1   Number of Visits 12   Date for PT Re-Evaluation 08/13/16   PT Start Time 1021   PT Stop Time 1115   PT Time Calculation (min) 54 min   Activity Tolerance Patient tolerated treatment well      Past Medical History:  Diagnosis Date  . Allergy   . Asthma   . Atypical glandular cells on Pap smear   . Esophageal reflux   . Family history of adverse reaction to anesthesia    brother slow to awaken  . GERD (gastroesophageal reflux disease)    Nexium controls  . Headache   . Hemorrhoids    history of- not a problem at present.  . Hyperlipidemia   . Hypertension   . Impaired fasting glucose    Dr. Jenny Reichmann -told pt Hgb A1C level was good.  Marland Kitchen LSIL (low grade squamous intraepithelial lesion) on Pap smear   . Morbid obesity (HCC)   . Osteoarthritis    Bilateral knees  . Osteoarthritis   . Pneumonia 2016  . Primary cough headache   . Rosacea   . Swelling of limb   . Transfusion history    '88- s/p childbirth  . Trigeminal neuralgia   . URI (upper respiratory infection)    cough with green sputum since 11-26-15  . Vertigo    10 yrs ago- only-    Past Surgical History:  Procedure Laterality Date  . CHOLECYSTECTOMY     history of gallstones  . LEEP    . right knee meniscus repair  2014  . SHOULDER OPEN ROTATOR CUFF REPAIR Right 12/01/2015   Procedure: RIGHT SHOULDER MINI OPEN ROTATOR CUFF REPAIR WITH RESECTON OF DISTAL CLAVICLE AND SUBACROMIAL DECOMPRESSION;  Surgeon: Jene Every, MD;  Location: [facility];  Service: Orthopedics;  Laterality: Right;  . TOTAL KNEE ARTHROPLASTY Right  06/13/2016   Procedure: RIGHT TOTAL KNEE ARTHROPLASTY;  Surgeon: Jene Every, MD;  Location: [facility];  Service: Orthopedics;  Laterality: Right;    There were no vitals filed for this visit.       Subjective Assessment - 07/02/16 1021    Subjective Chassie reports she had an elective Rt TKR 06/13/16 almost 3 weeks ago.  Ambulating with RW   How long can you walk comfortably? household with walker   Diagnostic tests MRI - two bone spurs that would lock up.    Patient Stated Goals sleep better, get rid of pain, walk normal.    Currently in Pain? Yes   Pain Score 3    Pain Location Knee   Pain Orientation Right   Pain Descriptors / Indicators Aching   Pain Type Surgical pain   Pain Onset 1 to 4 weeks ago   Pain Frequency Constant   Aggravating Factors  random with movement, being still to long   Pain Relieving Factors moving around, ice, medication            [facility] PT Assessment - 07/02/16 0001      Assessment   Medical Diagnosis Rt TKR   Referring Provider Dr Leandra Kern   Onset Date/Surgical Date 06/13/16  Hand Dominance Right   Next MD Visit 07/25/16   Prior Therapy not for knee     Precautions   Precautions None     Restrictions   Weight Bearing Restrictions --  WBAT     Balance Screen   Has the patient fallen in the past 6 months No   Has the patient had a decrease in activity level because of a fear of falling?  No   Is the patient reluctant to leave their home because of a fear of falling?  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, stand for 8 hrs, currently out of work due to surgery     Observation/Other Assessments   Observations  redness, heat around incision, steristrips in place    Focus on Therapeutic Outcomes (FOTO)  65% limited     Observation/Other Assessments-Edema    Edema Circumferential     Circumferential Edema   Circumferential - Right 51.6cm  knee, distal patella   Circumferential - Left  49.5     ROM / Strength   AROM / PROM / Strength AROM;PROM;Strength     AROM   AROM Assessment Site Knee   Right/Left Knee Left;Right   Right Knee Extension -10   Right Knee Flexion 80   Left Knee Extension -4   Left Knee Flexion 120     PROM   PROM Assessment Site Knee   Right/Left Knee Right   Right Knee Extension -6   Right Knee Flexion 88     Strength   Strength Assessment Site Hip;Knee;Ankle   Right/Left Hip Right   Right Hip Flexion 4+/5   Right Hip Extension 3-/5   Right Hip ABduction 3-/5  with pain   Right/Left Knee Right  Lt WNL   Right Knee Flexion 4+/5   Right Knee Extension 4/5   Right/Left Ankle --  bilat WNl     Palpation   Patella mobility hypomobile     Ambulation/Gait   Ambulation/Gait Yes   Ambulation/Gait Assistance 6: Modified independent (Device/Increase time)   Ambulation Distance (Feet) --  observed in gym   Assistive device Rolling walker   Gait Pattern Decreased step length - right;Decreased hip/knee flexion - right                   [facility] Adult PT Treatment/Exercise - 07/02/16 0001      Exercises   Exercises Knee/Hip     Knee/Hip Exercises: Seated   Long Arc Quad Strengthening;Right;10 reps  10 sec     Knee/Hip Exercises: Prone   Hamstring Curl 10 reps  Rt   Hip Extension Strengthening;Right;10 reps     Modalities   Modalities Electrical Stimulation;Vasopneumatic     Programme researcher, broadcasting/film/videolectrical Stimulation   Electrical Stimulation Location Rt knee   Electrical Stimulation Action ion repelling   Electrical Stimulation Parameters to tolerance   Electrical Stimulation Goals Edema     Vasopneumatic   Number Minutes Vasopneumatic  15 minutes    Vasopnuematic Location  Knee   Vasopneumatic Pressure Medium   Vasopneumatic Temperature  3*  PT Long Term Goals - 07/02/16 1257      PT LONG TERM GOAL #1   Title Independent with advanced HEP ( 08/13/16)   Time 6   Period Weeks   Status New     PT LONG TERM GOAL #2   Title improve Rt Knee ROM -4 extension to 110 flexion ( 12/19/170    Time 6   Period Weeks   Status New     PT LONG TERM GOAL #3   Title demo Rt LE strength =/> 5-/5 to allow alternating gait on stairs ( 08/13/16)    Time 6   Period Weeks   Status New     PT LONG TERM GOAL #4   Title report pain no greater than 2/10 with walking ( 08/13/16)    Time 6   Period Weeks   Status New     PT LONG TERM GOAL #5   Title improve FOTO =/< 49% limited ( 08/13/16)    Time 6   Period Weeks   Status New     Additional Long Term Goals   Additional Long Term Goals Yes     PT LONG TERM GOAL #6   Title ambulate without an assitive device an no gait devations on level surfaces. ( 08/13/16)    Time 6   Period Weeks   Status New               Plan - 07/02/16 1247    Clinical Impression Statement 55 yo ~ 3 wks s/p elective Rt TKA, completed HHPT last week.  Referred to [facility] to finish rehab and restore PLOF. She has stiffness in the Rt knee, LE weakness, abnormal gait, edema adn pain   Rehab Potential Excellent   PT Frequency 2x / week   PT Duration 6 weeks   PT Treatment/Interventions Moist Heat;Ultrasound;Therapeutic exercise;Dry needling;Taping;Vasopneumatic Device;Manual techniques;Neuromuscular re-education;Cryotherapy;Electrical Stimulation;Gait training;Stair training;Patient/family education;Scar mobilization   PT Next Visit Plan progress Rt knee ROM, LE ther ex, modalties PRN   Consulted and Agree with Plan of Care Patient      Patient will benefit from skilled therapeutic intervention in order to improve the following deficits and impairments:  Increased edema,  Decreased strength, Pain, Decreased range of motion, Difficulty walking, Decreased endurance  Visit Diagnosis: Muscle weakness (generalized) - Plan: PT plan of care cert/re-cert  Stiffness of right knee, not elsewhere classified - Plan: PT plan of care cert/re-cert  Swelling of joint, knee, right - Plan: PT plan of care cert/re-cert  Other abnormalities of gait and mobility - Plan: PT plan of care cert/re-cert     Problem List Patient Active Problem List   Diagnosis Date Noted  . Right knee DJD 06/13/2016  . Complete rotator cuff tear 12/01/2015  . Wheezing 04/07/2014  . Impaired fasting glucose 07/26/2013  . Essential hypertension, benign 07/26/2013  . Back pain 07/26/2013  . Knee pain, bilateral 01/10/2013  . Morbid obesity (HCC) 01/10/2013  . Headache(784.0) 12/06/2012    Roderic ScarceSusan Dalynn Jhaveri PT 07/02/2016, 1:05 PM  [facility] [address] [place], [place], [address] Phone: [phone]   Fax:  [phone]  Name: Rinaldo RatelRhonda R Depriest MRN: [id number] Date of Birth: 18-Sep-1960

## 2016-07-05 ENCOUNTER — Ambulatory Visit (INDEPENDENT_AMBULATORY_CARE_PROVIDER_SITE_OTHER): Payer: BLUE CROSS/BLUE SHIELD | Admitting: Physical Therapy

## 2016-07-05 DIAGNOSIS — M25661 Stiffness of right knee, not elsewhere classified: Secondary | ICD-10-CM | POA: Diagnosis not present

## 2016-07-05 DIAGNOSIS — M25461 Effusion, right knee: Secondary | ICD-10-CM | POA: Diagnosis not present

## 2016-07-05 DIAGNOSIS — M6281 Muscle weakness (generalized): Secondary | ICD-10-CM | POA: Diagnosis not present

## 2016-07-05 DIAGNOSIS — R2689 Other abnormalities of gait and mobility: Secondary | ICD-10-CM

## 2016-07-05 NOTE — Therapy (Signed)
[facility] [address] [place], [place], [address] Phone: [phone]   Fax:  [phone]  Physical Therapy Treatment  Patient Details  Name: Megan RatelRhonda R Petersen MRN: [id number] Date of Birth: [birth year] Referring Provider: Dr Leandra KernJeffery Beane  Encounter Date: 07/05/2016      PT End of Session - 07/05/16 1457    Visit Number 2   Number of Visits 12   Date for PT Re-Evaluation 08/13/16   PT Start Time 1452   PT Stop Time 1550   PT Time Calculation (min) 58 min   Activity Tolerance Patient tolerated treatment well      Past Medical History:  Diagnosis Date  . Allergy   . Asthma   . Atypical glandular cells on Pap smear   . Esophageal reflux   . Family history of adverse reaction to anesthesia    brother slow to awaken  . GERD (gastroesophageal reflux disease)    Nexium controls  . Headache   . Hemorrhoids    history of- not a problem at present.  . Hyperlipidemia   . Hypertension   . Impaired fasting glucose    Dr. Jenny ReichmannZanard,PCP -told pt Hgb A1C level was good.  Marland Kitchen. LSIL (low grade squamous intraepithelial lesion) on Pap smear   . Morbid obesity (HCC)   . Osteoarthritis    Bilateral knees  . Osteoarthritis   . Pneumonia 2016  . Primary cough headache   . Rosacea   . Swelling of limb   . Transfusion history    '88- s/p childbirth  . Trigeminal neuralgia   . URI (upper respiratory infection)    cough with green sputum since 11-26-15  . Vertigo    10 yrs ago- only-    Past Surgical History:  Procedure Laterality Date  . CHOLECYSTECTOMY     history of gallstones  . LEEP    . right knee meniscus repair  2014  . SHOULDER OPEN ROTATOR CUFF REPAIR Right 12/01/2015   Procedure: RIGHT SHOULDER MINI OPEN ROTATOR CUFF REPAIR WITH RESECTON OF DISTAL CLAVICLE AND SUBACROMIAL DECOMPRESSION;  Surgeon: Jene EveryJeffrey Beane, MD;  Location: [facility];  Service: Orthopedics;  Laterality: Right;  . TOTAL KNEE ARTHROPLASTY Right  06/13/2016   Procedure: RIGHT TOTAL KNEE ARTHROPLASTY;  Surgeon: Jene EveryJeffrey Beane, MD;  Location: [facility];  Service: Orthopedics;  Laterality: Right;    There were no vitals filed for this visit.      Subjective Assessment - 07/05/16 1454    Subjective Pt reports the night after the eval she had a pop in the medial Rt knee and had very bad pain and swelling, it is better now.  Some steri strips have come off.    Currently in Pain? Yes   Pain Score 3    Pain Location Knee   Pain Orientation Right                         [facility] Adult PT Treatment/Exercise - 07/05/16 0001      Knee/Hip Exercises: Stretches   Passive Hamstring Stretch Right   Gastroc Stretch Both;2 reps;30 seconds     Knee/Hip Exercises: Aerobic   Nustep L2 x 5' for ROM     Knee/Hip Exercises: Standing   Heel Raises Both;10 reps  on blue foam.    Knee Flexion AROM;Right;20 reps  stretch with foot up on 8" step.    Other Standing Knee Exercises 15 reps sit to/from stand  Knee/Hip Exercises: Seated   Long [employer] Strengthening;Right  with fitter, 2 blue bands.    Heel Slides Strengthening;Right  with fitter , 2 blue bands     Knee/Hip Exercises: Supine   Quad Sets Strengthening;Right;10 reps  5-10 sec     Modalities   Modalities Electrical Stimulation;Vasopneumatic     Programme researcher, broadcasting/film/video Location Rt knee   Electrical Stimulation Action ion repelling   Electrical Stimulation Parameters to tolerance   Electrical Stimulation Goals Edema     Vasopneumatic   Number Minutes Vasopneumatic  15 minutes   Vasopnuematic Location  Knee   Vasopneumatic Pressure Medium   Vasopneumatic Temperature  3*                PT Education - 07/05/16 1505    Education provided Yes   Education Details gastroc stetch   Person(s) Educated Patient   Methods Explanation;Demonstration;Handout   Comprehension Returned demonstration;Verbalized understanding              PT Long Term Goals - 07/02/16 1257      PT LONG TERM GOAL #1   Title Independent with advanced HEP ( 08/13/16)   Time 6   Period Weeks   Status New     PT LONG TERM GOAL #2   Title improve Rt Knee ROM -4 extension to 110 flexion ( 12/19/170    Time 6   Period Weeks   Status New     PT LONG TERM GOAL #3   Title demo Rt LE strength =/> 5-/5 to allow alternating gait on stairs ( 08/13/16)    Time 6   Period Weeks   Status New     PT LONG TERM GOAL #4   Title report pain no greater than 2/10 with walking ( 08/13/16)    Time 6   Period Weeks   Status New     PT LONG TERM GOAL #5   Title improve FOTO =/< 49% limited ( 08/13/16)    Time 6   Period Weeks   Status New     Additional Long Term Goals   Additional Long Term Goals Yes     PT LONG TERM GOAL #6   Title ambulate without an assitive device an no gait devations on level surfaces. ( 08/13/16)    Time 6   Period Weeks   Status New               Plan - 07/05/16 1536    Clinical Impression Statement [name] fatigued with ther ex today.  She is doing well with ROM. Her pain from the popping earlier this week has settled down and didn't interfere with treatment.    Rehab Potential Excellent   PT Frequency 2x / week   PT Duration 6 weeks   PT Treatment/Interventions Moist Heat;Ultrasound;Therapeutic exercise;Dry needling;Taping;Vasopneumatic Device;Manual techniques;Neuromuscular re-education;Cryotherapy;Electrical Stimulation;Gait training;Stair training;Patient/family education;Scar mobilization   PT Next Visit Plan  Begin gait training with cane, progress Rt knee ROM, LE ther ex, modalties PRN   Consulted and Agree with Plan of Care Patient      Patient will benefit from skilled therapeutic intervention in order to improve the following deficits and impairments:  Increased edema, Decreased strength, Pain, Decreased range of motion, Difficulty walking, Decreased endurance  Visit Diagnosis: Muscle weakness  (generalized)  Stiffness of right knee, not elsewhere classified  Swelling of joint, knee, right  Other abnormalities of gait and mobility     Problem List Patient  Active Problem List   Diagnosis Date Noted  . Right knee DJD 06/13/2016  . Complete rotator cuff tear 12/01/2015  . Wheezing 04/07/2014  . Impaired fasting glucose 07/26/2013  . Essential hypertension, benign 07/26/2013  . Back pain 07/26/2013  . Knee pain, bilateral 01/10/2013  . Morbid obesity (HCC) 01/10/2013  . Headache(784.0) 12/06/2012    Roderic ScarceSusan Karrie Fluellen PT  07/05/2016, 3:47 PM  [facility] [address] [place], [place], [address] Phone: [phone]   Fax:  [phone]  Name: Megan RatelRhonda R Petersen MRN: [id number] Date of Birth: 10/26/1960

## 2016-07-05 NOTE — Patient Instructions (Signed)
Achilles / Gastroc, Standing    Stand, right foot behind, heel on floor and turned slightly out, leg straight, forward leg bent. Move hips forward. Hold _30-45__ seconds. Repeat __1_ times per session. Do __1_ sessions per day.  Copyright  VHI. All rights reserved.   

## 2016-07-08 ENCOUNTER — Ambulatory Visit (INDEPENDENT_AMBULATORY_CARE_PROVIDER_SITE_OTHER): Payer: BLUE CROSS/BLUE SHIELD | Admitting: Physical Therapy

## 2016-07-08 DIAGNOSIS — M6281 Muscle weakness (generalized): Secondary | ICD-10-CM

## 2016-07-08 DIAGNOSIS — M25461 Effusion, right knee: Secondary | ICD-10-CM

## 2016-07-08 DIAGNOSIS — R2689 Other abnormalities of gait and mobility: Secondary | ICD-10-CM | POA: Diagnosis not present

## 2016-07-08 DIAGNOSIS — M25661 Stiffness of right knee, not elsewhere classified: Secondary | ICD-10-CM

## 2016-07-08 NOTE — Therapy (Signed)
[facility] [place] [address] [place], [place], [address] Phone: [phone]   Fax:  [phone]  Physical Therapy Treatment  Patient Details  Name: Megan RatelRhonda R Petersen MRN: [id number] Date of Birth: 12/14/1960 Referring Provider: Dr. Isaias CowmanJeffrey Bean   Encounter Date: 07/08/2016      PT End of Session - 07/08/16 1548    Visit Number 3   Number of Visits 12   Date for PT Re-Evaluation 08/13/16   PT Start Time 1520   PT Stop Time 1616   PT Time Calculation (min) 56 min   Activity Tolerance Patient tolerated treatment well      Past Medical History:  Diagnosis Date  . Allergy   . Asthma   . Atypical glandular cells on Pap smear   . Esophageal reflux   . Family history of adverse reaction to anesthesia    brother slow to awaken  . GERD (gastroesophageal reflux disease)    Nexium controls  . Headache   . Hemorrhoids    history of- not a problem at present.  . Hyperlipidemia   . Hypertension   . Impaired fasting glucose    Dr. Jenny ReichmannZanard,PCP -told pt Hgb A1C level was good.  Marland Kitchen. LSIL (low grade squamous intraepithelial lesion) on Pap smear   . Morbid obesity (HCC)   . Osteoarthritis    Bilateral knees  . Osteoarthritis   . Pneumonia 2016  . Primary cough headache   . Rosacea   . Swelling of limb   . Transfusion history    '88- s/p childbirth  . Trigeminal neuralgia   . URI (upper respiratory infection)    cough with green sputum since 11-26-15  . Vertigo    10 yrs ago- only-    Past Surgical History:  Procedure Laterality Date  . CHOLECYSTECTOMY     history of gallstones  . LEEP    . right knee meniscus repair  2014  . SHOULDER OPEN ROTATOR CUFF REPAIR Right 12/01/2015   Procedure: RIGHT SHOULDER MINI OPEN ROTATOR CUFF REPAIR WITH RESECTON OF DISTAL CLAVICLE AND SUBACROMIAL DECOMPRESSION;  Surgeon: Jene EveryJeffrey Beane, MD;  Location: [facility];  Service: Orthopedics;  Laterality: Right;  . TOTAL KNEE ARTHROPLASTY Right  06/13/2016   Procedure: RIGHT TOTAL KNEE ARTHROPLASTY;  Surgeon: Jene EveryJeffrey Beane, MD;  Location: [facility];  Service: Orthopedics;  Laterality: Right;    There were no vitals filed for this visit.      Subjective Assessment - 07/08/16 1530    Subjective Pt reports she is able to get her RLE into bed a little easier.    Currently in Pain? Yes   Pain Score 3    Pain Location Knee   Pain Orientation Right   Aggravating Factors  being still too much    Pain Relieving Factors moving around, ice, medication             [facility] - 07/08/16 0001      Assessment   Medical Diagnosis Rt TKR   Referring Provider Dr. Isaias CowmanJeffrey Bean    Onset Date/Surgical Date 06/13/16   Hand Dominance Right   Next MD Visit 07/25/16   Prior Therapy not for knee     AROM   Right/Left Knee Right   Right Knee Flexion 95           [facility] Adult PT Treatment/Exercise - 07/08/16 0001      Self-Care   Self-Care Other Self-Care Comments   Other Self-Care Comments  pt  educated on massage to area surrounding incision.  Pt verbalized understanding and returned demo.       Knee/Hip Exercises: Stretches   Passive Hamstring Stretch Right;2 reps;30 seconds   Gastroc Stretch Right;Left;3 reps;30 seconds     Knee/Hip Exercises: Aerobic   Nustep L3 x 5' for ROM     Knee/Hip Exercises: Standing   Lateral Step Up Right;1 set;10 reps;Hand Hold: 2;Step Height: 2"   Forward Step Up Right;1 set;15 reps;Hand Hold: 2;Step Height: 2"   Forward Step Up Limitations then reciprocal pattern up/ down x 10 steps.     Gait Training 160 ft with SPC, step through pattern. VC for increased Rt heel strike, knee bend during swing through and decreased Lt lateral lean; improved with VC and repetition.    Other Standing Knee Exercises 15 reps sit to/from stand     Knee/Hip Exercises: Seated   Other Seated Knee/Hip Exercises seated scoot x 10 sec hold x 8 reps.       Knee/Hip Exercises: Supine   Quad Sets  Strengthening;Right;1 set;5 reps   Heel Slides --  1 rep for measurement   Bridges Limitations 5 reps, then Rt hamstring cramped.      Modalities   Modalities Engineer, manufacturing systemslectrical Stimulation;Vasopneumatic     Electrical Stimulation   Electrical Stimulation Location Rt knee   Electrical Stimulation Action ion repelling    Electrical Stimulation Parameters to tolerance    Electrical Stimulation Goals Edema;Pain     Vasopneumatic   Number Minutes Vasopneumatic  15 minutes   Vasopnuematic Location  Knee   Vasopneumatic Pressure Medium   Vasopneumatic Temperature  3*                     PT Long Term Goals - 07/08/16 1620      PT LONG TERM GOAL #1   Title Independent with advanced HEP ( 08/13/16)   Time 6   Period Weeks   Status On-going     PT LONG TERM GOAL #2   Title improve Rt Knee ROM -4 extension to 110 flexion ( 12/19/170    Time 6   Period Weeks   Status On-going     PT LONG TERM GOAL #3   Title demo Rt LE strength =/> 5-/5 to allow alternating gait on stairs ( 08/13/16)    Time 6   Period Weeks   Status On-going     PT LONG TERM GOAL #4   Title report pain no greater than 2/10 with walking ( 08/13/16)    Time 6   Period Weeks   Status On-going     PT LONG TERM GOAL #5   Title improve FOTO =/< 49% limited ( 08/13/16)    Time 6   Period Weeks   Status On-going     PT LONG TERM GOAL #6   Title ambulate without an assistive device an no gait devations on level surfaces. ( 08/13/16)    Time 6   Period Weeks   Status On-going               Plan - 07/08/16 1622    Clinical Impression Statement Pt demonstrated improved Rt knee flexion ROM this date.  She had mild increase in knee pain with ther ex, however tolerated all exercises well.  Progressing towards goals.    Rehab Potential Excellent   PT Frequency 2x / week   PT Duration 6 weeks   PT Treatment/Interventions Moist Heat;Ultrasound;Therapeutic exercise;Dry needling;Taping;Vasopneumatic  Device;Manual  techniques;Neuromuscular re-education;Cryotherapy;Electrical Stimulation;Gait training;Stair training;Patient/family education;Scar mobilization   PT Next Visit Plan progress Rt knee ROM, LE ther ex, modalties PRN   Consulted and Agree with Plan of Care Patient      Patient will benefit from skilled therapeutic intervention in order to improve the following deficits and impairments:  Increased edema, Decreased strength, Pain, Decreased range of motion, Difficulty walking, Decreased endurance  Visit Diagnosis: Muscle weakness (generalized)  Stiffness of right knee, not elsewhere classified  Swelling of joint, knee, right  Other abnormalities of gait and mobility     Problem List Patient Active Problem List   Diagnosis Date Noted  . Right knee DJD 06/13/2016  . Complete rotator cuff tear 12/01/2015  . Wheezing 04/07/2014  . Impaired fasting glucose 07/26/2013  . Essential hypertension, benign 07/26/2013  . Back pain 07/26/2013  . Knee pain, bilateral 01/10/2013  . Morbid obesity (HCC) 01/10/2013  . Headache(784.0) 12/06/2012   Mayer Camel, PTA 07/08/16 4:24 PM  [facility] [place] [address] [place], [place], [address] Phone: [phone]   Fax:  [phone]  Name: ANEL CREIGHTON MRN: [id number] Date of Birth: 04/02/1961

## 2016-07-10 ENCOUNTER — Ambulatory Visit (INDEPENDENT_AMBULATORY_CARE_PROVIDER_SITE_OTHER): Payer: BLUE CROSS/BLUE SHIELD | Admitting: Physical Therapy

## 2016-07-10 DIAGNOSIS — M25461 Effusion, right knee: Secondary | ICD-10-CM

## 2016-07-10 DIAGNOSIS — M25661 Stiffness of right knee, not elsewhere classified: Secondary | ICD-10-CM | POA: Diagnosis not present

## 2016-07-10 DIAGNOSIS — M6281 Muscle weakness (generalized): Secondary | ICD-10-CM | POA: Diagnosis not present

## 2016-07-10 NOTE — Therapy (Signed)
[facility] [place] [address] [place], [place], [address] Phone: [phone]   Fax:  [phone]  Physical Therapy Treatment  Patient Details  Name: Megan Petersen MRN: [id number] Date of Birth: 03/21/1961 Referring Provider: Dr. Isaias Cowman  Encounter Date: 07/10/2016      PT End of Session - 07/10/16 1608    Visit Number 4   Number of Visits 12   Date for PT Re-Evaluation 08/13/16   PT Start Time 1604   PT Stop Time 1655   PT Time Calculation (min) 51 min   Activity Tolerance Patient limited by pain      Past Medical History:  Diagnosis Date  . Allergy   . Asthma   . Atypical glandular cells on Pap smear   . Esophageal reflux   . Family history of adverse reaction to anesthesia    brother slow to awaken  . GERD (gastroesophageal reflux disease)    Nexium controls  . Headache   . Hemorrhoids    history of- not a problem at present.  . Hyperlipidemia   . Hypertension   . Impaired fasting glucose    Dr. Jenny Reichmann -told pt Hgb A1C level was good.  Marland Kitchen LSIL (low grade squamous intraepithelial lesion) on Pap smear   . Morbid obesity (HCC)   . Osteoarthritis    Bilateral knees  . Osteoarthritis   . Pneumonia 2016  . Primary cough headache   . Rosacea   . Swelling of limb   . Transfusion history    '88- s/p childbirth  . Trigeminal neuralgia   . URI (upper respiratory infection)    cough with green sputum since 11-26-15  . Vertigo    10 yrs ago- only-    Past Surgical History:  Procedure Laterality Date  . CHOLECYSTECTOMY     history of gallstones  . LEEP    . right knee meniscus repair  2014  . SHOULDER OPEN ROTATOR CUFF REPAIR Right 12/01/2015   Procedure: RIGHT SHOULDER MINI OPEN ROTATOR CUFF REPAIR WITH RESECTON OF DISTAL CLAVICLE AND SUBACROMIAL DECOMPRESSION;  Surgeon: Jene Every, MD;  Location: [facility];  Service: Orthopedics;  Laterality: Right;  . TOTAL KNEE ARTHROPLASTY Right 06/13/2016   Procedure: RIGHT TOTAL KNEE ARTHROPLASTY;  Surgeon: Jene Every, MD;  Location: [facility];  Service: Orthopedics;  Laterality: Right;    There were no vitals filed for this visit.      Subjective Assessment - 07/10/16 1610    Subjective Pt reports her steri-strips have all fallen off.  Her Rt knee feels more painful today; not sure if it's the colder weather.    Currently in Pain? Yes   Pain Score 6    Pain Location Knee   Pain Orientation Right   Pain Descriptors / Indicators Sharp;Dull   Aggravating Factors  when she first gets up in morning    Pain Relieving Factors ice, medication            [facility] PT Assessment - 07/10/16 0001      Assessment   Medical Diagnosis Rt TKR   Referring Provider Dr. Isaias Cowman   Onset Date/Surgical Date 06/13/16   Hand Dominance Right   Next MD Visit 07/25/16   Prior Therapy not for knee     Circumferential Edema   Circumferential - Right 54.5cm   Circumferential - Left  52 cm     AROM   Right/Left Knee Right   Right Knee Extension -17   Right  Knee Flexion 95           [facility] Adult PT Treatment/Exercise - 07/10/16 0001      Knee/Hip Exercises: Aerobic   Nustep L4 x 5' for ROM     Knee/Hip Exercises: Standing   Heel Raises Both;1 set;10 reps   Forward Step Up Right;2 sets;10 reps;Hand Hold: 2;Step Height: 2";Step Height: 6"  (1 set-3", 1 set- 6")      Knee/Hip Exercises: Seated   Long Arc Quad Strengthening;Right;1 set;15 reps;Weights   Long Arc Quad Weight 3 lbs.     Knee/Hip Exercises: Supine   Quad Sets Strengthening;Right;1 set;10 reps   Bridges Limitations 10 reps, no cramping   Heel Prop for Knee Extension 2 minutes  with quad sets   Other Supine Knee/Hip Exercises ankle pumps with legs elevated.      Biomedical engineerlectrical Stimulation   Electrical Stimulation Location Rt knee    Electrical Stimulation Action ion repelling   Electrical Stimulation Parameters to tolerance    Electrical Stimulation Goals Pain;Edema      Vasopneumatic   Number Minutes Vasopneumatic  15 minutes   Vasopnuematic Location  Knee   Vasopneumatic Pressure Low   Vasopneumatic Temperature  3*                     PT Long Term Goals - 07/08/16 1620      PT LONG TERM GOAL #1   Title Independent with advanced HEP ( 08/13/16)   Time 6   Period Weeks   Status On-going     PT LONG TERM GOAL #2   Title improve Rt Knee ROM -4 extension to 110 flexion ( 12/19/170    Time 6   Period Weeks   Status On-going     PT LONG TERM GOAL #3   Title demo Rt LE strength =/> 5-/5 to allow alternating gait on stairs ( 08/13/16)    Time 6   Period Weeks   Status On-going     PT LONG TERM GOAL #4   Title report pain no greater than 2/10 with walking ( 08/13/16)    Time 6   Period Weeks   Status On-going     PT LONG TERM GOAL #5   Title improve FOTO =/< 49% limited ( 08/13/16)    Time 6   Period Weeks   Status On-going     PT LONG TERM GOAL #6   Title ambulate without an assistive device an no gait devations on level surfaces. ( 08/13/16)    Time 6   Period Weeks   Status On-going               Plan - 07/10/16 1623    Clinical Impression Statement Pt ambulated into and during therapy with SPC and step through gate; improved from last session. She presents with increased swelling and redness in Rt knee, along with increased pain.   Exercise tolerance was decreased.  Pain decreased with use of vaso/estim at end of session.  Encouraged pt to elevate and ice to assist with edema reduction.     Rehab Potential Excellent   PT Frequency 2x / week   PT Duration 6 weeks   PT Treatment/Interventions Moist Heat;Ultrasound;Therapeutic exercise;Dry needling;Taping;Vasopneumatic Device;Manual techniques;Neuromuscular re-education;Cryotherapy;Electrical Stimulation;Gait training;Stair training;Patient/family education;Scar mobilization   PT Next Visit Plan progress Rt knee ROM, LE ther ex, modalties PRN. Trial of Rock tape if  indicated, for edema reduction.    Consulted and Agree with Plan of Care  Patient      Patient will benefit from skilled therapeutic intervention in order to improve the following deficits and impairments:  Increased edema, Decreased strength, Pain, Decreased range of motion, Difficulty walking, Decreased endurance  Visit Diagnosis: Muscle weakness (generalized)  Stiffness of right knee, not elsewhere classified  Swelling of joint, knee, right     Problem List Patient Active Problem List   Diagnosis Date Noted  . Right knee DJD 06/13/2016  . Complete rotator cuff tear 12/01/2015  . Wheezing 04/07/2014  . Impaired fasting glucose 07/26/2013  . Essential hypertension, benign 07/26/2013  . Back pain 07/26/2013  . Knee pain, bilateral 01/10/2013  . Morbid obesity (HCC) 01/10/2013  . Headache(784.0) 12/06/2012   Mayer CamelJennifer Carlson-Long, PTA 07/10/16 4:53 PM  [facility] [place] [address] [place], [place], [address] Phone: [phone]   Fax:  [phone]  Name: [name] MRN: [id number] Date of Birth: April 13, 1961

## 2016-07-12 ENCOUNTER — Encounter: Payer: BLUE CROSS/BLUE SHIELD | Admitting: Physical Therapy

## 2016-07-15 ENCOUNTER — Ambulatory Visit (INDEPENDENT_AMBULATORY_CARE_PROVIDER_SITE_OTHER): Payer: BLUE CROSS/BLUE SHIELD | Admitting: Physical Therapy

## 2016-07-15 DIAGNOSIS — M6281 Muscle weakness (generalized): Secondary | ICD-10-CM

## 2016-07-15 DIAGNOSIS — M25661 Stiffness of right knee, not elsewhere classified: Secondary | ICD-10-CM

## 2016-07-15 DIAGNOSIS — M25461 Effusion, right knee: Secondary | ICD-10-CM

## 2016-07-15 NOTE — Therapy (Signed)
[facility] [place] [address] [place], [place], [address] Phone: [phone]   Fax:  [phone]  Physical Therapy Treatment  Patient Details  Name: Megan RatelRhonda R Pulice MRN: [id number] Date of Birth: [birth year] Referring Provider: Dr. Isaias CowmanJeffrey Bean   Encounter Date: 07/15/2016      PT End of Session - 07/15/16 1602    Visit Number 5   Number of Visits 12   Date for PT Re-Evaluation 08/13/16   PT Start Time 1520   PT Stop Time 1614   PT Time Calculation (min) 54 min      Past Medical History:  Diagnosis Date  . Allergy   . Asthma   . Atypical glandular cells on Pap smear   . Esophageal reflux   . Family history of adverse reaction to anesthesia    brother slow to awaken  . GERD (gastroesophageal reflux disease)    Nexium controls  . Headache   . Hemorrhoids    history of- not a problem at present.  . Hyperlipidemia   . Hypertension   . Impaired fasting glucose    Dr. Jenny ReichmannZanard,PCP -told pt Hgb A1C level was good.  Marland Kitchen. LSIL (low grade squamous intraepithelial lesion) on Pap smear   . Morbid obesity (HCC)   . Osteoarthritis    Bilateral knees  . Osteoarthritis   . Pneumonia 2016  . Primary cough headache   . Rosacea   . Swelling of limb   . Transfusion history    '88- s/p childbirth  . Trigeminal neuralgia   . URI (upper respiratory infection)    cough with green sputum since 11-26-15  . Vertigo    10 yrs ago- only-    Past Surgical History:  Procedure Laterality Date  . CHOLECYSTECTOMY     history of gallstones  . LEEP    . right knee meniscus repair  2014  . SHOULDER OPEN ROTATOR CUFF REPAIR Right 12/01/2015   Procedure: RIGHT SHOULDER MINI OPEN ROTATOR CUFF REPAIR WITH RESECTON OF DISTAL CLAVICLE AND SUBACROMIAL DECOMPRESSION;  Surgeon: Jene EveryJeffrey Beane, MD;  Location: [facility];  Service: Orthopedics;  Laterality: Right;  . TOTAL KNEE ARTHROPLASTY Right 06/13/2016   Procedure: RIGHT TOTAL KNEE ARTHROPLASTY;   Surgeon: Jene EveryJeffrey Beane, MD;  Location: [facility];  Service: Orthopedics;  Laterality: Right;    There were no vitals filed for this visit.      Subjective Assessment - 07/15/16 1527    Subjective Pt reports she wore her TED hose and now her leg is less painful and swollen.  Her main complaint is stiffness.  Sleeping is improving a little.    Currently in Pain? Yes   Pain Score 5    Pain Location Knee   Pain Orientation Left   Pain Descriptors / Indicators Sharp   Aggravating Factors  bending it.    Pain Relieving Factors pain patch    Multiple Pain Sites Yes   Pain Score 2   Pain Location Knee   Pain Orientation Right   Pain Descriptors / Indicators Aching;Dull   Aggravating Factors  prolonged sitting    Pain Relieving Factors getting up and moving around, ice.             [facility] - 07/15/16 0001      Assessment   Medical Diagnosis Rt TKR   Referring Provider Dr. Isaias CowmanJeffrey Bean    Onset Date/Surgical Date 06/13/16   Hand Dominance Right   Next MD Visit 07/25/16  Prior Therapy not for knee     AROM   Right/Left Knee Right   Right Knee Extension -17   Right Knee Flexion 100           [facility] Adult PT Treatment/Exercise - 07/15/16 0001      Knee/Hip Exercises: Stretches   Quad Stretch Right;3 reps;30 seconds   Gastroc Stretch Right;Left;3 reps;30 seconds     Knee/Hip Exercises: Aerobic   Nustep L4 x 5' for ROM     Knee/Hip Exercises: Seated   Heel Slides Strengthening;Right;10 reps;2 sets  with fitter , 2 blue bands, then 1 black band.    Knee/Hip Flexion x 25 reps with blue band. (simulate driving)    Other Seated Knee/Hip Exercises seated scoot x 10 sec hold x 8 reps.     Hamstring Curl Strengthening;Right;2 sets;10 reps  blue band     Knee/Hip Exercises: Supine   Quad Sets Strengthening;Right;1 set;10 reps     Programme researcher, broadcasting/film/videolectrical Stimulation   Electrical Stimulation Location Rt knee    Product/process development scientistlectrical Stimulation Action ion repelling   Electrical  Stimulation Parameters  to tolerance    Electrical Stimulation Goals Tone;Pain     Vasopneumatic   Number Minutes Vasopneumatic  15 minutes   Vasopnuematic Location  Knee   Vasopneumatic Pressure Low   Vasopneumatic Temperature  3*                     PT Long Term Goals - 07/08/16 1620      PT LONG TERM GOAL #1   Title Independent with advanced HEP ( 08/13/16)   Time 6   Period Weeks   Status On-going     PT LONG TERM GOAL #2   Title improve Rt Knee ROM -4 extension to 110 flexion ( 12/19/170    Time 6   Period Weeks   Status On-going     PT LONG TERM GOAL #3   Title demo Rt LE strength =/> 5-/5 to allow alternating gait on stairs ( 08/13/16)    Time 6   Period Weeks   Status On-going     PT LONG TERM GOAL #4   Title report pain no greater than 2/10 with walking ( 08/13/16)    Time 6   Period Weeks   Status On-going     PT LONG TERM GOAL #5   Title improve FOTO =/< 49% limited ( 08/13/16)    Time 6   Period Weeks   Status On-going     PT LONG TERM GOAL #6   Title ambulate without an assistive device an no gait devations on level surfaces. ( 08/13/16)    Time 6   Period Weeks   Status On-going               Plan - 07/15/16 1653    Clinical Impression Statement Pt demonstrated improved Rt knee flexion ROM.  Pt still lacks 17 deg ext ROM in Rt knee.  Pt tolerated seated exercises well; held standing exercises due to increased Lt knee pain.  Pt progressing gradually towards goals.    Rehab Potential Excellent   PT Frequency 2x / week   PT Duration 6 weeks   PT Treatment/Interventions Moist Heat;Ultrasound;Therapeutic exercise;Dry needling;Taping;Vasopneumatic Device;Manual techniques;Neuromuscular re-education;Cryotherapy;Electrical Stimulation;Gait training;Stair training;Patient/family education;Scar mobilization   PT Next Visit Plan progress Rt knee ROM, LE ther ex, modalties PRN. Trial of Rock tape if indicated, for edema reduction.     Consulted and Agree with Plan of Care  Patient      Patient will benefit from skilled therapeutic intervention in order to improve the following deficits and impairments:  Increased edema, Decreased strength, Pain, Decreased range of motion, Difficulty walking, Decreased endurance  Visit Diagnosis: Muscle weakness (generalized)  Stiffness of right knee, not elsewhere classified  Swelling of joint, knee, right     Problem List Patient Active Problem List   Diagnosis Date Noted  . Right knee DJD 06/13/2016  . Complete rotator cuff tear 12/01/2015  . Wheezing 04/07/2014  . Impaired fasting glucose 07/26/2013  . Essential hypertension, benign 07/26/2013  . Back pain 07/26/2013  . Knee pain, bilateral 01/10/2013  . Morbid obesity (HCC) 01/10/2013  . Headache(784.0) 12/06/2012   Mayer Camel, PTA 07/15/16 4:55 PM    [facility] [place] [address] [place], [place], [address] Phone: [phone]   Fax:  [phone]  Name: JASSLYN FINKEL MRN: [id number] Date of Birth: 12/26/1960

## 2016-07-17 ENCOUNTER — Ambulatory Visit (INDEPENDENT_AMBULATORY_CARE_PROVIDER_SITE_OTHER): Payer: BLUE CROSS/BLUE SHIELD | Admitting: Physical Therapy

## 2016-07-17 DIAGNOSIS — R2689 Other abnormalities of gait and mobility: Secondary | ICD-10-CM | POA: Diagnosis not present

## 2016-07-17 DIAGNOSIS — M25461 Effusion, right knee: Secondary | ICD-10-CM

## 2016-07-17 DIAGNOSIS — M25661 Stiffness of right knee, not elsewhere classified: Secondary | ICD-10-CM

## 2016-07-17 DIAGNOSIS — M6281 Muscle weakness (generalized): Secondary | ICD-10-CM | POA: Diagnosis not present

## 2016-07-17 NOTE — Therapy (Signed)
[facility] [address] [place] [address] [place], [place], [address] Phone: [phone]   Fax:  [phone]  Physical Therapy Treatment  Patient Details  Name: Megan RatelRhonda R Petersen MRN: [id number] Date of Birth: 23-Oct-1960 Referring Provider: Dr. Isaias CowmanJeffrey Bean   Encounter Date: 07/17/2016      PT End of Session - 07/17/16 0803    Visit Number 6   Number of Visits 12   Date for PT Re-Evaluation 08/13/16   PT Start Time 0718   PT Stop Time 0816   PT Time Calculation (min) 58 min   Activity Tolerance Patient tolerated treatment well   Behavior During Therapy [facility] for tasks assessed/performed      Past Medical History:  Diagnosis Date  . Allergy   . Asthma   . Atypical glandular cells on Pap smear   . Esophageal reflux   . Family history of adverse reaction to anesthesia    brother slow to awaken  . GERD (gastroesophageal reflux disease)    Nexium controls  . Headache   . Hemorrhoids    history of- not a problem at present.  . Hyperlipidemia   . Hypertension   . Impaired fasting glucose    Dr. Jenny ReichmannZanard,PCP -told pt Hgb A1C level was good.  Marland Kitchen. LSIL (low grade squamous intraepithelial lesion) on Pap smear   . Morbid obesity (HCC)   . Osteoarthritis    Bilateral knees  . Osteoarthritis   . Pneumonia 2016  . Primary cough headache   . Rosacea   . Swelling of limb   . Transfusion history    '88- s/p childbirth  . Trigeminal neuralgia   . URI (upper respiratory infection)    cough with green sputum since 11-26-15  . Vertigo    10 yrs ago- only-    Past Surgical History:  Procedure Laterality Date  . CHOLECYSTECTOMY     history of gallstones  . LEEP    . right knee meniscus repair  2014  . SHOULDER OPEN ROTATOR CUFF REPAIR Right 12/01/2015   Procedure: RIGHT SHOULDER MINI OPEN ROTATOR CUFF REPAIR WITH RESECTON OF DISTAL CLAVICLE AND SUBACROMIAL DECOMPRESSION;  Surgeon: Jene EveryJeffrey Beane, MD;  Location: [facility];  Service: Orthopedics;   Laterality: Right;  . TOTAL KNEE ARTHROPLASTY Right 06/13/2016   Procedure: RIGHT TOTAL KNEE ARTHROPLASTY;  Surgeon: Jene EveryJeffrey Beane, MD;  Location: [facility];  Service: Orthopedics;  Laterality: Right;    There were no vitals filed for this visit.      Subjective Assessment - 07/17/16 0724    Subjective "I think my ( Rt knee) bending is getting better".      Currently in Pain? Yes   Pain Score 2    Pain Location Knee   Pain Orientation Left;Right   Pain Descriptors / Indicators Aching   Aggravating Factors  bending it.    Pain Relieving Factors ice, pain patch            [facility] PT Assessment - 07/17/16 0001      Assessment   Medical Diagnosis Rt TKR   Referring Provider Dr. Isaias CowmanJeffrey Bean    Onset Date/Surgical Date 06/13/16   Hand Dominance Right   Next MD Visit 07/25/16   Prior Therapy not for knee     AROM   Right/Left Knee Right   Right Knee Extension -14           [facility] Adult PT Treatment/Exercise - 07/17/16 0001      Knee/Hip Exercises: Stretches  Quad Stretch Right;Left;3 reps;30 seconds   Gastroc Stretch Right;Left;2 reps;60 seconds     Knee/Hip Exercises: Aerobic   Nustep L4 x 5'      Knee/Hip Exercises: Standing   Lateral Step Up Right;1 set;10 reps;Hand Hold: 2;Step Height: 6"   Forward Step Up Right;2 sets;Hand Hold: 2;Step Height: 6";10 reps     Knee/Hip Exercises: Seated   Sit to Sand 10 reps;without UE support     Knee/Hip Exercises: Prone   Hamstring Curl 10 reps  Rt   Prone Knee Hang 2 minutes  3 reps     Modalities   Modalities Cryotherapy;Moist Heat;Electrical Stimulation     Moist Heat Therapy   Number Minutes Moist Heat 15 Minutes   Moist Heat Location Knee  posterior     Cryotherapy   Number Minutes Cryotherapy 15 Minutes   Cryotherapy Location Knee  anterior   Type of Cryotherapy Ice pack     Electrical Stimulation   Electrical Stimulation Location Rt knee (ant/post)   Electrical Stimulation Action IFC    Electrical  Stimulation Parameters to tolerance    Electrical Stimulation Goals Tone;Pain     Manual Therapy   Manual Therapy Passive ROM;Taping   Manual therapy comments Rock tape applied to Rt knee in web pattern to assist in edema reduction and scar management    Passive ROM Rt knee overpressure into ext with ankle on bolster.                      PT Long Term Goals - 07/08/16 1620      PT LONG TERM GOAL #1   Title Independent with advanced HEP ( 08/13/16)   Time 6   Period Weeks   Status On-going     PT LONG TERM GOAL #2   Title improve Rt Knee ROM -4 extension to 110 flexion ( 12/19/170    Time 6   Period Weeks   Status On-going     PT LONG TERM GOAL #3   Title demo Rt LE strength =/> 5-/5 to allow alternating gait on stairs ( 08/13/16)    Time 6   Period Weeks   Status On-going     PT LONG TERM GOAL #4   Title report pain no greater than 2/10 with walking ( 08/13/16)    Time 6   Period Weeks   Status On-going     PT LONG TERM GOAL #5   Title improve FOTO =/< 49% limited ( 08/13/16)    Time 6   Period Weeks   Status On-going     PT LONG TERM GOAL #6   Title ambulate without an assistive device an no gait devations on level surfaces. ( 08/13/16)    Time 6   Period Weeks   Status On-going               Plan - 07/17/16 1149    Rehab Potential Excellent   PT Frequency 2x / week   PT Duration 6 weeks   PT Treatment/Interventions Moist Heat;Ultrasound;Therapeutic exercise;Dry needling;Taping;Vasopneumatic Device;Manual techniques;Neuromuscular re-education;Cryotherapy;Electrical Stimulation;Gait training;Stair training;Patient/family education;Scar mobilization   PT Next Visit Plan progress Rt knee ROM, LE ther ex, modalties PRN. assess response to Rock tape.    Consulted and Agree with Plan of Care Patient      Patient will benefit from skilled therapeutic intervention in order to improve the following deficits and impairments:  Increased edema,  Decreased strength, Pain, Decreased range of motion, Difficulty walking,  Decreased endurance  Visit Diagnosis: Muscle weakness (generalized)  Stiffness of right knee, not elsewhere classified  Swelling of joint, knee, right  Other abnormalities of gait and mobility     Problem List Patient Active Problem List   Diagnosis Date Noted  . Right knee DJD 06/13/2016  . Complete rotator cuff tear 12/01/2015  . Wheezing 04/07/2014  . Impaired fasting glucose 07/26/2013  . Essential hypertension, benign 07/26/2013  . Back pain 07/26/2013  . Knee pain, bilateral 01/10/2013  . Morbid obesity (HCC) 01/10/2013  . Headache(784.0) 12/06/2012   Mayer Camel, PTA 07/17/16 11:50 AM  [facility] [address] [place] [address] [place], [place], [address] Phone: [phone]   Fax:  [phone]  Name: KASMIRA CACIOPPO MRN: [id number] Date of Birth: 04-Jan-1961

## 2016-07-22 ENCOUNTER — Encounter: Payer: Self-pay | Admitting: Physical Therapy

## 2016-07-22 ENCOUNTER — Ambulatory Visit (INDEPENDENT_AMBULATORY_CARE_PROVIDER_SITE_OTHER): Payer: BLUE CROSS/BLUE SHIELD | Admitting: Physical Therapy

## 2016-07-22 DIAGNOSIS — M25661 Stiffness of right knee, not elsewhere classified: Secondary | ICD-10-CM | POA: Diagnosis not present

## 2016-07-22 DIAGNOSIS — M25461 Effusion, right knee: Secondary | ICD-10-CM

## 2016-07-22 DIAGNOSIS — R2689 Other abnormalities of gait and mobility: Secondary | ICD-10-CM | POA: Diagnosis not present

## 2016-07-22 DIAGNOSIS — M6281 Muscle weakness (generalized): Secondary | ICD-10-CM

## 2016-07-22 NOTE — Therapy (Signed)
[facility] [address] [place] [address] [place], [place], [address] Phone: [phone]   Fax:  [phone]  Physical Therapy Treatment  Patient Details  Name: Megan Petersen MRN: [id number] Date of Birth: Jul 05, 1961 Referring Provider: Dr. Isaias CowmanJeffrey Bean   Encounter Date: 07/22/2016      PT End of Session - 07/22/16 1710    Visit Number 7   Number of Visits 12   Date for PT Re-Evaluation 08/13/16   PT Start Time 1606   PT Stop Time 1708   PT Time Calculation (min) 62 min   Activity Tolerance Patient tolerated treatment well   Behavior During Therapy [facility] for tasks assessed/performed      Past Medical History:  Diagnosis Date  . Allergy   . Asthma   . Atypical glandular cells on Pap smear   . Esophageal reflux   . Family history of adverse reaction to anesthesia    brother slow to awaken  . GERD (gastroesophageal reflux disease)    Nexium controls  . Headache   . Hemorrhoids    history of- not a problem at present.  . Hyperlipidemia   . Hypertension   . Impaired fasting glucose    Dr. Jenny ReichmannZanard,PCP -told pt Hgb A1C level was good.  Marland Kitchen. LSIL (low grade squamous intraepithelial lesion) on Pap smear   . Morbid obesity (HCC)   . Osteoarthritis    Bilateral knees  . Osteoarthritis   . Pneumonia 2016  . Primary cough headache   . Rosacea   . Swelling of limb   . Transfusion history    '88- s/p childbirth  . Trigeminal neuralgia   . URI (upper respiratory infection)    cough with green sputum since 11-26-15  . Vertigo    10 yrs ago- only-    Past Surgical History:  Procedure Laterality Date  . CHOLECYSTECTOMY     history of gallstones  . LEEP    . right knee meniscus repair  2014  . SHOULDER OPEN ROTATOR CUFF REPAIR Right 12/01/2015   Procedure: RIGHT SHOULDER MINI OPEN ROTATOR CUFF REPAIR WITH RESECTON OF DISTAL CLAVICLE AND SUBACROMIAL DECOMPRESSION;  Surgeon: Jene EveryJeffrey Beane, MD;  Location: [facility];  Service: Orthopedics;   Laterality: Right;  . TOTAL KNEE ARTHROPLASTY Right 06/13/2016   Procedure: RIGHT TOTAL KNEE ARTHROPLASTY;  Surgeon: Jene EveryJeffrey Beane, MD;  Location: [facility];  Service: Orthopedics;  Laterality: Right;    There were no vitals filed for this visit.      Subjective Assessment - 07/22/16 1613    Subjective Doing pretty good really. When she took the tape off earlier and she noticed some drainage from the knee at the bottom of the incision. No drainage since then. Only taking the pain pills at night.    Currently in Pain? Yes   Pain Score 2    Pain Location Knee   Pain Orientation Right;Left   Pain Descriptors / Indicators Aching            [facility] PT Assessment - 07/22/16 0001      AROM   Right/Left Knee Right   Right Knee Extension -11   Right Knee Flexion 105  supine                     [facility] Adult PT Treatment/Exercise - 07/22/16 0001      Knee/Hip Exercises: Aerobic   Nustep L5 x 5'      Knee/Hip Exercises: Seated   Long Arc  Quad Strengthening;Right;2 sets;10 reps   Long [employer] Quad Limitations manual resistance   Heel Slides AAROM;Right   Heel Slides Limitations knee flexion stretch with endrange hold   Hamstring Curl Strengthening;Right;2 sets   Hamstring Limitations manual resistance     Knee/Hip Exercises: Supine   Heel Slides Right;10 reps   Heel Slides Limitations endrange hold     Modalities   Modalities Cryotherapy;Electrical Stimulation     Cryotherapy   Number Minutes Cryotherapy 15 Minutes   Cryotherapy Location --  anterior/posterior   Type of Cryotherapy Ice pack     Electrical Stimulation   Electrical Stimulation Location Rt knee (ant/post)   Electrical Stimulation Action IFC   Electrical Stimulation Parameters to tolerance X15 min   Electrical Stimulation Goals Tone;Pain     Manual Therapy   Manual therapy comments patellar mobs sup/inf                PT Education - 07/22/16 1657    Education provided Yes   Education  Details review of need for knee extension stretch   Person(s) Educated Patient   Methods Explanation;Tactile cues;Verbal cues;Demonstration   Comprehension Verbalized understanding             PT Long Term Goals - 07/22/16 1714      PT LONG TERM GOAL #1   Title Independent with advanced HEP ( 08/13/16)   Time 6   Period Weeks   Status On-going     PT LONG TERM GOAL #2   Title improve Rt Knee ROM -4 extension to 110 flexion ( 12/19/170    Time 6   Period Weeks   Status On-going     PT LONG TERM GOAL #3   Title demo Rt LE strength =/> 5-/5 to allow alternating gait on stairs ( 08/13/16)    Time 6   Period Weeks   Status On-going     PT LONG TERM GOAL #4   Title report pain no greater than 2/10 with walking ( 08/13/16)    Time 6   Period Weeks   Status On-going     PT LONG TERM GOAL #5   Title improve FOTO =/< 49% limited ( 08/13/16)    Time 6   Period Weeks   Status On-going     PT LONG TERM GOAL #6   Title ambulate without an assistive device an no gait devations on level surfaces. ( 08/13/16)    Time 6   Period Weeks   Status On-going               Plan - 07/22/16 1711    Clinical Impression Statement Observation of distal incision is negative for any drainage and minimal erythema. Will continue to monitor during sessions. Pt able to increase with her knee flexion and extension but continues to have difficulty especially with knee extension. Stressed need to work on stretch at home. Pt verbalized understanding and admits to not performing extension hang as needed. Continue with skilled PT.    Rehab Potential Excellent   PT Frequency 2x / week   PT Duration 6 weeks   PT Treatment/Interventions Moist Heat;Ultrasound;Therapeutic exercise;Dry needling;Taping;Vasopneumatic Device;Manual techniques;Neuromuscular re-education;Cryotherapy;Electrical Stimulation;Gait training;Stair training;Patient/family education;Scar mobilization   PT Next Visit Plan  progress Rt knee ROM, LE ther ex, modalties PRN. Check distal incision. Encourage knee extension stretch at home    Consulted and Agree with Plan of Care Patient      Patient will benefit from skilled therapeutic intervention in order  to improve the following deficits and impairments:  Increased edema, Decreased strength, Pain, Decreased range of motion, Difficulty walking, Decreased endurance  Visit Diagnosis: Muscle weakness (generalized)  Stiffness of right knee, not elsewhere classified  Swelling of joint, knee, right  Other abnormalities of gait and mobility     Problem List Patient Active Problem List   Diagnosis Date Noted  . Right knee DJD 06/13/2016  . Complete rotator cuff tear 12/01/2015  . Wheezing 04/07/2014  . Impaired fasting glucose 07/26/2013  . Essential hypertension, benign 07/26/2013  . Back pain 07/26/2013  . Knee pain, bilateral 01/10/2013  . Morbid obesity (HCC) 01/10/2013  . Headache(784.0) 12/06/2012    Delton See, PT, CSCS 07/22/2016, 5:16 PM  [facility] [address] [place] [address] [place], [place], [address] Phone: [phone]   Fax:  [phone]  Name: Megan Petersen MRN: [id number] Date of Birth: Jun 19, 1961

## 2016-07-24 ENCOUNTER — Ambulatory Visit (INDEPENDENT_AMBULATORY_CARE_PROVIDER_SITE_OTHER): Payer: BLUE CROSS/BLUE SHIELD | Admitting: Physical Therapy

## 2016-07-24 ENCOUNTER — Encounter: Payer: Self-pay | Admitting: Physical Therapy

## 2016-07-24 DIAGNOSIS — M25461 Effusion, right knee: Secondary | ICD-10-CM | POA: Diagnosis not present

## 2016-07-24 DIAGNOSIS — M25661 Stiffness of right knee, not elsewhere classified: Secondary | ICD-10-CM

## 2016-07-24 DIAGNOSIS — M6281 Muscle weakness (generalized): Secondary | ICD-10-CM

## 2016-07-24 DIAGNOSIS — R2689 Other abnormalities of gait and mobility: Secondary | ICD-10-CM

## 2016-07-24 NOTE — Therapy (Signed)
[facility] [address] [place] [address] [place], [place], [address] Phone: [phone]   Fax:  [phone]  Physical Therapy Treatment  Patient Details  Name: Megan Petersen MRN: [id number] Date of Birth: 09/08/1960 Referring Provider: Dr. Isaias CowmanJeffrey Bean   Encounter Date: 07/24/2016      PT End of Session - 07/24/16 1700    Visit Number 8   Number of Visits 12   Date for PT Re-Evaluation 08/13/16   PT Start Time 1601   PT Stop Time 1645   PT Time Calculation (min) 44 min   Activity Tolerance Patient tolerated treatment well   Behavior During Therapy [facility] for tasks assessed/performed      Past Medical History:  Diagnosis Date  . Allergy   . Asthma   . Atypical glandular cells on Pap smear   . Esophageal reflux   . Family history of adverse reaction to anesthesia    brother slow to awaken  . GERD (gastroesophageal reflux disease)    Nexium controls  . Headache   . Hemorrhoids    history of- not a problem at present.  . Hyperlipidemia   . Hypertension   . Impaired fasting glucose    Dr. Jenny ReichmannZanard,PCP -told pt Hgb A1C level was good.  Marland Kitchen. LSIL (low grade squamous intraepithelial lesion) on Pap smear   . Morbid obesity (HCC)   . Osteoarthritis    Bilateral knees  . Osteoarthritis   . Pneumonia 2016  . Primary cough headache   . Rosacea   . Swelling of limb   . Transfusion history    '88- s/p childbirth  . Trigeminal neuralgia   . URI (upper respiratory infection)    cough with green sputum since 11-26-15  . Vertigo    10 yrs ago- only-    Past Surgical History:  Procedure Laterality Date  . CHOLECYSTECTOMY     history of gallstones  . LEEP    . right knee meniscus repair  2014  . SHOULDER OPEN ROTATOR CUFF REPAIR Right 12/01/2015   Procedure: RIGHT SHOULDER MINI OPEN ROTATOR CUFF REPAIR WITH RESECTON OF DISTAL CLAVICLE AND SUBACROMIAL DECOMPRESSION;  Surgeon: Jene EveryJeffrey Beane, MD;  Location: [facility];  Service: Orthopedics;   Laterality: Right;  . TOTAL KNEE ARTHROPLASTY Right 06/13/2016   Procedure: RIGHT TOTAL KNEE ARTHROPLASTY;  Surgeon: Jene EveryJeffrey Beane, MD;  Location: [facility];  Service: Orthopedics;  Laterality: Right;    There were no vitals filed for this visit.      Subjective Assessment - 07/24/16 1610    Subjective Doing okay today, knee is getting better slowly. Bottom of incision is looking better.    Currently in Pain? Yes   Pain Score 3    Pain Location Knee   Pain Orientation Right   Pain Descriptors / Indicators Aching   Pain Type Surgical pain                         [facility] Adult PT Treatment/Exercise - 07/24/16 0001      Ambulation/Gait   Gait Comments using SPC for ambulation     Knee/Hip Exercises: Aerobic   Recumbent Bike L0 X 5 min (able to do full revolutions FWD/BKWD by end)   Nustep L5 X 3 min     Knee/Hip Exercises: Standing   Other Standing Knee Exercises reviewed standing step calf stretch   Other Standing Knee Exercises manual dorsiflexion stretch into knee extension     Knee/Hip Exercises:  Seated   Long Arc [employer]ad Strengthening;Right;2 sets;10 reps   Heel Slides AAROM;Right   Heel Slides Limitations with skate, endrange hold    Hamstring Curl Strengthening;Right;2 sets;10 reps   Hamstring Limitations manual resistance     Knee/Hip Exercises: Prone   Prone Knee Hang --  reviewed technique for HEP     Cryotherapy   Type of Cryotherapy --  declined     Manual Therapy   Manual Therapy Soft tissue mobilization   Manual therapy comments patellar mobs sup/inf   Soft tissue mobilization STM to hamstrings/proximal gastroc during extension hang                PT Education - 07/24/16 1700    Education provided Yes   Education Details HEP- focus on knee extension. Reviewed gastroc stretch and prone knee extension   Methods Explanation;Demonstration   Comprehension Verbalized understanding             PT Long Term Goals - 07/24/16 1704       PT LONG TERM GOAL #1   Title Independent with advanced HEP ( 08/13/16)   Period Weeks   Status On-going     PT LONG TERM GOAL #2   Title improve Rt Knee ROM -4 extension to 110 flexion ( 12/19/170    Period Weeks   Status On-going     PT LONG TERM GOAL #3   Title demo Rt LE strength =/> 5-/5 to allow alternating gait on stairs ( 08/13/16)    Time 6   Period Weeks   Status On-going     PT LONG TERM GOAL #4   Title report pain no greater than 2/10 with walking ( 08/13/16)    Period Weeks   Status On-going     PT LONG TERM GOAL #5   Title improve FOTO =/< 49% limited ( 08/13/16)    Time 6   Period Weeks   Status On-going     PT LONG TERM GOAL #6   Title ambulate without an assistive device an no gait devations on level surfaces. ( 08/13/16)    Time 6   Period Weeks   Status On-going               Plan - 07/24/16 1701    Clinical Impression Statement Pt making gradual progress with Rt knee strengthening and ROM. Current Rt knee ROM 10-111 degrees. Clinic and home program stressing knee extension and consistency at home. To continue with OPPT services as tolerated with advancement of ROM and strengthening.    Rehab Potential Excellent   PT Frequency 2x / week   PT Duration 6 weeks   PT Treatment/Interventions Moist Heat;Ultrasound;Therapeutic exercise;Dry needling;Taping;Vasopneumatic Device;Manual techniques;Neuromuscular re-education;Cryotherapy;Electrical Stimulation;Gait training;Stair training;Patient/family education;Scar mobilization   PT Next Visit Plan Stress knee extension both in clinic and with HEP.    Consulted and Agree with Plan of Care Patient      Patient will benefit from skilled therapeutic intervention in order to improve the following deficits and impairments:  Increased edema, Decreased strength, Pain, Decreased range of motion, Difficulty walking, Decreased endurance  Visit Diagnosis: Muscle weakness (generalized)  Stiffness of right  knee, not elsewhere classified  Swelling of joint, knee, right  Other abnormalities of gait and mobility     Problem List Patient Active Problem List   Diagnosis Date Noted  . Right knee DJD 06/13/2016  . Complete rotator cuff tear 12/01/2015  . Wheezing 04/07/2014  . Impaired fasting glucose 07/26/2013  .  Essential hypertension, benign 07/26/2013  . Back pain 07/26/2013  . Knee pain, bilateral 01/10/2013  . Morbid obesity (HCC) 01/10/2013  . Headache(784.0) 12/06/2012    Delton See, PT, CSCS 07/24/2016, 5:06 PM  [facility] [address] [place] [address] [place], [place], [address] Phone: [phone]   Fax:  [phone]  Name: Megan Petersen MRN: [id number] Date of Birth: [birth year]

## 2016-07-26 ENCOUNTER — Encounter: Payer: Self-pay | Admitting: Rehabilitative and Restorative Service Providers"

## 2016-07-26 ENCOUNTER — Ambulatory Visit (INDEPENDENT_AMBULATORY_CARE_PROVIDER_SITE_OTHER): Payer: BLUE CROSS/BLUE SHIELD | Admitting: Rehabilitative and Restorative Service Providers"

## 2016-07-26 DIAGNOSIS — M25461 Effusion, right knee: Secondary | ICD-10-CM | POA: Diagnosis not present

## 2016-07-26 DIAGNOSIS — R2689 Other abnormalities of gait and mobility: Secondary | ICD-10-CM

## 2016-07-26 DIAGNOSIS — M25661 Stiffness of right knee, not elsewhere classified: Secondary | ICD-10-CM

## 2016-07-26 DIAGNOSIS — M6281 Muscle weakness (generalized): Secondary | ICD-10-CM | POA: Diagnosis not present

## 2016-07-26 NOTE — Therapy (Signed)
[facility] [address] [place], [place], [address] Phone: [phone]   Fax:  [phone]  Physical Therapy Treatment  Patient Details  Name: Megan Petersen MRN: [id number] Date of Birth: 09/22/1960 Referring Provider: Dr Jene Every  Encounter Date: 07/26/2016      PT End of Session - 07/26/16 1547    Visit Number 9   Number of Visits 12   Date for PT Re-Evaluation 08/13/16   PT Start Time 1531   PT Stop Time 1621   PT Time Calculation (min) 50 min   Activity Tolerance Patient tolerated treatment well      Past Medical History:  Diagnosis Date  . Allergy   . Asthma   . Atypical glandular cells on Pap smear   . Esophageal reflux   . Family history of adverse reaction to anesthesia    brother slow to awaken  . GERD (gastroesophageal reflux disease)    Nexium controls  . Headache   . Hemorrhoids    history of- not a problem at present.  . Hyperlipidemia   . Hypertension   . Impaired fasting glucose    Dr. Jenny Reichmann -told pt Hgb A1C level was good.  Marland Kitchen LSIL (low grade squamous intraepithelial lesion) on Pap smear   . Morbid obesity (HCC)   . Osteoarthritis    Bilateral knees  . Osteoarthritis   . Pneumonia 2016  . Primary cough headache   . Rosacea   . Swelling of limb   . Transfusion history    '88- s/p childbirth  . Trigeminal neuralgia   . URI (upper respiratory infection)    cough with green sputum since 11-26-15  . Vertigo    10 yrs ago- only-    Past Surgical History:  Procedure Laterality Date  . CHOLECYSTECTOMY     history of gallstones  . LEEP    . right knee meniscus repair  2014  . SHOULDER OPEN ROTATOR CUFF REPAIR Right 12/01/2015   Procedure: RIGHT SHOULDER MINI OPEN ROTATOR CUFF REPAIR WITH RESECTON OF DISTAL CLAVICLE AND SUBACROMIAL DECOMPRESSION;  Surgeon: Jene Every, MD;  Location: [facility];  Service: Orthopedics;  Laterality: Right;  . TOTAL KNEE ARTHROPLASTY Right  06/13/2016   Procedure: RIGHT TOTAL KNEE ARTHROPLASTY;  Surgeon: Jene Every, MD;  Location: [facility];  Service: Orthopedics;  Laterality: Right;    There were no vitals filed for this visit.      Subjective Assessment - 07/26/16 1548    Subjective Received injectioin in the Lt knee yesterday - which continues to be very sore and painful due to inflammation. Rt knee does not hurt as much as the Lt. PA at MD office told Megan Petersen she needs to have full extension when she returns in 6 weeks or they will put her in a Jaz splint.    Currently in Pain? Yes   Pain Score 2    Pain Location Knee   Pain Orientation Right   Pain Descriptors / Indicators Aching   Pain Type Surgical pain   Pain Onset More than a month ago   Pain Frequency Intermittent   Aggravating Factors  bending and straightening to end range; finding a position to sleep in that's comfortable    Pain Relieving Factors ice; pain patch   Multiple Pain Sites Yes   Pain Score 5   Pain Location Knee   Pain Orientation Right   Pain Descriptors / Indicators Aching;Sharp;Sore   Pain Type Chronic pain   Pain  Onset More than a month ago   Pain Frequency Constant            [facility] PT Assessment - 07/26/16 0001      Assessment   Medical Diagnosis Rt TKR   Referring Provider Dr Jene EveryJeffrey Beane   Onset Date/Surgical Date 06/13/16   Hand Dominance Right   Next MD Visit 1/18   Prior Therapy not for knee     AROM   Right Knee Extension -8     PROM   Right Knee Extension -6                     [facility] Adult PT Treatment/Exercise - 07/26/16 0001      Ambulation/Gait   Gait Comments ambulating short distances without assistive device ~ 20 feet x 4      Knee/Hip Exercises: Stretches   Passive Hamstring Stretch Right;2 reps;30 seconds   Gastroc Stretch Right;Left;2 reps;60 seconds     Knee/Hip Exercises: Aerobic   Recumbent Bike L1 x 5-7 min full revolution      Knee/Hip Exercises: Standing   Terminal Knee  Extension Limitations TKE against green TB 5 sec hold x 10    Lateral Step Up Right;2 sets;10 reps;Hand Hold: 2;Step Height: 6"   Forward Step Up Right;2 sets;10 reps;Hand Hold: 2;Step Height: 6"     Knee/Hip Exercises: Seated   Long Arc Quad Strengthening;Right;2 sets;10 reps   Other Seated Knee/Hip Exercises knee extension heel on floor 10 sec x 10      Knee/Hip Exercises: Supine   Quad Sets Strengthening;Right;1 set;10 reps  10 sec    Quad Sets Limitations heel rested on foam roll   Straight Leg Raises Strengthening;Right;10 reps  3 sec hold      Manual Therapy   Manual Therapy Soft tissue mobilization   Manual therapy comments patellar mobs sup/inf   Soft tissue mobilization incision/quads    Kinesiotex Edema                     PT Long Term Goals - 07/24/16 1704      PT LONG TERM GOAL #1   Title Independent with advanced HEP ( 08/13/16)   Period Weeks   Status On-going     PT LONG TERM GOAL #2   Title improve Rt Knee ROM -4 extension to 110 flexion ( 12/19/170    Period Weeks   Status On-going     PT LONG TERM GOAL #3   Title demo Rt LE strength =/> 5-/5 to allow alternating gait on stairs ( 08/13/16)    Time 6   Period Weeks   Status On-going     PT LONG TERM GOAL #4   Title report pain no greater than 2/10 with walking ( 08/13/16)    Period Weeks   Status On-going     PT LONG TERM GOAL #5   Title improve FOTO =/< 49% limited ( 08/13/16)    Time 6   Period Weeks   Status On-going     PT LONG TERM GOAL #6   Title ambulate without an assistive device an no gait devations on level surfaces. ( 08/13/16)    Time 6   Period Weeks   Status On-going               Plan - 07/26/16 1559    Clinical Impression Statement Saw MD yesterday. She is no longer taking oxycotin - taking tramadol and robaxin. She is  having less pain in the Rt knee. Has had increased pain in the Lt knee. Received an injection in the Lt knee yesterday.    PT Frequency  2x / week   PT Duration 6 weeks   PT Treatment/Interventions Moist Heat;Ultrasound;Therapeutic exercise;Dry needling;Taping;Vasopneumatic Device;Manual techniques;Neuromuscular re-education;Cryotherapy;Electrical Stimulation;Gait training;Stair training;Patient/family education;Scar mobilization   PT Next Visit Plan Stress knee extension both in clinic and with HEP.    Consulted and Agree with Plan of Care Patient      Patient will benefit from skilled therapeutic intervention in order to improve the following deficits and impairments:  Increased edema, Decreased strength, Pain, Decreased range of motion, Difficulty walking, Decreased endurance  Visit Diagnosis: Muscle weakness (generalized)  Stiffness of right knee, not elsewhere classified  Swelling of joint, knee, right  Other abnormalities of gait and mobility     Problem List Patient Active Problem List   Diagnosis Date Noted  . Right knee DJD 06/13/2016  . Complete rotator cuff tear 12/01/2015  . Wheezing 04/07/2014  . Impaired fasting glucose 07/26/2013  . Essential hypertension, benign 07/26/2013  . Back pain 07/26/2013  . Knee pain, bilateral 01/10/2013  . Morbid obesity (HCC) 01/10/2013  . Headache(784.0) 12/06/2012    Morris Longenecker Rober MinionP Lache Dagher PT, MPH  07/26/2016, 4:34 PM  [facility] [address] [place], [place], [address] Phone: [phone]   Fax:  [phone]  Name: [name] MRN: [id number] Date of Birth: 1960-09-17

## 2016-07-31 ENCOUNTER — Encounter: Payer: Self-pay | Admitting: Rehabilitative and Restorative Service Providers"

## 2016-07-31 ENCOUNTER — Ambulatory Visit (INDEPENDENT_AMBULATORY_CARE_PROVIDER_SITE_OTHER): Payer: BLUE CROSS/BLUE SHIELD | Admitting: Rehabilitative and Restorative Service Providers"

## 2016-07-31 DIAGNOSIS — M25461 Effusion, right knee: Secondary | ICD-10-CM | POA: Diagnosis not present

## 2016-07-31 DIAGNOSIS — M25661 Stiffness of right knee, not elsewhere classified: Secondary | ICD-10-CM | POA: Diagnosis not present

## 2016-07-31 DIAGNOSIS — R2689 Other abnormalities of gait and mobility: Secondary | ICD-10-CM | POA: Diagnosis not present

## 2016-07-31 DIAGNOSIS — M6281 Muscle weakness (generalized): Secondary | ICD-10-CM | POA: Diagnosis not present

## 2016-07-31 NOTE — Therapy (Signed)
[facility] [address] [place], [place], [address] Phone: [phone]   Fax:  [phone]  Physical Therapy Treatment  Patient Details  Name: Megan Petersen MRN: [id number] Date of Birth: June 28, 1961 Referring Provider: Dr Jene Everyjeffrey Beane  Encounter Date: 07/31/2016      PT End of Session - 07/31/16 0951    Visit Number 10   Number of Visits 12   Date for PT Re-Evaluation 08/13/16   PT Start Time 0933   PT Stop Time 1029   PT Time Calculation (min) 56 min   Activity Tolerance Patient tolerated treatment well      Past Medical History:  Diagnosis Date  . Allergy   . Asthma   . Atypical glandular cells on Pap smear   . Esophageal reflux   . Family history of adverse reaction to anesthesia    brother slow to awaken  . GERD (gastroesophageal reflux disease)    Nexium controls  . Headache   . Hemorrhoids    history of- not a problem at present.  . Hyperlipidemia   . Hypertension   . Impaired fasting glucose    Dr. Jenny ReichmannZanard,PCP -told pt Hgb A1C level was good.  Marland Kitchen. LSIL (low grade squamous intraepithelial lesion) on Pap smear   . Morbid obesity (HCC)   . Osteoarthritis    Bilateral knees  . Osteoarthritis   . Pneumonia 2016  . Primary cough headache   . Rosacea   . Swelling of limb   . Transfusion history    '88- s/p childbirth  . Trigeminal neuralgia   . URI (upper respiratory infection)    cough with green sputum since 11-26-15  . Vertigo    10 yrs ago- only-    Past Surgical History:  Procedure Laterality Date  . CHOLECYSTECTOMY     history of gallstones  . LEEP    . right knee meniscus repair  2014  . SHOULDER OPEN ROTATOR CUFF REPAIR Right 12/01/2015   Procedure: RIGHT SHOULDER MINI OPEN ROTATOR CUFF REPAIR WITH RESECTON OF DISTAL CLAVICLE AND SUBACROMIAL DECOMPRESSION;  Surgeon: Jene EveryJeffrey Beane, MD;  Location: [facility];  Service: Orthopedics;  Laterality: Right;  . TOTAL KNEE ARTHROPLASTY Right  06/13/2016   Procedure: RIGHT TOTAL KNEE ARTHROPLASTY;  Surgeon: Jene EveryJeffrey Beane, MD;  Location: [facility];  Service: Orthopedics;  Laterality: Right;    There were no vitals filed for this visit.      Subjective Assessment - 07/31/16 0951    Subjective Some LBP on the Rt side after last Pt treatment. Not sure what irritated the back. Tape really worked well on the tightness at proximal incision and helped with the swelling. Overall continues to improve. Less pain and improved movement;    Currently in Pain? Yes   Pain Score 2    Pain Location Knee   Pain Orientation Right   Pain Descriptors / Indicators Aching            [facility] - 07/31/16 0001      Assessment   Medical Diagnosis Rt TKR   Referring Provider Dr Jene Everyjeffrey Beane   Onset Date/Surgical Date 06/13/16   Hand Dominance Right   Next MD Visit 1/18     AROM   Right Knee Extension -7   Right Knee Flexion 110  supine     PROM   Right Knee Extension -6  [facility] Adult PT Treatment/Exercise - 07/31/16 0001      Ambulation/Gait   Gait Comments ambulating short distances without assistive device ~ 20 feet x 4      Knee/Hip Exercises: Stretches   Passive Hamstring Stretch Right;2 reps;30 seconds   Gastroc Stretch Right;Left;2 reps;60 seconds     Knee/Hip Exercises: Aerobic   Recumbent Bike L1 x 6 min full revolution      Knee/Hip Exercises: Standing   Terminal Knee Extension Limitations TKE 10 sec hold x 10 back at wall   Lateral Step Up Right;2 sets;10 reps;Hand Hold: 2;Step Height: 6"   Forward Step Up Right;2 sets;10 reps;Hand Hold: 2;Step Height: 6"   SLS blue pad 30 sec x 3 +1 HH for balance   Other Standing Knee Exercises gait without assistive device ~ 20 ft x 4; backward walking/narrow based (attempt at heel to toe)/side steps toward each side ~ 20 feet x 2 each      Knee/Hip Exercises: Seated   Long Arc Quad Strengthening;Right;2 sets;10 reps   Heel Slides  AAROM;Right   Other Seated Knee/Hip Exercises knee extension heel on floor 10 sec x 10      Knee/Hip Exercises: Supine   Terminal Knee Extension Right;5 reps  10 sec hold with overpressure by PT     Cryotherapy   Number Minutes Cryotherapy 12 Minutes   Cryotherapy Location Knee  Rt   Type of Cryotherapy Ice pack     Manual Therapy   Manual Therapy Soft tissue mobilization   Manual therapy comments patellar mobs sup/inf   Soft tissue mobilization incision/quads    Kinesiotex Edema     Kinesiotix   Edema --  one lift strip at proximal incision; basket weave for edema                     PT Long Term Goals - 07/31/16 0955      PT LONG TERM GOAL #1   Title Independent with advanced HEP ( 08/13/16)   Period Weeks   Status On-going     PT LONG TERM GOAL #2   Title improve Rt Knee ROM -4 extension to 110 flexion ( 12/19/170    Time 6   Period Weeks   Status On-going     PT LONG TERM GOAL #3   Title demo Rt LE strength =/> 5-/5 to allow alternating gait on stairs ( 08/13/16)    Time 6   Period Weeks   Status On-going     PT LONG TERM GOAL #4   Title report pain no greater than 2/10 with walking ( 08/13/16)    Time 6   Period Weeks   Status On-going     PT LONG TERM GOAL #5   Title improve FOTO =/< 49% limited ( 08/13/16)    Time 6   Period Weeks   Status On-going     PT LONG TERM GOAL #6   Title ambulate without an assistive device an no gait devations on level surfaces. ( 08/13/16)    Time 6   Period Weeks   Status On-going               Plan - 07/31/16 1153    Clinical Impression Statement Continued gradual progress with knee rehab - overall less pain and improved function. Encouraged pt to use ice for knee daily(noted cont increased temp Rt knee to touch). Gradually progressing toward stated goals of therapy.    Rehab Potential Excellent  PT Frequency 2x / week   PT Duration 6 weeks   PT Treatment/Interventions Moist  Heat;Ultrasound;Therapeutic exercise;Dry needling;Taping;Vasopneumatic Device;Manual techniques;Neuromuscular re-education;Cryotherapy;Electrical Stimulation;Gait training;Stair training;Patient/family education;Scar mobilization   PT Next Visit Plan Stress knee extension both in clinic and with HEP.    Consulted and Agree with Plan of Care Patient      Patient will benefit from skilled therapeutic intervention in order to improve the following deficits and impairments:  Increased edema, Decreased strength, Pain, Decreased range of motion, Difficulty walking, Decreased endurance  Visit Diagnosis: Muscle weakness (generalized)  Stiffness of right knee, not elsewhere classified  Swelling of joint, knee, right  Other abnormalities of gait and mobility     Problem List Patient Active Problem List   Diagnosis Date Noted  . Right knee DJD 06/13/2016  . Complete rotator cuff tear 12/01/2015  . Wheezing 04/07/2014  . Impaired fasting glucose 07/26/2013  . Essential hypertension, benign 07/26/2013  . Back pain 07/26/2013  . Knee pain, bilateral 01/10/2013  . Morbid obesity (HCC) 01/10/2013  . Headache(784.0) 12/06/2012    Celyn Rober Minion PT, MPH  07/31/2016, 11:58 AM  [facility] [address] [place], [place], [address] Phone: [phone]   Fax:  [phone]  Name: Megan Petersen MRN: [id number] Date of Birth: 10-Mar-1961

## 2016-08-02 ENCOUNTER — Ambulatory Visit (INDEPENDENT_AMBULATORY_CARE_PROVIDER_SITE_OTHER): Payer: BLUE CROSS/BLUE SHIELD | Admitting: Physical Therapy

## 2016-08-02 DIAGNOSIS — M6281 Muscle weakness (generalized): Secondary | ICD-10-CM | POA: Diagnosis not present

## 2016-08-02 DIAGNOSIS — R2689 Other abnormalities of gait and mobility: Secondary | ICD-10-CM | POA: Diagnosis not present

## 2016-08-02 DIAGNOSIS — M25661 Stiffness of right knee, not elsewhere classified: Secondary | ICD-10-CM

## 2016-08-02 DIAGNOSIS — M25461 Effusion, right knee: Secondary | ICD-10-CM

## 2016-08-02 NOTE — Therapy (Signed)
[facility] [address] [place], [place], [address] Phone: [phone]   Fax:  [phone]  Physical Therapy Treatment  Patient Details  Name: Megan Petersen MRN: [id number] Date of Birth: July 22, 1961 Referring Provider: Dr. Jene EveryJeffrey Beane   Encounter Date: 08/02/2016      PT End of Session - 08/02/16 0901    Visit Number 11   Number of Visits 12   Date for PT Re-Evaluation 08/13/16   PT Start Time 0851   PT Stop Time 0947   PT Time Calculation (min) 56 min   Activity Tolerance Patient tolerated treatment well      Past Medical History:  Diagnosis Date  . Allergy   . Asthma   . Atypical glandular cells on Pap smear   . Esophageal reflux   . Family history of adverse reaction to anesthesia    brother slow to awaken  . GERD (gastroesophageal reflux disease)    Nexium controls  . Headache   . Hemorrhoids    history of- not a problem at present.  . Hyperlipidemia   . Hypertension   . Impaired fasting glucose    Dr. Jenny ReichmannZanard,PCP -told pt Hgb A1C level was good.  Marland Kitchen. LSIL (low grade squamous intraepithelial lesion) on Pap smear   . Morbid obesity (HCC)   . Osteoarthritis    Bilateral knees  . Osteoarthritis   . Pneumonia 2016  . Primary cough headache   . Rosacea   . Swelling of limb   . Transfusion history    '88- s/p childbirth  . Trigeminal neuralgia   . URI (upper respiratory infection)    cough with green sputum since 11-26-15  . Vertigo    10 yrs ago- only-    Past Surgical History:  Procedure Laterality Date  . CHOLECYSTECTOMY     history of gallstones  . LEEP    . right knee meniscus repair  2014  . SHOULDER OPEN ROTATOR CUFF REPAIR Right 12/01/2015   Procedure: RIGHT SHOULDER MINI OPEN ROTATOR CUFF REPAIR WITH RESECTON OF DISTAL CLAVICLE AND SUBACROMIAL DECOMPRESSION;  Surgeon: Jene EveryJeffrey Beane, MD;  Location: [facility];  Service: Orthopedics;  Laterality: Right;  . TOTAL KNEE ARTHROPLASTY Right  06/13/2016   Procedure: RIGHT TOTAL KNEE ARTHROPLASTY;  Surgeon: Jene EveryJeffrey Beane, MD;  Location: [facility];  Service: Orthopedics;  Laterality: Right;    There were no vitals filed for this visit.      Subjective Assessment - 08/02/16 0857    Subjective "I haven't been able to sleep".  She is only able to sleep 2 hrs at a time, 5-6 hours total.    Patient Stated Goals sleep better, get rid of pain, walk normal.    Currently in Pain? Yes   Pain Score 3    Pain Location Knee   Pain Orientation Right   Pain Descriptors / Indicators Aching   Aggravating Factors  bending and straightening at end range.    Pain Relieving Factors ice, pain patch             [facility] PT Assessment - 08/02/16 0001      Assessment   Medical Diagnosis Rt TKR   Referring Provider Dr. Jene EveryJeffrey Beane    Onset Date/Surgical Date 06/13/16   Hand Dominance Right   Next MD Visit 1/18     Strength   Right/Left Hip Right   Right Hip Flexion 5/5   Right Hip Extension 4+/5  Lt 3/5   Right Hip ABduction  3-/5   Right Knee Flexion --  5-/5   Right Knee Extension --  5-/5           [facility] Adult PT Treatment/Exercise - 08/02/16 0001      Knee/Hip Exercises: Stretches   Passive Hamstring Stretch Right;30 seconds;Left;2 reps   LobbyistQuad Stretch Right;3 reps;30 seconds   Gastroc Stretch Right;Left;3 reps;30 seconds     Knee/Hip Exercises: Aerobic   Recumbent Bike 1/2 revolutions to full revolutions x 8 min total      Knee/Hip Exercises: Sidelying   Hip ABduction Right;1 set;15 reps  (challenging)     Knee/Hip Exercises: Prone   Hamstring Curl 3 sets;5 reps   Prone Knee Hang 1 minute  3 reps    Straight Leg Raises Strengthening;Left;1 set;10 reps     Vasopneumatic   Number Minutes Vasopneumatic  15 minutes   Vasopnuematic Location  Knee   Vasopneumatic Pressure Low   Vasopneumatic Temperature  3*     Manual Therapy   Manual therapy comments patellar mobs sup/inf   Soft tissue mobilization incision/Rt  distal quad;   Rt distal hamstring    Kinesiotex Edema     Kinesiotix   Edema --  one lift strip at proximal incision; basket weave for edema                     PT Long Term Goals - 07/31/16 0955      PT LONG TERM GOAL #1   Title Independent with advanced HEP ( 08/13/16)   Period Weeks   Status On-going     PT LONG TERM GOAL #2   Title improve Rt Knee ROM -4 extension to 110 flexion ( 12/19/170    Time 6   Period Weeks   Status On-going     PT LONG TERM GOAL #3   Title demo Rt LE strength =/> 5-/5 to allow alternating gait on stairs ( 08/13/16)    Time 6   Period Weeks   Status On-going     PT LONG TERM GOAL #4   Title report pain no greater than 2/10 with walking ( 08/13/16)    Time 6   Period Weeks   Status On-going     PT LONG TERM GOAL #5   Title improve FOTO =/< 49% limited ( 08/13/16)    Time 6   Period Weeks   Status On-going     PT LONG TERM GOAL #6   Title ambulate without an assistive device an no gait devations on level surfaces. ( 08/13/16)    Time 6   Period Weeks   Status On-going               Plan - 08/02/16 0929    Clinical Impression Statement Pt demonstrated improved RLE strength; continues with weakenss in Rt hip abductors.   She felt a little more stiff with exercise today than previous session; attributes it to the cold weather.  She is making progress towards established goals.    Rehab Potential Excellent   PT Frequency 2x / week   PT Duration 6 weeks   PT Treatment/Interventions Moist Heat;Ultrasound;Therapeutic exercise;Dry needling;Taping;Vasopneumatic Device;Manual techniques;Neuromuscular re-education;Cryotherapy;Electrical Stimulation;Gait training;Stair training;Patient/family education;Scar mobilization   PT Next Visit Plan Continue progressive ROM/ strengthening for RLE; focus on ext for home program.     Consulted and Agree with Plan of Care Patient      Patient will benefit from skilled therapeutic  intervention in order to improve the following  deficits and impairments:  Increased edema, Decreased strength, Pain, Decreased range of motion, Difficulty walking, Decreased endurance  Visit Diagnosis: No diagnosis found.     Problem List Patient Active Problem List   Diagnosis Date Noted  . Right knee DJD 06/13/2016  . Complete rotator cuff tear 12/01/2015  . Wheezing 04/07/2014  . Impaired fasting glucose 07/26/2013  . Essential hypertension, benign 07/26/2013  . Back pain 07/26/2013  . Knee pain, bilateral 01/10/2013  . Morbid obesity (HCC) 01/10/2013  . Headache(784.0) 12/06/2012   Mayer Camel, PTA 08/02/16 9:37 AM  [facility] [address] [place], [place], [address] Phone: [phone]   Fax:  [phone]  Name: Megan Petersen MRN: [id number] Date of Birth: [birth year]

## 2016-08-05 ENCOUNTER — Ambulatory Visit (INDEPENDENT_AMBULATORY_CARE_PROVIDER_SITE_OTHER): Payer: BLUE CROSS/BLUE SHIELD | Admitting: Physical Therapy

## 2016-08-05 DIAGNOSIS — M25461 Effusion, right knee: Secondary | ICD-10-CM | POA: Diagnosis not present

## 2016-08-05 DIAGNOSIS — R2689 Other abnormalities of gait and mobility: Secondary | ICD-10-CM

## 2016-08-05 DIAGNOSIS — M6281 Muscle weakness (generalized): Secondary | ICD-10-CM

## 2016-08-05 DIAGNOSIS — M25661 Stiffness of right knee, not elsewhere classified: Secondary | ICD-10-CM | POA: Diagnosis not present

## 2016-08-05 NOTE — Therapy (Signed)
[place], [place], [address] Phone: [phone]   Fax:  [phone]  Physical Therapy Treatment  Patient Details  Name: Megan Petersen MRN: [id number] Date of Birth: 09/06/1960 Referring Provider: Dr. Susa Day  Encounter Date: 08/05/2016      PT End of Session - 08/05/16 0858    Visit Number 12   Date for PT Re-Evaluation --   PT Start Time 0857  pt arrived late   PT Stop Time 0946   PT Time Calculation (min) 49 min   Activity Tolerance Patient tolerated treatment well   Behavior During Therapy [facility] for tasks assessed/performed      Past Medical History:  Diagnosis Date  . Allergy   . Asthma   . Atypical glandular cells on Pap smear   . Esophageal reflux   . Family history of adverse reaction to anesthesia    brother slow to awaken  . GERD (gastroesophageal reflux disease)    Nexium controls  . Headache   . Hemorrhoids    history of- not a problem at present.  . Hyperlipidemia   . Hypertension   . Impaired fasting glucose    Dr. Darcella Cheshire -told pt Hgb A1C level was good.  Marland Kitchen LSIL (low grade squamous intraepithelial lesion) on Pap smear   . Morbid obesity ([place])   . Osteoarthritis    Bilateral knees  . Osteoarthritis   . Pneumonia 2016  . Primary cough headache   . Rosacea   . Swelling of limb   . Transfusion history    '88- s/p childbirth  . Trigeminal neuralgia   . URI (upper respiratory infection)    cough with green sputum since 11-26-15  . Vertigo    10 yrs ago- only-    Past Surgical History:  Procedure Laterality Date  . CHOLECYSTECTOMY     history of gallstones  . LEEP    . right knee meniscus repair  2014  . SHOULDER OPEN ROTATOR CUFF REPAIR Right 12/01/2015   Procedure: RIGHT SHOULDER MINI OPEN ROTATOR CUFF REPAIR WITH RESECTON OF DISTAL CLAVICLE AND SUBACROMIAL DECOMPRESSION;  Surgeon: Susa Day, MD;  Location: [facility];  Service: Orthopedics;   Laterality: Right;  . TOTAL KNEE ARTHROPLASTY Right 06/13/2016   Procedure: RIGHT TOTAL KNEE ARTHROPLASTY;  Surgeon: Susa Day, MD;  Location: [facility];  Service: Orthopedics;  Laterality: Right;    There were no vitals filed for this visit.      Subjective Assessment - 08/05/16 0858    Subjective Pt fell this weekend, getting tripped up by great nephew.   Her Rt low back is painful today.  she is wearing a pain patch for pain.  Otherwise no new changes with knee.   She continues to have difficulty sleeping.    Patient Stated Goals sleep better, get rid of pain, walk normal.    Currently in Pain? Yes   Pain Score 3    Pain Location Back   Pain Orientation Right   Pain Descriptors / Indicators Sore   Aggravating Factors  bending    Pain Relieving Factors pain patch            [facility] PT Assessment - 08/05/16 0001      Assessment   Medical Diagnosis Rt TKR   Referring Provider Dr. Susa Day   Onset Date/Surgical Date 06/13/16   Hand Dominance Right   Next MD Visit 1/18     AROM   Right/Left Knee Right  Right Knee Extension -11   Right Knee Flexion 112     Strength   Right/Left Hip Right   Right Hip Flexion 5/5   Right Hip Extension 4+/5  Lt 3/5   Right Hip ABduction 3/5   Right Knee Flexion --  5-/5   Right Knee Extension --  5-/5           [facility] Adult PT Treatment/Exercise - 08/05/16 0001      Knee/Hip Exercises: Stretches   Passive Hamstring Stretch Right;30 seconds;Left;2 reps   Gastroc Stretch Right;Left;3 reps;30 seconds     Knee/Hip Exercises: Aerobic   Recumbent Bike 1/2 revolutions to full revolutions @ L1 x 8 min total      Knee/Hip Exercises: Seated   Other Seated Knee/Hip Exercises Seated scoots x 10 sec hold x  5 reps      Knee/Hip Exercises: Sidelying   Hip ABduction Right;15 reps;2 sets  (challenging)     Knee/Hip Exercises: Prone   Hamstring Curl 2 sets;10 reps   Hamstring Curl Limitations 2.5# first set, 4# second set     Prone Knee Hang 1 minute  3 reps    Prone Knee Hang Weights (lbs) --  2.5#     Vasopneumatic   Number Minutes Vasopneumatic  15 minutes   Vasopnuematic Location  Knee   Vasopneumatic Pressure Low   Vasopneumatic Temperature  3*     Manual Therapy   Manual therapy comments patellar mobs sup/inf   Passive ROM Rt knee overpressure into ext with ankle on bolster.    Kinesiotex Edema     Kinesiotix   Edema --  one lift strip at proximal incision; basket weave for edema                     PT Long Term Goals - 08/05/16 0903      PT LONG TERM GOAL #1   Title Independent with advanced HEP ( 08/13/16)   Time 6   Period Weeks   Status On-going     PT LONG TERM GOAL #2   Title improve Rt Knee ROM -4 extension to 110 flexion ( 12/19/170    Time 6   Period Weeks   Status Partially Met     PT LONG TERM GOAL #3   Title demo Rt LE strength =/> 5-/5 to allow alternating gait on stairs ( 08/13/16)    Time 6   Period Weeks   Status On-going     PT LONG TERM GOAL #4   Title report pain no greater than 2/10 with walking ( 08/13/16)    Time 6   Period Weeks   Status Achieved  1-2/10 with walking     PT LONG TERM GOAL #5   Title improve FOTO =/< 49% limited ( 08/13/16)    Time 6   Period Weeks   Status Achieved     PT LONG TERM GOAL #6   Title ambulate without an assistive device an no gait devations on level surfaces. ( 08/13/16)    Time 6   Period Weeks   Status On-going               Plan - 08/05/16 0932    Clinical Impression Statement Pt demonstrated improved Rt knee flexion ROM, decreased extension ROM.  Continued weakness in Rt hip abductors.  She scored 49% limited on FOTO survey; has met LTG #5.  She has partially met her goals and desires to continue therapy to  meet established goals.    Rehab Potential Excellent   PT Frequency 2x / week   PT Duration 6 weeks   PT Treatment/Interventions Moist Heat;Ultrasound;Therapeutic exercise;Dry  needling;Taping;Vasopneumatic Device;Manual techniques;Neuromuscular re-education;Cryotherapy;Electrical Stimulation;Gait training;Stair training;Patient/family education;Scar mobilization   PT Next Visit Plan Continue progressive ROM/ strengthening for RLE; focus on ext for home program.     Consulted and Agree with Plan of Care Patient      Patient will benefit from skilled therapeutic intervention in order to improve the following deficits and impairments:  Increased edema, Decreased strength, Pain, Decreased range of motion, Difficulty walking, Decreased endurance  Visit Diagnosis: Muscle weakness (generalized)  Stiffness of right knee, not elsewhere classified  Swelling of joint, knee, right  Other abnormalities of gait and mobility     Problem List Patient Active Problem List   Diagnosis Date Noted  . Right knee DJD 06/13/2016  . Complete rotator cuff tear 12/01/2015  . Wheezing 04/07/2014  . Impaired fasting glucose 07/26/2013  . Essential hypertension, benign 07/26/2013  . Back pain 07/26/2013  . Knee pain, bilateral 01/10/2013  . Morbid obesity ([place]) 01/10/2013  . Headache(784.0) 12/06/2012   Kerin Perna, PTA 08/05/16 1:14 PM  Celyn P. Helene Kelp PT, MPH 08/05/16 1:31 PM   [facility] [place], [place], [address] Phone: [phone]   Fax:  [phone]  Name: Megan Petersen MRN: [id number] Date of Birth: 1961/06/12

## 2016-08-07 ENCOUNTER — Ambulatory Visit (INDEPENDENT_AMBULATORY_CARE_PROVIDER_SITE_OTHER): Payer: BLUE CROSS/BLUE SHIELD | Admitting: Physical Therapy

## 2016-08-07 DIAGNOSIS — M25661 Stiffness of right knee, not elsewhere classified: Secondary | ICD-10-CM | POA: Diagnosis not present

## 2016-08-07 DIAGNOSIS — R2689 Other abnormalities of gait and mobility: Secondary | ICD-10-CM

## 2016-08-07 DIAGNOSIS — M6281 Muscle weakness (generalized): Secondary | ICD-10-CM | POA: Diagnosis not present

## 2016-08-07 DIAGNOSIS — M25461 Effusion, right knee: Secondary | ICD-10-CM

## 2016-08-07 NOTE — Therapy (Signed)
[facility] [place] [address] [place], [place], [address] Phone: [phone]   Fax:  [phone]  Physical Therapy Treatment  Patient Details  Name: Megan Petersen MRN: [id number] Date of Birth: Jul 23, 1961 Referring Provider: Dr. Jene EveryJeffrey Beane  Encounter Date: 08/07/2016      PT End of Session - 08/07/16 1027    Visit Number 13   Number of Visits 24   Date for PT Re-Evaluation 09/16/16   PT Start Time 1023  pt arrived late   PT Stop Time 1121   PT Time Calculation (min) 58 min      Past Medical History:  Diagnosis Date  . Allergy   . Asthma   . Atypical glandular cells on Pap smear   . Esophageal reflux   . Family history of adverse reaction to anesthesia    brother slow to awaken  . GERD (gastroesophageal reflux disease)    Nexium controls  . Headache   . Hemorrhoids    history of- not a problem at present.  . Hyperlipidemia   . Hypertension   . Impaired fasting glucose    Dr. Jenny ReichmannZanard,PCP -told pt Hgb A1C level was good.  Marland Kitchen. LSIL (low grade squamous intraepithelial lesion) on Pap smear   . Morbid obesity ([facility])   . Osteoarthritis    Bilateral knees  . Osteoarthritis   . Pneumonia 2016  . Primary cough headache   . Rosacea   . Swelling of limb   . Transfusion history    '88- s/p childbirth  . Trigeminal neuralgia   . URI (upper respiratory infection)    cough with green sputum since 11-26-15  . Vertigo    10 yrs ago- only-    Past Surgical History:  Procedure Laterality Date  . CHOLECYSTECTOMY     history of gallstones  . LEEP    . right knee meniscus repair  2014  . SHOULDER OPEN ROTATOR CUFF REPAIR Right 12/01/2015   Procedure: RIGHT SHOULDER MINI OPEN ROTATOR CUFF REPAIR WITH RESECTON OF DISTAL CLAVICLE AND SUBACROMIAL DECOMPRESSION;  Surgeon: Jene EveryJeffrey Beane, MD;  [facility];  Service: Orthopedics;  Laterality: Right;  . TOTAL KNEE ARTHROPLASTY Right 06/13/2016   Procedure: RIGHT TOTAL KNEE  ARTHROPLASTY;  Surgeon: Jene EveryJeffrey Beane, MD;  [facility];  Service: Orthopedics;  Laterality: Right;    There were no vitals filed for this visit.      Subjective Assessment - 08/07/16 1028    Subjective Pt reports no new changes since last visit.  She feels a little achier due to cold temp outside.    Currently in Pain? Yes   Pain Score 2    Pain Location Knee   Pain Orientation Right   Pain Descriptors / Indicators Aching   Aggravating Factors  moving knee past comfortable point   Pain Relieving Factors rest, sitting with knee bent.             [facility] - 08/07/16 0001      Assessment   Medical Diagnosis Rt TKR   Referring Provider Dr. Jene EveryJeffrey Beane   Onset Date/Surgical Date 06/13/16   Hand Dominance Right   Next MD Visit 1/18     AROM   Right/Left Knee Right   Right Knee Extension -8  after manual and stretches           [facility] Adult PT Treatment/Exercise - 08/07/16 0001      Knee/Hip Exercises: Stretches   Passive Hamstring Stretch Right;30  seconds;4 reps   Gastroc Stretch Right;Left;3 reps;30 seconds     Knee/Hip Exercises: Aerobic   Recumbent Bike 1/2 revolutions to full revolutions @ L1 x 8 min total      Knee/Hip Exercises: Standing   Lateral Step Up Right;1 set;15 reps;Hand Hold: 2;Step Height: 6"   Forward Step Up Right;1 set;15 reps;Hand Hold: 2;Step Height: 6"   SLS Rt SLS on blue pad x 15 sec x 4 reps   (challenging)     Knee/Hip Exercises: Prone   Hamstring Curl 1 set;10 reps   Prone Knee Hang 1 minute  3 reps      Modalities   Modalities Ultrasound;Vasopneumatic     Moist Heat Therapy   Number Minutes Moist Heat 15 Minutes   Moist Heat Location Knee  posterior     Cryotherapy   Number Minutes Cryotherapy 15 Minutes   Cryotherapy Location Knee  Rt   Type of Cryotherapy Ice pack     Ultrasound   Ultrasound Location Rt posterior knee   Ultrasound Parameters 100%, 8 min, 1.2 w/cm2    Ultrasound Goals Other  (Comment)  tightness.     Vasopneumatic   Number Minutes Vasopneumatic  --  equipment out of order   Vasopnuematic Location  --   Vasopneumatic Pressure --   Vasopneumatic Temperature  --     Manual Therapy   Soft tissue mobilization muscle stripping to Rt hamstring, pin and stretch with pt in prone.    Passive ROM Rt knee overpressure into ext with ankle on bolster.    Kinesiotex Edema     Kinesiotix   Edema --  basket weave over ant Rt knee for edema reduction                     PT Long Term Goals - 08/07/16 1159      PT LONG TERM GOAL #1   Title Independent with advanced HEP ( 09/16/16)   Time 12   Period Weeks   Status On-going     PT LONG TERM GOAL #2   Title improve Rt Knee ROM -4 extension to 118 flexion ( 09/16/16)    Time 12   Period Weeks   Status On-going     PT LONG TERM GOAL #3   Title demo Rt LE strength =/> 5-/5 to allow alternating gait on stairs ( 09/16/16)    Time 12   Period Weeks   Status On-going     PT LONG TERM GOAL #4   Title improve gait pattern with minimal limp with improved gait pattern with walking ( 09/16/16)    Time 12   Period Weeks   Status On-going     PT LONG TERM GOAL #5   Title Maintain FOTO =/< 49% limited ( 09/16/16)    Time 12   Period Weeks   Status Achieved     PT LONG TERM GOAL #6   Title ambulate without an assistive device an no gait devations on level surfaces and up and down 3-5 steps. ( 09/16/16)    Time 12   Period Weeks   Status On-going               Plan - 08/07/16 1203    Clinical Impression Statement Rt hamstrings remain tight, affecting pt's Rt knee extension ROM.  She tolerated all exercises well, challenged by SLS balance exercise the most. Pt making gradual gains towards unmet goals.    Rehab Potential Excellent  PT Frequency 2x / week   PT Duration 6 weeks   PT Treatment/Interventions Moist Heat;Ultrasound;Therapeutic exercise;Dry needling;Taping;Vasopneumatic Device;Manual  techniques;Neuromuscular re-education;Cryotherapy;Electrical Stimulation;Gait training;Stair training;Patient/family education;Scar mobilization   PT Next Visit Plan Continue progressive ROM/ strengthening for RLE; focus on ext for home program.     PT Home Exercise Plan pt to perform self massage to Rt hamstring and focus on extension.     Consulted and Agree with Plan of Care Patient      Patient will benefit from skilled therapeutic intervention in order to improve the following deficits and impairments:  Increased edema, Decreased strength, Pain, Decreased range of motion, Difficulty walking, Decreased endurance  Visit Diagnosis: Muscle weakness (generalized)  Stiffness of right knee, not elsewhere classified  Swelling of joint, knee, right  Other abnormalities of gait and mobility     Problem List Patient Active Problem List   Diagnosis Date Noted  . Right knee DJD 06/13/2016  . Complete rotator cuff tear 12/01/2015  . Wheezing 04/07/2014  . Impaired fasting glucose 07/26/2013  . Essential hypertension, benign 07/26/2013  . Back pain 07/26/2013  . Knee pain, bilateral 01/10/2013  . Morbid obesity (HCC) 01/10/2013  . Headache(784.0) 12/06/2012   Mayer Camel, PTA 08/07/16 12:11 PM  [facility] [place] [address] [place], [place], [address] Phone: [phone]   Fax:  [phone]  Name: Megan Petersen MRN: [id number] Date of Birth: 1961-02-14

## 2016-08-09 ENCOUNTER — Encounter: Payer: BLUE CROSS/BLUE SHIELD | Admitting: Physical Therapy

## 2016-08-12 ENCOUNTER — Ambulatory Visit (INDEPENDENT_AMBULATORY_CARE_PROVIDER_SITE_OTHER): Payer: BLUE CROSS/BLUE SHIELD | Admitting: Physical Therapy

## 2016-08-12 DIAGNOSIS — M6281 Muscle weakness (generalized): Secondary | ICD-10-CM

## 2016-08-12 DIAGNOSIS — M25661 Stiffness of right knee, not elsewhere classified: Secondary | ICD-10-CM

## 2016-08-12 DIAGNOSIS — M25461 Effusion, right knee: Secondary | ICD-10-CM | POA: Diagnosis not present

## 2016-08-12 NOTE — Therapy (Signed)
[place], [place], [address] Phone: [phone]   Fax:  [phone]  Physical Therapy Treatment  Patient Details  Name: Megan Petersen MRN: [id number] Date of Birth: 06-04-1961 Referring Provider: Dr. Ardyth Gal   Encounter Date: 08/12/2016      PT End of Session - 08/12/16 0855    Visit Number 14   Number of Visits 24   Date for PT Re-Evaluation 09/16/16   PT Start Time 0850   PT Stop Time 0951   PT Time Calculation (min) 61 min   Activity Tolerance Patient tolerated treatment well;No increased pain   Behavior During Therapy WFL for tasks assessed/performed      Past Medical History:  Diagnosis Date  . Allergy   . Asthma   . Atypical glandular cells on Pap smear   . Esophageal reflux   . Family history of adverse reaction to anesthesia    brother slow to awaken  . GERD (gastroesophageal reflux disease)    Nexium controls  . Headache   . Hemorrhoids    history of- not a problem at present.  . Hyperlipidemia   . Hypertension   . Impaired fasting glucose    Dr. Darcella Cheshire -told pt Hgb A1C level was good.  Marland Kitchen LSIL (low grade squamous intraepithelial lesion) on Pap smear   . Morbid obesity ([place])   . Osteoarthritis    Bilateral knees  . Osteoarthritis   . Pneumonia 2016  . Primary cough headache   . Rosacea   . Swelling of limb   . Transfusion history    '88- s/p childbirth  . Trigeminal neuralgia   . URI (upper respiratory infection)    cough with green sputum since 11-26-15  . Vertigo    10 yrs ago- only-    Past Surgical History:  Procedure Laterality Date  . CHOLECYSTECTOMY     history of gallstones  . LEEP    . right knee meniscus repair  2014  . SHOULDER OPEN ROTATOR CUFF REPAIR Right 12/01/2015   Procedure: RIGHT SHOULDER MINI OPEN ROTATOR CUFF REPAIR WITH RESECTON OF DISTAL CLAVICLE AND SUBACROMIAL DECOMPRESSION;  Surgeon: Susa Day, MD;  [facility];   Service: Orthopedics;  Laterality: Right;  . TOTAL KNEE ARTHROPLASTY Right 06/13/2016   Procedure: RIGHT TOTAL KNEE ARTHROPLASTY;  Surgeon: Susa Day, MD;  Location: [facility];  Service: Orthopedics;  Laterality: Right;    There were no vitals filed for this visit.      Subjective Assessment - 08/12/16 0855    Subjective Pt reports she has been working on Rt knee extension since last visit.  She has had her husband press her knee down.  She is trying to use it more.     Patient Stated Goals sleep better, get rid of pain, walk normal.    Currently in Pain? No/denies   Pain Score 0-No pain   Pain Location Knee   Pain Orientation Right            [facility] PT Assessment - 08/12/16 0001      Assessment   Medical Diagnosis Rt TKR   Referring Provider Dr. Ardyth Gal    Onset Date/Surgical Date 06/13/16   Hand Dominance Right   Next MD Visit 1/18     AROM   AROM Assessment Site Knee   Right/Left Knee Right   Right Knee Extension -7   Right Knee Flexion 115  with heel slide  [facility] Adult PT Treatment/Exercise - 08/12/16 0001      Knee/Hip Exercises: Stretches   Passive Hamstring Stretch Right;30 seconds;4 reps   Gastroc Stretch Right;Left;3 reps;30 seconds     Knee/Hip Exercises: Aerobic   Recumbent Bike L1: 5 min    Other Aerobic Laps around gym between exercises.      Knee/Hip Exercises: Standing   Heel Raises Both;1 set;20 reps   Terminal Knee Extension Limitations Rt TKE 5 sec hold x 10 ball into wall   Hip Abduction 10 reps;Knee straight;Right;2 sets   Abduction Limitations 10 reps on LLE   Forward Step Up Right;1 set;10 reps;Hand Hold: 1;Step Height: 6"   Step Down Left;2 sets;10 reps;Hand Hold: 2  1st set 3" step, 2nd set 6" step.    Step Down Limitations (and step up RLE backwards)    SLS Rt SLS on blue pad x 15 sec x 4 reps   (challenging)     Knee/Hip Exercises: Seated   Sit to Sand 10 reps;without UE support     Knee/Hip Exercises:  Supine   Quad Sets Strengthening;Right;5 reps  for ROM measurement   Heel Slides Right;10 reps  for ROM measurement     Modalities   Modalities Vasopneumatic     Vasopneumatic   Number Minutes Vasopneumatic  15 minutes   Vasopnuematic Location  Knee   Vasopneumatic Pressure Low   Vasopneumatic Temperature  3*     Manual Therapy   Kinesiotex Edema     Kinesiotix   Edema --  basket weave over ant Rt knee for edema reduction                     PT Long Term Goals - 08/12/16 8466      PT LONG TERM GOAL #1   Title Independent with advanced HEP ( 09/16/16)   Time 12   Period Weeks   Status On-going     PT LONG TERM GOAL #2   Title improve Rt Knee ROM -4 extension to 118 flexion ( 09/16/16)    Time 12   Period Weeks   Status On-going     PT LONG TERM GOAL #3   Title demo Rt LE strength =/> 5-/5 to allow alternating gait on stairs ( 09/16/16)    Time 12   Period Weeks   Status On-going     PT LONG TERM GOAL #4   Title improve gait pattern with minimal limp with improved gait pattern with walking ( 09/16/16)    Time 12   Period Weeks   Status On-going     PT LONG TERM GOAL #5   Title Maintain FOTO =/< 49% limited ( 09/16/16)    Time 12   Period Weeks   Status Achieved     PT LONG TERM GOAL #6   Title ambulate without an assistive device an no gait devations on level surfaces and up and down 3-5 steps. ( 09/16/16)    Status Partially Met  has minor gait deviations without AD.                Plan - 08/12/16 0920    Clinical Impression Statement Pt demonstrated improvement in Rt knee ROM.  She was able to tolerate increased step height for step downs.  Hip abduction remains weak bilaterally.  Pt making gradual gains towards goals.    Rehab Potential Excellent   PT Frequency 2x / week   PT Duration 6 weeks   PT  Treatment/Interventions Moist Heat;Ultrasound;Therapeutic exercise;Dry needling;Taping;Vasopneumatic Device;Manual techniques;Neuromuscular  re-education;Cryotherapy;Statistician   PT Next Visit Plan Continue progressive ROM/ strengthening for RLE; focus on ext for home program.     Consulted and Agree with Plan of Care Patient      Patient will benefit from skilled therapeutic intervention in order to improve the following deficits and impairments:  Increased edema, Decreased strength, Pain, Decreased range of motion, Difficulty walking, Decreased endurance  Visit Diagnosis: Muscle weakness (generalized)  Stiffness of right knee, not elsewhere classified  Swelling of joint, knee, right     Problem List Patient Active Problem List   Diagnosis Date Noted  . Right knee DJD 06/13/2016  . Complete rotator cuff tear 12/01/2015  . Wheezing 04/07/2014  . Impaired fasting glucose 07/26/2013  . Essential hypertension, benign 07/26/2013  . Back pain 07/26/2013  . Knee pain, bilateral 01/10/2013  . Morbid obesity ([place]) 01/10/2013  . Headache(784.0) 12/06/2012   Kerin Perna, PTA 08/12/16 9:34 AM  [facility] [place], [place], [address] Phone: [phone]   Fax:  [phone]  Name: DANARIA LARSEN MRN: [id number] Date of Birth: 05/18/1961

## 2016-08-14 ENCOUNTER — Encounter: Payer: BLUE CROSS/BLUE SHIELD | Admitting: Physical Therapy

## 2016-08-16 ENCOUNTER — Ambulatory Visit (INDEPENDENT_AMBULATORY_CARE_PROVIDER_SITE_OTHER): Payer: BLUE CROSS/BLUE SHIELD | Admitting: Physical Therapy

## 2016-08-16 ENCOUNTER — Encounter: Payer: Self-pay | Admitting: Physical Therapy

## 2016-08-16 DIAGNOSIS — M25661 Stiffness of right knee, not elsewhere classified: Secondary | ICD-10-CM

## 2016-08-16 DIAGNOSIS — M6281 Muscle weakness (generalized): Secondary | ICD-10-CM

## 2016-08-16 DIAGNOSIS — R2689 Other abnormalities of gait and mobility: Secondary | ICD-10-CM | POA: Diagnosis not present

## 2016-08-16 DIAGNOSIS — M25461 Effusion, right knee: Secondary | ICD-10-CM

## 2016-08-16 NOTE — Therapy (Signed)
[place], [place], [address] Phone: [phone]   Fax:  [phone]  Physical Therapy Treatment  Patient Details  Name: Megan Petersen MRN: [id number] Date of Birth: Aug 08, 1961 Referring Provider: Dr. Ardyth Gal  Encounter Date: 08/16/2016      PT End of Session - 08/16/16 1301    Visit Number 15   Number of Visits 24   Date for PT Re-Evaluation 09/16/16   PT Start Time 3833   PT Stop Time 1100   PT Time Calculation (min) 45 min   Activity Tolerance Patient tolerated treatment well   Behavior During Therapy [facility] for tasks assessed/performed      Past Medical History:  Diagnosis Date  . Allergy   . Asthma   . Atypical glandular cells on Pap smear   . Esophageal reflux   . Family history of adverse reaction to anesthesia    brother slow to awaken  . GERD (gastroesophageal reflux disease)    Nexium controls  . Headache   . Hemorrhoids    history of- not a problem at present.  . Hyperlipidemia   . Hypertension   . Impaired fasting glucose    Dr. Darcella Cheshire -told pt Hgb A1C level was good.  Marland Kitchen LSIL (low grade squamous intraepithelial lesion) on Pap smear   . Morbid obesity ([place])   . Osteoarthritis    Bilateral knees  . Osteoarthritis   . Pneumonia 2016  . Primary cough headache   . Rosacea   . Swelling of limb   . Transfusion history    '88- s/p childbirth  . Trigeminal neuralgia   . URI (upper respiratory infection)    cough with green sputum since 11-26-15  . Vertigo    10 yrs ago- only-    Past Surgical History:  Procedure Laterality Date  . CHOLECYSTECTOMY     history of gallstones  . LEEP    . right knee meniscus repair  2014  . SHOULDER OPEN ROTATOR CUFF REPAIR Right 12/01/2015   Procedure: RIGHT SHOULDER MINI OPEN ROTATOR CUFF REPAIR WITH RESECTON OF DISTAL CLAVICLE AND SUBACROMIAL DECOMPRESSION;  Surgeon: Susa Day, MD;  Location: [facility];  Service: Orthopedics;   Laterality: Right;  . TOTAL KNEE ARTHROPLASTY Right 06/13/2016   Procedure: RIGHT TOTAL KNEE ARTHROPLASTY;  Surgeon: Susa Day, MD;  Location: [facility];  Service: Orthopedics;  Laterality: Right;    There were no vitals filed for this visit.      Subjective Assessment - 08/16/16 1253    Subjective Pt has been doing her HEP and trying to focus on improvements in ROM. No pain reported.    Patient Stated Goals sleep better, get rid of pain, walk normal.    Currently in Pain? No/denies   Aggravating Factors  bending the knee   Pain Relieving Factors rest, sitting with knee bent   Multiple Pain Sites No            [facility] PT Assessment - 08/16/16 0001      Assessment   Medical Diagnosis Rt TKA   Referring Provider Dr. Ardyth Gal   Onset Date/Surgical Date 06/13/16   Hand Dominance Right   Next MD Visit 09/12/16     AROM   AROM Assessment Site Knee   Right/Left Knee Right   Right Knee Extension -10   Right Knee Flexion 118  [place] Adult PT Treatment/Exercise - 08/16/16 0001      Knee/Hip Exercises: Stretches   Passive Hamstring Stretch Right;30 seconds;4 reps     Knee/Hip Exercises: Aerobic   Recumbent Bike L1 6 minutes     Knee/Hip Exercises: Standing   Terminal Knee Extension Limitations Right TKE holding 5 seconds x 10 reps     Knee/Hip Exercises: Supine   Quad Sets 20 reps   Heel Slides 15 reps   Straight Leg Raises 2 sets;10 reps     Moist Heat Therapy   Number Minutes Moist Heat 15 Minutes   Moist Heat Location Knee  hamstring/ posterior knee     Cryotherapy   Number Minutes Cryotherapy 15 Minutes   Cryotherapy Location Knee   Type of Cryotherapy Ice pack  anterior knee     Manual Therapy   Manual therapy comments  scar tissue cross friction   Soft tissue mobilization w   Passive ROM overpressure in supine with ankle on bolster to assist with exention   Kinesiotex Edema     Kinesiotix   Edema --  basket weave  over ant Rt knee for edema reduction                PT Education - 08/16/16 1300    Education provided Yes   Education Details HEP, focusing on ROM both flexion and extension    Person(s) Educated Patient   Methods Explanation;Demonstration   Comprehension Verbalized understanding          PT Short Term Goals - 08/16/16 1320      PT SHORT TERM GOAL #1   Title Independent with initial HEP ( 01/17/16)    Time 4   Period Weeks   Status Achieved     PT SHORT TERM GOAL #2   Title demo Rt shoulder PROM to [facility] ( 01/31/16)    Time 6   Period Weeks   Status Achieved     PT SHORT TERM GOAL #3   Title tolerate inital Rt shoulder strengthening exercise ( 01/31/16)    Time 6   Period Weeks   Status Achieved     PT SHORT TERM GOAL #4   Title improve FOTO =/< 50% limited ( 01/31/16)    Time 6   Period Weeks   Status Achieved           PT Long Term Goals - 08/16/16 1320      PT LONG TERM GOAL #1   Title Independent with advanced HEP ( 09/16/16)   Time 12   Period Weeks   Status On-going     PT LONG TERM GOAL #2   Title improve Rt Knee ROM -4 extension to 118 flexion ( 09/16/16)    Time 12   Period Weeks   Status On-going     PT LONG TERM GOAL #3   Title demo Rt LE strength =/> 5-/5 to allow alternating gait on stairs ( 09/16/16)    Time 12   Period Weeks   Status On-going     PT LONG TERM GOAL #4   Title improve gait pattern with minimal limp with improved gait pattern with walking ( 09/16/16)    Time 12   Period Weeks   Status On-going     PT LONG TERM GOAL #5   Title Maintain FOTO =/< 49% limited ( 09/16/16)    Time 12   Period Weeks   Status Achieved     PT LONG TERM GOAL #6  Title ambulate without an assistive device an no gait devations on level surfaces and up and down 3-5 steps. ( 09/16/16)    Time 12   Period Weeks   Status Partially Met               Plan - 08/16/16 1301    Clinical Impression Statement Pt demonstrated improvement in  Right knee ROM.    PT Frequency 2x / week   PT Duration 6 weeks   PT Treatment/Interventions Moist Heat;Ultrasound;Therapeutic exercise;Dry needling;Taping;Vasopneumatic Device;Manual techniques;Neuromuscular re-education;Cryotherapy;Electrical Stimulation;Gait training;Stair training;Patient/family education;Scar mobilization   PT Next Visit Plan Continue progressive ROM/ strengthening for RLE; focus on ext for home program.     PT Home Exercise Plan pt to perform self massage to Rt hamstring and focus on extension.     Consulted and Agree with Plan of Care Patient      Patient will benefit from skilled therapeutic intervention in order to improve the following deficits and impairments:  Increased edema, Decreased strength, Pain, Decreased range of motion, Difficulty walking, Decreased endurance  Visit Diagnosis: Muscle weakness (generalized)  Stiffness of right knee, not elsewhere classified  Swelling of joint, knee, right  Other abnormalities of gait and mobility     Problem List Patient Active Problem List   Diagnosis Date Noted  . Right knee DJD 06/13/2016  . Complete rotator cuff tear 12/01/2015  . Wheezing 04/07/2014  . Impaired fasting glucose 07/26/2013  . Essential hypertension, benign 07/26/2013  . Back pain 07/26/2013  . Knee pain, bilateral 01/10/2013  . Morbid obesity ([place]) 01/10/2013  . Headache(784.0) 12/06/2012    Oretha Caprice, MPT 08/16/2016, 1:31 PM  [facility] [place], [place], [address] Phone: [phone]   Fax:  [phone]  Name: Megan Petersen MRN: [id number] Date of Birth: [birth year]

## 2016-08-22 ENCOUNTER — Ambulatory Visit (INDEPENDENT_AMBULATORY_CARE_PROVIDER_SITE_OTHER): Payer: BLUE CROSS/BLUE SHIELD | Admitting: Physical Therapy

## 2016-08-22 ENCOUNTER — Encounter: Payer: Self-pay | Admitting: Physical Therapy

## 2016-08-22 DIAGNOSIS — R2689 Other abnormalities of gait and mobility: Secondary | ICD-10-CM

## 2016-08-22 DIAGNOSIS — M25461 Effusion, right knee: Secondary | ICD-10-CM

## 2016-08-22 DIAGNOSIS — M6281 Muscle weakness (generalized): Secondary | ICD-10-CM

## 2016-08-22 DIAGNOSIS — M25661 Stiffness of right knee, not elsewhere classified: Secondary | ICD-10-CM

## 2016-08-22 NOTE — Therapy (Signed)
[place], [place], [address] Phone: [phone]   Fax:  [phone]  Physical Therapy Treatment  Patient Details  Name: Megan Petersen MRN: [id number] Date of Birth: 04-May-1961 Referring Provider: Dr Tonita Cong  Encounter Date: 08/22/2016      PT End of Session - 08/22/16 0934    Visit Number 16   Number of Visits 24   Date for PT Re-Evaluation 09/16/16   PT Start Time 0934   PT Stop Time 1029   PT Time Calculation (min) 55 min   Activity Tolerance Patient tolerated treatment well      Past Medical History:  Diagnosis Date  . Allergy   . Asthma   . Atypical glandular cells on Pap smear   . Esophageal reflux   . Family history of adverse reaction to anesthesia    brother slow to awaken  . GERD (gastroesophageal reflux disease)    Nexium controls  . Headache   . Hemorrhoids    history of- not a problem at present.  . Hyperlipidemia   . Hypertension   . Impaired fasting glucose    Dr. Darcella Cheshire -told pt Hgb A1C level was good.  Marland Kitchen LSIL (low grade squamous intraepithelial lesion) on Pap smear   . Morbid obesity ([place])   . Osteoarthritis    Bilateral knees  . Osteoarthritis   . Pneumonia 2016  . Primary cough headache   . Rosacea   . Swelling of limb   . Transfusion history    '88- s/p childbirth  . Trigeminal neuralgia   . URI (upper respiratory infection)    cough with green sputum since 11-26-15  . Vertigo    10 yrs ago- only-    Past Surgical History:  Procedure Laterality Date  . CHOLECYSTECTOMY     history of gallstones  . LEEP    . right knee meniscus repair  2014  . SHOULDER OPEN ROTATOR CUFF REPAIR Right 12/01/2015   Procedure: RIGHT SHOULDER MINI OPEN ROTATOR CUFF REPAIR WITH RESECTON OF DISTAL CLAVICLE AND SUBACROMIAL DECOMPRESSION;  Surgeon: Susa Day, MD;  [facility]: [facility];  Service: Orthopedics;  Laterality: Right;  . TOTAL KNEE ARTHROPLASTY Right 06/13/2016   Procedure: RIGHT TOTAL KNEE ARTHROPLASTY;  Surgeon: Susa Day, MD;  Location[facility];  Service: Orthopedics;  Laterality: Right;    There were no vitals filed for this visit.      Subjective Assessment - 08/22/16 0942    Subjective Pt is doing pretty good, no pain today so far   Currently in Pain? No/denies            [facility] PT Assessment - 08/22/16 0001      Assessment   Medical Diagnosis Rt TKA   Referring Provider Dr Tonita Cong   Onset Date/Surgical Date 06/13/16   Next MD Visit 09/05/16     AROM   AROM Assessment Site Knee   Right/Left Knee Right   Right Knee Extension -8   Right Knee Flexion 116                     [facility] Adult PT Treatment/Exercise - 08/22/16 0001      Knee/Hip Exercises: Aerobic   Recumbent Bike L2 6 minutes     Knee/Hip Exercises: Standing   Lateral Step Up Right;2 sets;10 reps;Step Height: 8"   Forward Step Up Right;2 sets;10 reps;Step Height: 8"   Step Down Right;2 sets;10 reps;Step Height: 4"   Other Standing  Knee Exercises 30 reps BWD steps to work on knee extension      Knee/Hip Exercises: Seated   Long Arc Quad Strengthening;Right;3 sets;10 reps;Weights   Long Arc Quad Weight 5 lbs.   Hamstring Curl Strengthening;Right;3 sets;10 reps  green t-band     Knee/Hip Exercises: Supine   Quad Sets Strengthening;Right;10 reps     Modalities   Modalities Cryotherapy;Moist Heat     Moist Heat Therapy   Number Minutes Moist Heat 15 Minutes   Moist Heat Location Knee  posterior     Cryotherapy   Number Minutes Cryotherapy 15 Minutes   Cryotherapy Location Knee  anterior   Type of Cryotherapy Ice pack                  PT Short Term Goals - 08/22/16 0945      PT SHORT TERM GOAL #1   Title Independent with initial HEP ( 01/17/16)    Status Achieved     PT SHORT TERM GOAL #2   Title demo Rt shoulder PROM to [facility] ( 01/31/16)    Status Achieved     PT SHORT TERM GOAL #3   Title tolerate inital Rt shoulder  strengthening exercise ( 01/31/16)    Status Achieved     PT SHORT TERM GOAL #4   Title improve FOTO =/< 50% limited ( 01/31/16)    Status Achieved           PT Long Term Goals - 08/22/16 0945      PT LONG TERM GOAL #1   Title Independent with advanced HEP ( 09/16/16)   Status On-going     PT LONG TERM GOAL #2   Title improve Rt Knee ROM -4 extension to 118 flexion ( 09/16/16)    Status On-going     PT LONG TERM GOAL #3   Title demo Rt LE strength =/> 5-/5 to allow alternating gait on stairs ( 09/16/16)    Status On-going     PT LONG TERM GOAL #4   Title improve gait pattern with minimal limp with improved gait pattern with walking ( 09/16/16)    Status Achieved     PT LONG TERM GOAL #5   Title Maintain FOTO =/< 49% limited ( 09/16/16)    Status On-going     PT LONG TERM GOAL #6   Title ambulate without an assistive device an no gait devations on level surfaces and up and down 3-5 steps. ( 09/16/16)    Status Partially Met               Plan - 08/22/16 1012    Clinical Impression Statement Naarah continues with steady improvements.  She is limited most in knee extension.  Has met another goal     Rehab Potential Excellent   PT Frequency 2x / week   PT Duration 6 weeks   PT Treatment/Interventions Moist Heat;Ultrasound;Therapeutic exercise;Dry needling;Taping;Vasopneumatic Device;Manual techniques;Neuromuscular re-education;Cryotherapy;Electrical Stimulation;Gait training;Stair training;Patient/family education;Scar mobilization   PT Next Visit Plan Continue progressive ROM/ strengthening for RLE; focus on ext for home program.     Consulted and Agree with Plan of Care Patient      Patient will benefit from skilled therapeutic intervention in order to improve the following deficits and impairments:  Increased edema, Decreased strength, Pain, Decreased range of motion, Difficulty walking, Decreased endurance  Visit Diagnosis: Muscle weakness (generalized)  Stiffness  of right knee, not elsewhere classified  Swelling of joint, knee, right  Other abnormalities of  gait and mobility     Problem List Patient Active Problem List   Diagnosis Date Noted  . Right knee DJD 06/13/2016  . Complete rotator cuff tear 12/01/2015  . Wheezing 04/07/2014  . Impaired fasting glucose 07/26/2013  . Essential hypertension, benign 07/26/2013  . Back pain 07/26/2013  . Knee pain, bilateral 01/10/2013  . Morbid obesity ([place]) 01/10/2013  . Headache(784.0) 12/06/2012    Jeral Pinch PT 08/22/2016, 10:15 AM  [facility] [place], [place], [address] Phone: [phone]   Fax:  [phone]  Name: REMA LIEVANOS MRN: [id number] Date of Birth: 1961/07/16

## 2016-08-27 ENCOUNTER — Ambulatory Visit (INDEPENDENT_AMBULATORY_CARE_PROVIDER_SITE_OTHER): Payer: BLUE CROSS/BLUE SHIELD | Admitting: Physical Therapy

## 2016-08-27 DIAGNOSIS — M25461 Effusion, right knee: Secondary | ICD-10-CM | POA: Diagnosis not present

## 2016-08-27 DIAGNOSIS — M25661 Stiffness of right knee, not elsewhere classified: Secondary | ICD-10-CM

## 2016-08-27 DIAGNOSIS — M6281 Muscle weakness (generalized): Secondary | ICD-10-CM

## 2016-08-27 NOTE — Therapy (Signed)
[name], [place], [address] Phone: [phone]   Fax:  [phone]  Physical Therapy Treatment  Patient Details  Name: Megan Petersen MRN: [id number] Date of Birth: 09-24-1960 Referring Provider: Dr. Tonita Cong   Encounter Date: 08/27/2016      PT End of Session - 08/27/16 0935    Visit Number 17   Number of Visits 24   Date for PT Re-Evaluation 09/16/16   PT Start Time 0931   PT Stop Time 1029   PT Time Calculation (min) 58 min   Activity Tolerance Patient tolerated treatment well   Behavior During Therapy [facility] for tasks assessed/performed      Past Medical History:  Diagnosis Date  . Allergy   . Asthma   . Atypical glandular cells on Pap smear   . Esophageal reflux   . Family history of adverse reaction to anesthesia    brother slow to awaken  . GERD (gastroesophageal reflux disease)    Nexium controls  . Headache   . Hemorrhoids    history of- not a problem at present.  . Hyperlipidemia   . Hypertension   . Impaired fasting glucose    Dr. Darcella Cheshire -told pt Hgb A1C level was good.  Marland Kitchen LSIL (low grade squamous intraepithelial lesion) on Pap smear   . Morbid obesity ([place])   . Osteoarthritis    Bilateral knees  . Osteoarthritis   . Pneumonia 2016  . Primary cough headache   . Rosacea   . Swelling of limb   . Transfusion history    '88- s/p childbirth  . Trigeminal neuralgia   . URI (upper respiratory infection)    cough with green sputum since 11-26-15  . Vertigo    10 yrs ago- only-    Past Surgical History:  Procedure Laterality Date  . CHOLECYSTECTOMY     history of gallstones  . LEEP    . right knee meniscus repair  2014  . SHOULDER OPEN ROTATOR CUFF REPAIR Right 12/01/2015   Procedure: RIGHT SHOULDER MINI OPEN ROTATOR CUFF REPAIR WITH RESECTON OF DISTAL CLAVICLE AND SUBACROMIAL DECOMPRESSION;  Surgeon: Susa Day, MD;  [facility];  Service: Orthopedics;   Laterality: Right;  . TOTAL KNEE ARTHROPLASTY Right 06/13/2016   Procedure: RIGHT TOTAL KNEE ARTHROPLASTY;  Surgeon: Susa Day, MD;  Location: [facility];  Service: Orthopedics;  Laterality: Right;    There were no vitals filed for this visit.      Subjective Assessment - 08/27/16 0935    Subjective Pt reports she was sore for a few days after last session.  She hasn't slept in recliner in a week.  Now able to sleep on stomach without difficulty.     Currently in Pain? No/denies   Pain Score 0-No pain   Pain Location Knee   Pain Orientation Right            [facility] PT Assessment - 08/27/16 0001      Assessment   Medical Diagnosis Rt TKA   Referring Provider Dr. Tonita Cong    Onset Date/Surgical Date 06/13/16   Hand Dominance Right   Next MD Visit 09/05/16     AROM   AROM Assessment Site Knee   Right/Left Knee Right   Right Knee Extension -7   Right Knee Flexion 115            [facility] Adult PT Treatment/Exercise - 08/27/16 0001      Knee/Hip Exercises: Stretches  Passive Hamstring Stretch Right;3 reps;30 seconds  with overpressure of her hands    Gastroc Stretch Right;Left;2 reps;30 seconds     Knee/Hip Exercises: Aerobic   Recumbent Bike L2 6 minutes     Knee/Hip Exercises: Machines for Strengthening   Cybex Knee Extension 1 plate with RLE x 15 reps; 2 plates with BLE to ext, RLE to return x 10 reps      Knee/Hip Exercises: Standing   Lateral Step Up Right;2 sets;10 reps;Hand Hold: 1;Step Height: 6"   Step Down Left;1 set;Hand Hold: 2;Step Height: 6";10 reps  and backwards step with Rt foot.    SLS multiple trials on RLE (still challenging)   Other Standing Knee Exercises Walking backward on treadmill for 2 min, @ 0.8 mph.       Knee/Hip Exercises: Seated   Other Seated Knee/Hip Exercises Seated scoots x 15 sec hold x  5 reps    Hamstring Curl Strengthening;Right;10 reps;2 sets  green t-band     Modalities   Modalities Cryotherapy;Moist Heat     Moist Heat  Therapy   Number Minutes Moist Heat 15 Minutes   Moist Heat Location Knee  posterior     Cryotherapy   Number Minutes Cryotherapy 15 Minutes   Cryotherapy Location Knee  anterior   Type of Cryotherapy Ice pack     Manual Therapy   Passive ROM overpressure in supine with ankle on bolster to assist with exention                  PT Short Term Goals - 08/22/16 0945      PT SHORT TERM GOAL #1   Title Independent with initial HEP ( 01/17/16)    Status Achieved     PT SHORT TERM GOAL #2   Title demo Rt shoulder PROM to [facility] ( 01/31/16)    Status Achieved     PT SHORT TERM GOAL #3   Title tolerate inital Rt shoulder strengthening exercise ( 01/31/16)    Status Achieved     PT SHORT TERM GOAL #4   Title improve FOTO =/< 50% limited ( 01/31/16)    Status Achieved           PT Long Term Goals - 08/27/16 1223      PT LONG TERM GOAL #1   Title Independent with advanced HEP ( 09/16/16)   Time 12   Period Weeks   Status On-going     PT LONG TERM GOAL #2   Title improve Rt Knee ROM -4 extension to 118 flexion ( 09/16/16)    Time 12   Period Weeks   Status On-going     PT LONG TERM GOAL #3   Title demo Rt LE strength =/> 5-/5 to allow alternating gait on stairs ( 09/16/16)    Time 12   Period Weeks   Status On-going     PT LONG TERM GOAL #4   Title improve gait pattern with minimal limp with improved gait pattern with walking ( 09/16/16)    Time 12   Period Weeks   Status Achieved     PT LONG TERM GOAL #5   Title Maintain FOTO =/< 49% limited ( 09/16/16)    Time 12   Period Weeks   Status On-going     PT LONG TERM GOAL #6   Title ambulate without an assistive device an no gait devations on level surfaces and up and down 3-5 steps. ( 09/16/16)    Time  12   Period Weeks   Status Achieved               Plan - 08/27/16 1220    Clinical Impression Statement Pt's Rt knee ROM similar to last session; continues to be limited in knee extension.   She is  demonstrating less deviations with gait and stairs; has met LTG # 6.  She tolerated all exercises well, with minimal increase in pain.     Rehab Potential Excellent   PT Frequency 2x / week   PT Duration 6 weeks   PT Treatment/Interventions Moist Heat;Ultrasound;Therapeutic exercise;Dry needling;Taping;Vasopneumatic Device;Manual techniques;Neuromuscular re-education;Cryotherapy;Electrical Stimulation;Gait training;Stair training;Patient/family education;Scar mobilization   PT Next Visit Plan Measure Rt LE strength.  Continue progressive strengthening/stretching for Rt knee.    Consulted and Agree with Plan of Care Patient      Patient will benefit from skilled therapeutic intervention in order to improve the following deficits and impairments:  Increased edema, Decreased strength, Pain, Decreased range of motion, Difficulty walking, Decreased endurance  Visit Diagnosis: Muscle weakness (generalized)  Stiffness of right knee, not elsewhere classified  Swelling of joint, knee, right     Problem List Patient Active Problem List   Diagnosis Date Noted  . Right knee DJD 06/13/2016  . Complete rotator cuff tear 12/01/2015  . Wheezing 04/07/2014  . Impaired fasting glucose 07/26/2013  . Essential hypertension, benign 07/26/2013  . Back pain 07/26/2013  . Knee pain, bilateral 01/10/2013  . Morbid obesity ([place]) 01/10/2013  . Headache(784.0) 12/06/2012   Kerin Perna, PTA 08/27/16 12:27 PM  [place], [place], [address] Phone: [phone]   Fax:  [phone]  Name: SUPRENA TRAVAGLINI MRN: [id number] Date of Birth: Apr 05, 1961

## 2016-08-30 ENCOUNTER — Ambulatory Visit (INDEPENDENT_AMBULATORY_CARE_PROVIDER_SITE_OTHER): Payer: BLUE CROSS/BLUE SHIELD | Admitting: Physical Therapy

## 2016-08-30 DIAGNOSIS — M6281 Muscle weakness (generalized): Secondary | ICD-10-CM | POA: Diagnosis not present

## 2016-08-30 DIAGNOSIS — M25661 Stiffness of right knee, not elsewhere classified: Secondary | ICD-10-CM

## 2016-08-30 DIAGNOSIS — M25461 Effusion, right knee: Secondary | ICD-10-CM

## 2016-08-30 NOTE — Therapy (Signed)
[facility] [address] [place], [place], [address] Phone: [phone]   Fax:  [phone]  Physical Therapy Treatment  Patient Details  Name: Megan Petersen MRN: [id number] Date of Birth: October 11, 1960 Referring Provider: Dr. Shelle Iron  Encounter Date: 08/30/2016      PT End of Session - 08/30/16 0937    Visit Number 18   Number of Visits 24   Date for PT Re-Evaluation 09/16/16   PT Start Time 0932   PT Stop Time 1033  last 12 min, MHP   PT Time Calculation (min) 61 min   Activity Tolerance Patient tolerated treatment well      Past Medical History:  Diagnosis Date  . Allergy   . Asthma   . Atypical glandular cells on Pap smear   . Esophageal reflux   . Family history of adverse reaction to anesthesia    brother slow to awaken  . GERD (gastroesophageal reflux disease)    Nexium controls  . Headache   . Hemorrhoids    history of- not a problem at present.  . Hyperlipidemia   . Hypertension   . Impaired fasting glucose    Dr. Jenny Reichmann -told pt Hgb A1C level was good.  Marland Kitchen LSIL (low grade squamous intraepithelial lesion) on Pap smear   . Morbid obesity (HCC)   . Osteoarthritis    Bilateral knees  . Osteoarthritis   . Pneumonia 2016  . Primary cough headache   . Rosacea   . Swelling of limb   . Transfusion history    '88- s/p childbirth  . Trigeminal neuralgia   . URI (upper respiratory infection)    cough with green sputum since 11-26-15  . Vertigo    10 yrs ago- only-    Past Surgical History:  Procedure Laterality Date  . CHOLECYSTECTOMY     history of gallstones  . LEEP    . right knee meniscus repair  2014  . SHOULDER OPEN ROTATOR CUFF REPAIR Right 12/01/2015   Procedure: RIGHT SHOULDER MINI OPEN ROTATOR CUFF REPAIR WITH RESECTON OF DISTAL CLAVICLE AND SUBACROMIAL DECOMPRESSION;  Surgeon: Jene Every, MD;  Location: [facility];  Service: Orthopedics;  Laterality: Right;  . TOTAL KNEE ARTHROPLASTY  Right 06/13/2016   Procedure: RIGHT TOTAL KNEE ARTHROPLASTY;  Surgeon: Jene Every, MD;  Location: [facility];  Service: Orthopedics;  Laterality: Right;    There were no vitals filed for this visit.      Subjective Assessment - 08/30/16 0938    Subjective "My (Rt) knee feels puffy today, not sure why".  Pt has nothing new to report, except increased swelling.     Currently in Pain? No/denies   Pain Score 0-No pain            [facility] PT Assessment - 08/30/16 0001      Assessment   Medical Diagnosis Rt TKA   Referring Provider Dr. Shelle Iron   Onset Date/Surgical Date 06/13/16   Hand Dominance Right   Next MD Visit 09/05/16     AROM   AROM Assessment Site Knee   Right/Left Knee Right   Right Knee Extension -6          [facility] Adult PT Treatment/Exercise - 08/30/16 0001      Knee/Hip Exercises: Stretches   Passive Hamstring Stretch Right;2 reps;30 seconds   Gastroc Stretch Right;Left;3 reps     Knee/Hip Exercises: Aerobic   Recumbent Bike L2 6.5 minutes     Knee/Hip Exercises: Machines for  Strengthening   Cybex Knee Extension 1 plate BLE x 10, then RLE only x 10 (2 plates too difficult today)     Knee/Hip Exercises: Standing   SLS multiple trials on RLE (still challenging, up to 3 sec)   Other Standing Knee Exercises tandem stance with horiz head turns x 15 sec x 2 reps each side.    Other Standing Knee Exercises TKE RLE with green band behind knee x 5 sec x 10 reps      Knee/Hip Exercises: Prone   Hamstring Curl 2 sets;10 reps   Hamstring Curl Limitations 2.5# first set, 4# second set    Prone Knee Hang 2 minutes  x 2   Prone Knee Hang Weights (lbs) --  2.5#     Moist Heat Therapy   Number Minutes Moist Heat 15 Minutes   Moist Heat Location Knee  posterior     Cryotherapy   Number Minutes Cryotherapy 15 Minutes   Cryotherapy Location Knee  anterior   Type of Cryotherapy Ice pack     Ultrasound   Ultrasound Location Rt posterior knee    Ultrasound Parameters  100%, 1.2w/cm2, 8 min    Ultrasound Goals Other (Comment)  tightness.     Manual Therapy   Soft tissue mobilization muscle stripping to Rt hamstrings, Rt calf.     Passive ROM overpressure in supine with ankle on bolster to assist with exention   Kinesiotex [employer]     [employer] I strip of Rock tape applied to Rt lateral quad/ knee to decompress tissue, decrease localized pain.                    PT Short Term Goals - 08/22/16 0945      PT SHORT TERM GOAL #1   Title Independent with initial HEP ( 01/17/16)    Status Achieved     PT SHORT TERM GOAL #2   Title demo Rt shoulder PROM to [facility] ( 01/31/16)    Status Achieved     PT SHORT TERM GOAL #3   Title tolerate inital Rt shoulder strengthening exercise ( 01/31/16)    Status Achieved     PT SHORT TERM GOAL #4   Title improve FOTO =/< 50% limited ( 01/31/16)    Status Achieved           PT Long Term Goals - 08/27/16 1223      PT LONG TERM GOAL #1   Title Independent with advanced HEP ( 09/16/16)   Time 12   Period Weeks   Status On-going     PT LONG TERM GOAL #2   Title improve Rt Knee ROM -4 extension to 118 flexion ( 09/16/16)    Time 12   Period Weeks   Status On-going     PT LONG TERM GOAL #3   Title demo Rt LE strength =/> 5-/5 to allow alternating gait on stairs ( 09/16/16)    Time 12   Period Weeks   Status On-going     PT LONG TERM GOAL #4   Title improve gait pattern with minimal limp with improved gait pattern with walking ( 09/16/16)    Time 12   Period Weeks   Status Achieved     PT LONG TERM GOAL #5   Title Maintain FOTO =/< 49% limited ( 09/16/16)    Time 12   Period Weeks   Status On-going     PT LONG TERM GOAL #  6   Title ambulate without an assistive device an no gait devations on level surfaces and up and down 3-5 steps. ( 09/16/16)    Time 12   Period Weeks   Status Achieved               Plan - 08/30/16 1033    Rehab Potential Excellent   PT  Frequency 2x / week   PT Treatment/Interventions Moist Heat;Ultrasound;Therapeutic exercise;Dry needling;Taping;Vasopneumatic Device;Manual techniques;Neuromuscular re-education;Cryotherapy;Electrical Stimulation;Gait training;Stair training;Patient/family education;Scar mobilization   PT Next Visit Plan Measure Rt LE strength.  Continue progressive strengthening/stretching for Rt knee.    Consulted and Agree with Plan of Care Patient      Patient will benefit from skilled therapeutic intervention in order to improve the following deficits and impairments:  Increased edema, Decreased strength, Pain, Decreased range of motion, Difficulty walking, Decreased endurance  Visit Diagnosis: Muscle weakness (generalized)  Stiffness of right knee, not elsewhere classified  Swelling of joint, knee, right     Problem List Patient Active Problem List   Diagnosis Date Noted  . Right knee DJD 06/13/2016  . Complete rotator cuff tear 12/01/2015  . Wheezing 04/07/2014  . Impaired fasting glucose 07/26/2013  . Essential hypertension, benign 07/26/2013  . Back pain 07/26/2013  . Knee pain, bilateral 01/10/2013  . Morbid obesity (HCC) 01/10/2013  . Headache(784.0) 12/06/2012   Mayer CamelJennifer Carlson-Long, PTA 08/30/16 10:36 AM   [facility] [address] [place], [place], [address] Phone: [phone]   Fax:  [phone]  Name: Rinaldo RatelRhonda R Disch MRN: [id number] Date of Birth: 1961/01/30

## 2016-09-03 ENCOUNTER — Ambulatory Visit (INDEPENDENT_AMBULATORY_CARE_PROVIDER_SITE_OTHER): Payer: BLUE CROSS/BLUE SHIELD | Admitting: Physical Therapy

## 2016-09-03 DIAGNOSIS — M25461 Effusion, right knee: Secondary | ICD-10-CM

## 2016-09-03 DIAGNOSIS — M25661 Stiffness of right knee, not elsewhere classified: Secondary | ICD-10-CM | POA: Diagnosis not present

## 2016-09-03 DIAGNOSIS — M6281 Muscle weakness (generalized): Secondary | ICD-10-CM

## 2016-09-03 NOTE — Therapy (Addendum)
[place], [place], [address] Phone: [phone]   Fax:  [phone]  Physical Therapy Treatment  Patient Details  Name: Megan Petersen MRN: [id number] Date of Birth: Jan 04, 1961 Referring Provider: Dr. Tonita Cong  Encounter Date: 09/03/2016      PT End of Session - 09/03/16 1025    Visit Number 19   Number of Visits 24   Date for PT Re-Evaluation 09/16/16   PT Start Time 1018   PT Stop Time 1116   PT Time Calculation (min) 58 min   Activity Tolerance Patient tolerated treatment well;No increased pain   Behavior During Therapy WFL for tasks assessed/performed      Past Medical History:  Diagnosis Date  . Allergy   . Asthma   . Atypical glandular cells on Pap smear   . Esophageal reflux   . Family history of adverse reaction to anesthesia    brother slow to awaken  . GERD (gastroesophageal reflux disease)    Nexium controls  . Headache   . Hemorrhoids    history of- not a problem at present.  . Hyperlipidemia   . Hypertension   . Impaired fasting glucose    Dr. Darcella Cheshire -told pt Hgb A1C level was good.  Marland Kitchen LSIL (low grade squamous intraepithelial lesion) on Pap smear   . Morbid obesity ([place])   . Osteoarthritis    Bilateral knees  . Osteoarthritis   . Pneumonia 2016  . Primary cough headache   . Rosacea   . Swelling of limb   . Transfusion history    '88- s/p childbirth  . Trigeminal neuralgia   . URI (upper respiratory infection)    cough with green sputum since 11-26-15  . Vertigo    10 yrs ago- only-    Past Surgical History:  Procedure Laterality Date  . CHOLECYSTECTOMY     history of gallstones  . LEEP    . right knee meniscus repair  2014  . SHOULDER OPEN ROTATOR CUFF REPAIR Right 12/01/2015   Procedure: RIGHT SHOULDER MINI OPEN ROTATOR CUFF REPAIR WITH RESECTON OF DISTAL CLAVICLE AND SUBACROMIAL DECOMPRESSION;  Surgeon: Susa Day, MD;  Location: [facility];  Service:  Orthopedics;  Laterality: Right;  . TOTAL KNEE ARTHROPLASTY Right 06/13/2016   Procedure: RIGHT TOTAL KNEE ARTHROPLASTY;  Surgeon: Susa Day, MD;  Location: [facility];  Service: Orthopedics;  Laterality: Right;    There were no vitals filed for this visit.      Subjective Assessment - 09/03/16 1026    Subjective Pt reports the puffiness in Rt knee has gone down.  "It doesn't feel as stiff when I bend it".    Currently in Pain? No/denies   Pain Score 0-No pain            [facility] PT Assessment - 09/03/16 0001      Assessment   Medical Diagnosis Rt TKA   Referring Provider Dr. Tonita Cong   Onset Date/Surgical Date 06/13/16   Hand Dominance Right   Next MD Visit 09/05/16     Observation/Other Assessments   Focus on Therapeutic Outcomes (FOTO)  49% limited.      AROM   AROM Assessment Site Knee   Right/Left Knee Right   Right Knee Extension -4  Long sitting, with quad set   Right Knee Flexion 115     Strength   Right Hip Flexion --  5-/5   Right Hip Extension --  5-/5   Right Hip  ABduction --  5-/5   Right/Left Knee Right   Right Knee Flexion 5/5   Right Knee Extension 5/5                     [facility] Adult PT Treatment/Exercise - 09/03/16 0001      Knee/Hip Exercises: Stretches   Passive Hamstring Stretch Right;5 reps;10 seconds   Quad Stretch Right;30 seconds;4 reps   Gastroc Stretch Right;Left;3 reps     Knee/Hip Exercises: Aerobic   Tread Mill Backwards: -0.8 mph x 2 min    Recumbent Bike L3: 5 min      Knee/Hip Exercises: Machines for Strengthening   Cybex Knee Extension 1 plate with RLE x 5 reps; 2 plates with BLE to ext, RLE to return x 10 reps, then RLE only 2 plates x 5 reps    Cybex Leg Press 5 plates x 10; 7 plates x 10      Knee/Hip Exercises: Standing   Lateral Step Up Right;1 set;10 reps;Hand Hold: 1;Step Height: 8"   Forward Step Up Right;1 set;10 reps;Hand Hold: 1;Step Height: 8"     Knee/Hip Exercises: Supine   Quad Sets  Strengthening;Right;1 set;5 reps     Vasopneumatic   Number Minutes Vasopneumatic  15 minutes   Vasopnuematic Location  Knee   Vasopneumatic Pressure Low   Vasopneumatic Temperature  3*                  PT Short Term Goals - 08/22/16 0945      PT SHORT TERM GOAL #1   Title Independent with initial HEP ( 01/17/16)    Status Achieved     PT SHORT TERM GOAL #2   Title demo Rt shoulder PROM to [facility] ( 01/31/16)    Status Achieved     PT SHORT TERM GOAL #3   Title tolerate inital Rt shoulder strengthening exercise ( 01/31/16)    Status Achieved     PT SHORT TERM GOAL #4   Title improve FOTO =/< 50% limited ( 01/31/16)    Status Achieved           PT Long Term Goals - 09/03/16 1058      PT LONG TERM GOAL #1   Title Independent with advanced HEP ( 09/16/16)   Time 12   Period Weeks   Status On-going     PT LONG TERM GOAL #2   Title improve Rt Knee ROM -4 extension to 118 flexion ( 09/16/16)    Time 12   Period Weeks   Status Partially Met     PT LONG TERM GOAL #3   Title demo Rt LE strength =/> 5-/5 to allow alternating gait on stairs ( 09/16/16)    Time 12   Period Weeks   Status Achieved     PT LONG TERM GOAL #4   Title improve gait pattern with minimal limp with improved gait pattern with walking ( 09/16/16)    Time 12   Period Weeks   Status Achieved     PT LONG TERM GOAL #5   Title Maintain FOTO =/< 49% limited ( 09/16/16)    Time 12   Period Weeks   Status Achieved     PT LONG TERM GOAL #6   Title ambulate without an assistive device an no gait devations on level surfaces and up and down 3-5 steps. ( 09/16/16)    Time 12   Period Weeks   Status Achieved  Plan - 09/03/16 1303    Clinical Impression Statement Pt's Rt knee ROM gradually improving.  Her Rt LE strength has improved; she has met LTG #3.  She has partially met her goals and making great progress towards remaining goals.  She tolerated all exercises with minimal increase  in pain in LE. Pt requests to hold therapy until after MD appt.     Rehab Potential Excellent   PT Frequency 2x / week   PT Duration 6 weeks   PT Treatment/Interventions Moist Heat;Ultrasound;Therapeutic exercise;Dry needling;Taping;Vasopneumatic Device;Manual techniques;Neuromuscular re-education;Cryotherapy;Electrical Stimulation;Gait training;Stair training;Patient/family education;Scar mobilization   PT Next Visit Plan Hold therapy per pt request.  If pt returns, will continue progressive ROM/strengthening for RLE.  If pt doesn't return in 2 wks, will d/c to HEP.    Consulted and Agree with Plan of Care Patient      Patient will benefit from skilled therapeutic intervention in order to improve the following deficits and impairments:  Increased edema, Decreased strength, Pain, Decreased range of motion, Difficulty walking, Decreased endurance  Visit Diagnosis: Muscle weakness (generalized)  Stiffness of right knee, not elsewhere classified  Swelling of joint, knee, right     Problem List Patient Active Problem List   Diagnosis Date Noted  . Right knee DJD 06/13/2016  . Complete rotator cuff tear 12/01/2015  . Wheezing 04/07/2014  . Impaired fasting glucose 07/26/2013  . Essential hypertension, benign 07/26/2013  . Back pain 07/26/2013  . Knee pain, bilateral 01/10/2013  . Morbid obesity ([place]) 01/10/2013  . Headache(784.0) 12/06/2012   Kerin Perna, PTA 09/03/16 1:07 PM  [place], [place], [address] Phone: [phone]   Fax:  [phone]  Name: Megan Petersen MRN: [id number] Date of Birth: 1960/12/05  PHYSICAL THERAPY DISCHARGE SUMMARY  Visits from Start of Care: 19  Current functional level related to goals / functional outcomes: See above for last visit   Remaining deficits: See above   Education / Equipment: HEP Plan: Patient agrees to discharge.  Patient goals  were partially met. Patient is being discharged due to not returning since the last visit.  ?????    Jeral Pinch, PT 09/19/16 5:22 PM

## 2017-05-30 IMAGING — CR DG CHEST 2V
2 series · 2 of 2 positions shown · non-contrast
Comparison: 04/29/2013

CLINICAL DATA: Preop rotator cuff repair.  Cough since 11/26/2015.

EXAM:
CHEST  2 VIEW

[w chest pa]
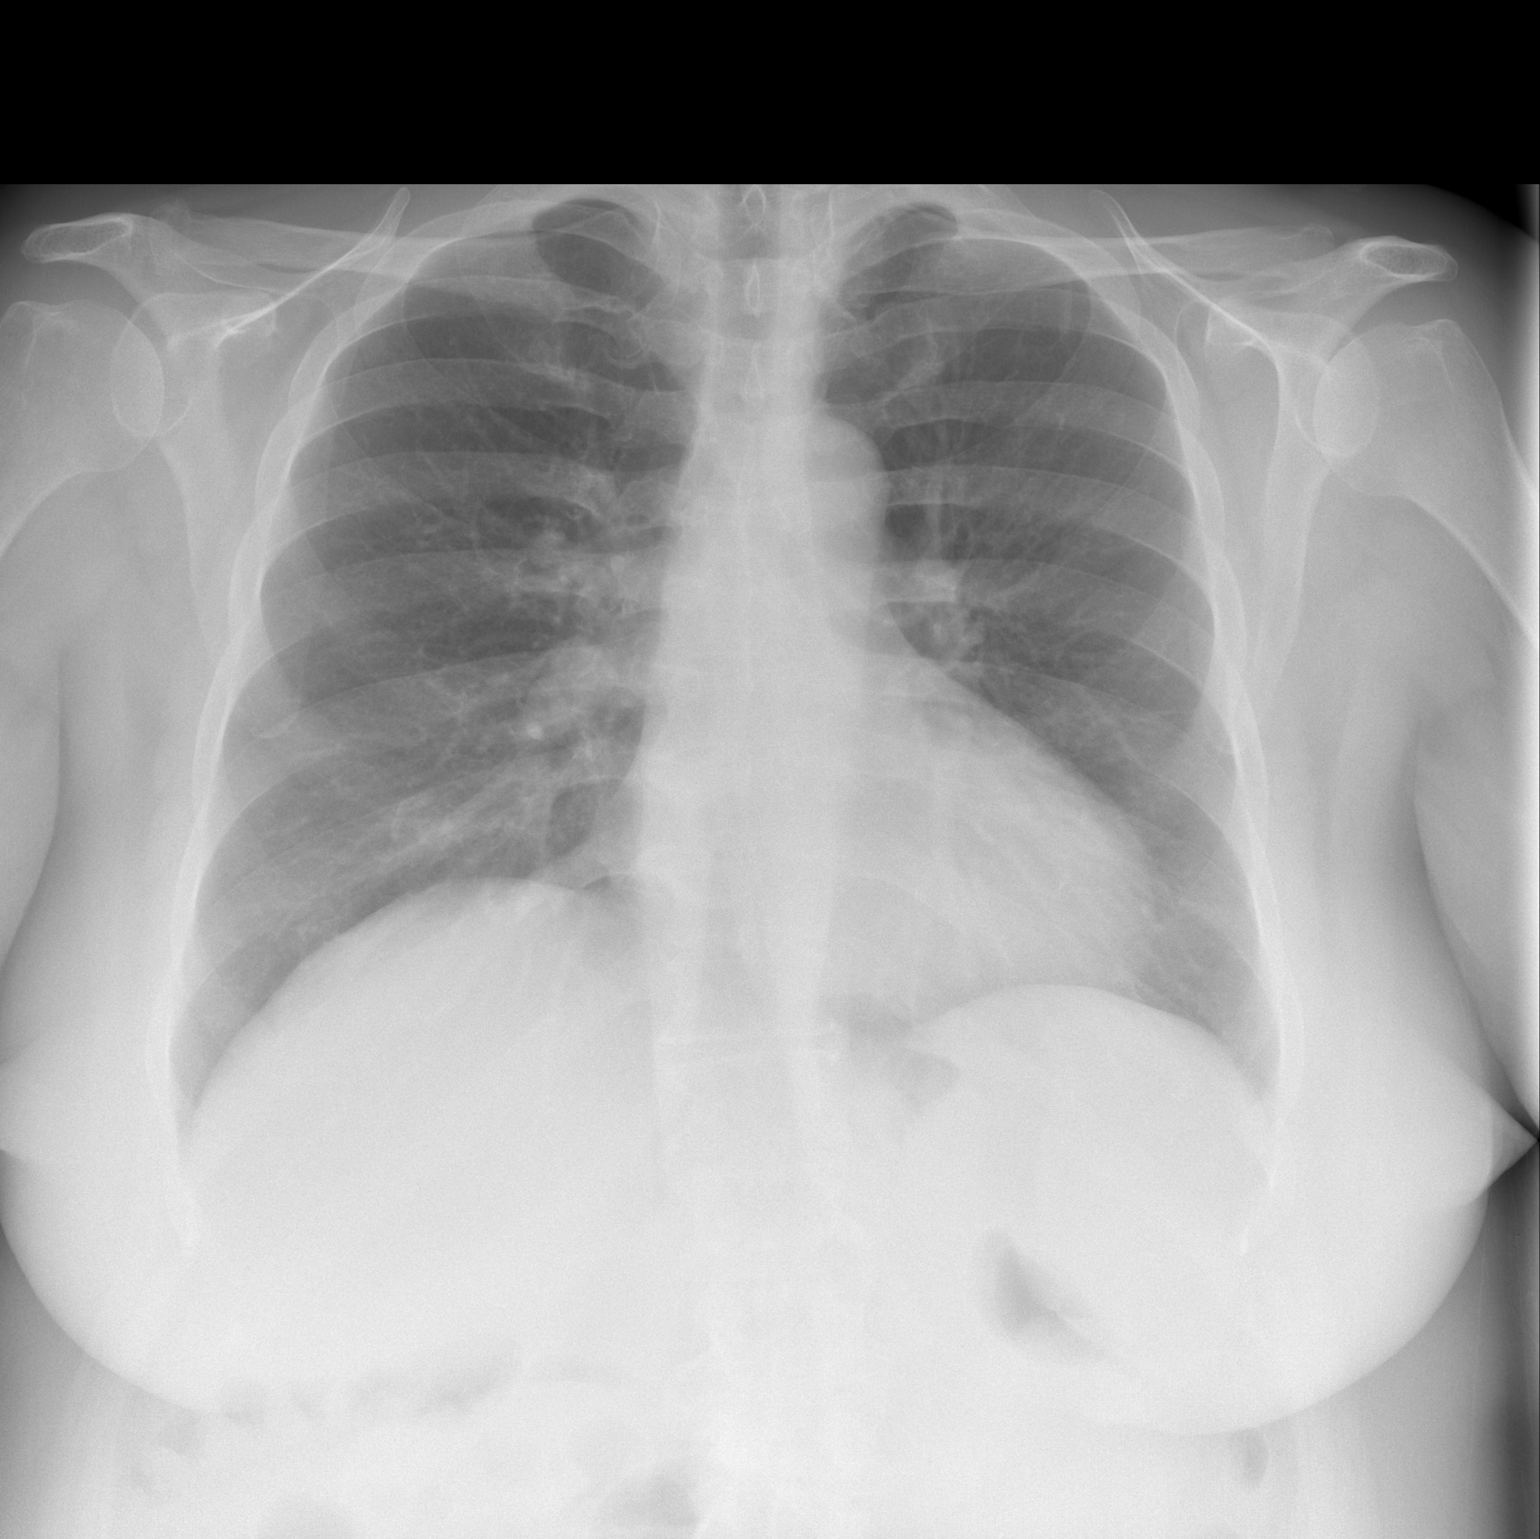

[w chest lat]
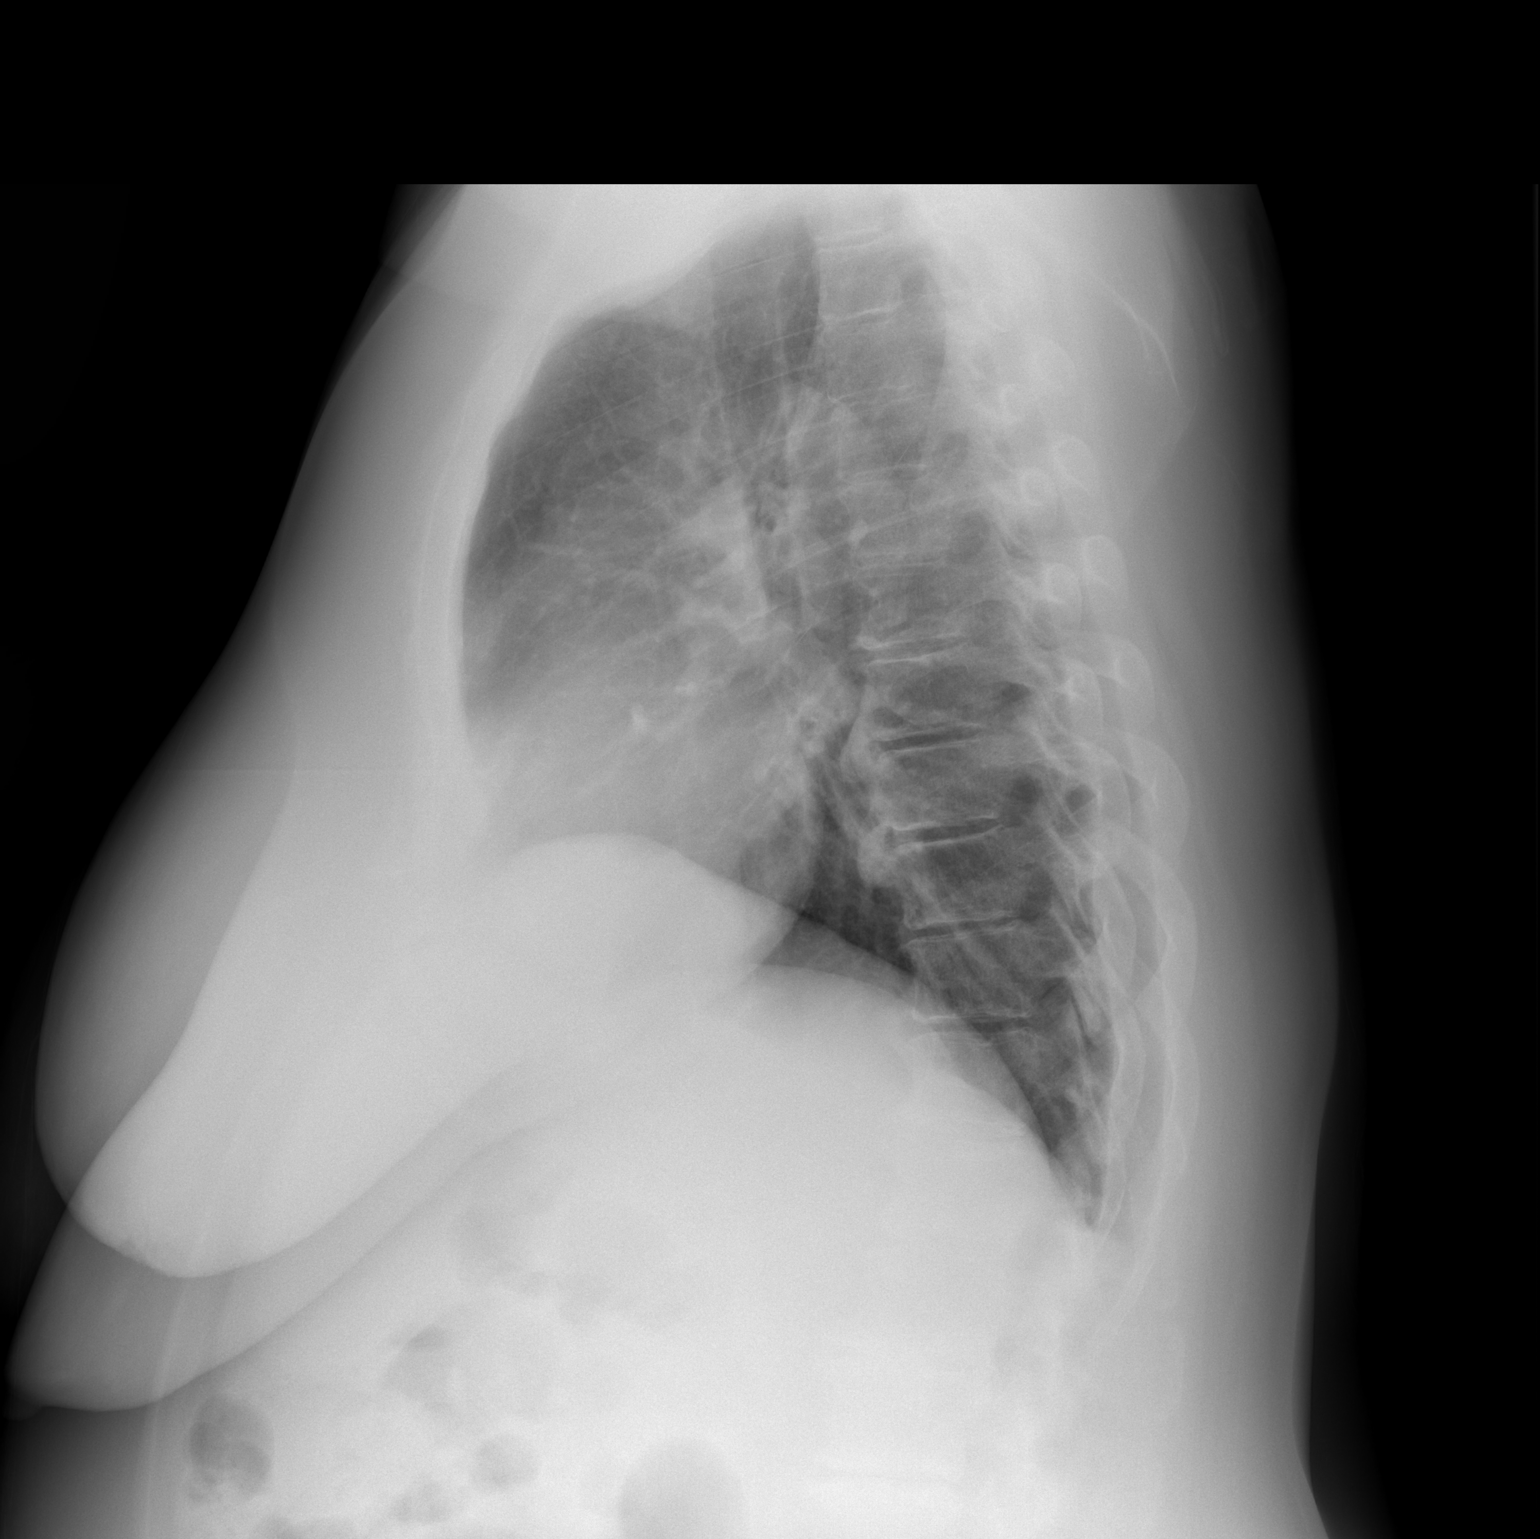

[2 of 2 positions shown; findings below may reference images not displayed]

FINDINGS: Heart and mediastinal contours are within normal limits. No focal
opacities or effusions. No acute bony abnormality. Degenerative
changes in the mid to lower thoracic spine.
IMPRESSION: No active cardiopulmonary disease.

## 2017-06-27 ENCOUNTER — Ambulatory Visit: Payer: Self-pay | Admitting: Orthopedic Surgery

## 2017-07-08 ENCOUNTER — Ambulatory Visit: Payer: Self-pay | Admitting: Orthopedic Surgery

## 2017-07-08 NOTE — H&P (Signed)
Megan Petersen DOB: [birth year] Married / Language: English / Race: White Female  H&P Date: 07/08/17  Chief Complaint: Left knee pain  History of Present Illness  The patient is a 56 year old female who comes in today for a preoperative History and Physical. The patient is scheduled for a left total knee arthroplasty to be performed by Dr. Javier DockerJeffrey C. Beane, MD at [facility] on 07/16/2017. Megan Petersen reports chronic, progressively worsening L knee pain, instability refractory to conservative tx including injections, PT, HEP, activity modifications, medications, relative rest. Her pain is interfering with ADLs and quality of life at this point and she desires to proceed with surgery.  Dr. Shelle Petersen and the patient mutually agreed to proceed with a total knee replacement. Risks and benefits of the procedure were discussed including stiffness, suboptimal range of motion, persistent pain, infection requiring removal of prosthesis and reinsertion, need for prophylactic antibiotics in the future, for example, dental procedures, possible need for manipulation, revision in the future and also anesthetic complications including DVT, PE, etc. We discussed the perioperative course, time in the hospital, postoperative recovery and the need for elevation to control swelling. We also discussed the predicted range of motion and the probability that squatting and kneeling would be unobtainable in the future. In addition, postoperative anticoagulation was discussed. We have obtained preoperative medical clearance as necessary. Provided illustrated handout and discussed it in detail. They will enroll in the total joint replacement educational forum at the hospital.  She reports she just started experiencing some facial pressure yesterday and a sore throat, symptoms consistent with sinus infx for her. Unfortunately she is prone to these. Her PCP typically puts her on a z-pack for this which works well for her. No  fever.  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)  Traumatic rotator cuff tear, right, subsequent encounter (S46.011D)  Sprain/Strain, Lumbar, initial encounter (S33.5XXA)  Patellofemoral arthritis (M17.10)  Impingement syndrome of right shoulder (M75.41)  Asthma  High blood pressure  Impaired Vision  Pneumonia  in past Gastroesophageal Reflux Disease  Gallbladder Problems  Hemorrhoids  Anemia  post-vaginal delivery hemorrhaged requiring 4 units Measles   Allergies  DEMEROL [10/24/2009]: N/V SHELLFISH [10/24/2009]: LYRICA [10/24/2009]: blisters CODEINE [10/24/2009]: hives Betadine *ANTISEPTICS & DISINFECTANTS*  ChloraPrep One Step *ANTISEPTICS & DISINFECTANTS*  rash Erythromycin Ethylsuccinate *MACROLIDES*  Erythromycin *MACROLIDES*  GI issues  Family History Congestive Heart Failure  mother Diabetes Mellitus  sister and brother First Degree Relatives  Cancer  mother and father Heart disease in female family member before age 57  Hypertension  mother, sister and brother Osteoarthritis  sister Siblings  brother, sister and niece with DVTs  Social History Tobacco use  Never smoker. 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 (apply to anterior nostrils BID x 7 days pre-op) Chlorhexidine Gluconate (2% Liquid, External, Taken starting 06/23/2017) Active. Irbesartan (150MG  Tablet, Oral) Active. Ultram (50MG  Tablet, Oral) Active. Singulair (10MG  Tablet, Oral) Active. Furosemide (40MG  Tablet, Oral) Active. Indomethacin CR (75MG  Capsule ER, Oral) Active. Symbicort (160-4.5MCG/ACT Aerosol, Inhalation) Active. Voltaren (1% Gel, Transdermal) Active. Lidoderm (5% Patch, External) Active. (prn) Robaxin (500MG  Tablet, Oral) Active. NexIUM (40MG  Capsule DR, Oral) Active. CloNIDine HCl (0.1MG  Tablet, Oral) Active. Vitamin D (1000UNIT Capsule, Oral) Active. Tart Cherry Advanced (Oral) Active. Turmeric (450MG  Capsule, Oral) Active. Acidophilus (Oral) Active. Colace (100MG  Capsule, Oral) Active. Glucosamine Chondr 1500 Complx (Oral) Specific strength unknown - Active.  Pregnancy / Birth History Pregnant  no  Past Surgical History Gallbladder Surgery  open, 1989 Total Knee Replacement - Right [06/13/2016]: Rotator Cuff Repair - Right  2017 Dr Megan Petersen Arthroscopic Knee Surgery - Right  2013  Review of Systems Megan Petersen; 07/08/2017 8:50 AM) General Not Present- Chills, Fatigue, Fever, Memory Loss, Night Sweats, Weight Gain and Weight Loss. Skin Not Present- Eczema, Hives, Itching, Lesions and Rash. HEENT Present- Dentures, Sinus Pain and Sore Throat. Not Present- Double Vision, Headache,  Hearing Loss, Tinnitus and Visual Loss. Respiratory Present- Allergies (seasonal). Not Present- Chronic Cough, Coughing up blood, Shortness of breath at rest and Shortness of breath with exertion. Cardiovascular Not Present- Chest Pain, Difficulty Breathing Lying Down, Murmur, Palpitations, Racing/skipping heartbeats and Swelling. Gastrointestinal Not Present- Abdominal Pain, Bloody Stool, Constipation, Diarrhea, Difficulty Swallowing, Heartburn, Jaundice, Loss of appetitie, Nausea and Vomiting. Female Genitourinary Not Present- Blood in Urine, Discharge, Flank Pain, Incontinence, Painful Urination, Urgency, Urinary frequency, Urinary Retention, Urinating at Night and Weak urinary stream. Musculoskeletal Present- Joint Pain and Morning Stiffness. Not Present- Back Pain, Joint Swelling, Muscle Pain, Muscle Weakness and Spasms. Neurological Not Present- Blackout spells, Difficulty with balance, Dizziness, Paralysis, Tremor and Weakness. Psychiatric Not Present- Insomnia.  Physical Exam General Mental Status -Alert, cooperative and good historian. General Appearance-pleasant, Not in acute distress. Orientation-Oriented X3. Build & Nutrition-Obese. Gait-Antalgic.  Head and Neck Head-normocephalic, atraumatic . Neck Global Assessment - supple, no bruit auscultated on the right, no bruit auscultated on the left.  Eye Pupil - Bilateral-Regular and Round. Motion - Bilateral-EOMI.  Chest and Lung Exam Auscultation Breath sounds - clear at anterior chest wall and clear at posterior chest wall. Adventitious sounds - No Adventitious sounds.  Cardiovascular Auscultation Rhythm - Regular rate and rhythm. Heart Sounds - S1 WNL and S2 WNL. Murmurs & Other Heart Sounds - Auscultation of the heart reveals - No Murmurs.  Abdomen Palpation/Percussion Tenderness - Abdomen is non-tender to palpation. Rigidity (guarding) - Abdomen is soft. Auscultation Auscultation of the abdomen  reveals - Bowel sounds normal.  Female Genitourinary Not done, not pertinent to present illness  Musculoskeletal L knee tender medial joint line. Slight varus deformity noted. Trace effusion. No calf pain or sign of DVT. ROM approx 0-110 degrees. No pain or laxity with varus or valgus stress.  Imaging xrays with end-stage L knee medial joint space narrowing, bone on bone with varus deformity.  Assessment & Plan Primary osteoarthritis of left  knee (M17.12)  Pt with end-stage L knee DJD, bone-on-bone, refractory to conservative tx, scheduled for L total knee replacement by Dr. Shelle Iron 07/16/17. We again discussed the procedure itself as well as risks, complications and alternatives, including but not limited to DVT, PE, infx, bleeding, failure of procedure, need for secondary procedure including manipulation, nerve injury, ongoing pain/symptoms, anesthesia risk, even stroke or death. Also discussed typical post-op protocols, activity restrictions, need for PT, flexion/extension exercises, time out of work. Discussed need for DVT ppx post-op per protocol. Discussed dental ppx and infx prevention. Also discussed limitations post-operatively such as kneeling and squatting. All questions were answered. Patient desires to proceed with surgery as scheduled. Will hold supplements, ASA and NSAIDs accordingly. Will remain NPO after MN night before surgery. Will utilize Kefzol and Clindamycin for prophylaxis given remote hx of MRSA exposure, mupirocin ointment as she is allergic to CHG cannot use CHG wash. Will present to [facility] for pre-op testing. Anticipate hospital stay to include at least 2 midnights given medical history and to ensure proper pain control. Plan Xarelto for 2 weeks given family hx of DVT for ppx post-op then convert to ASA. Plan Percocet 10mg , Robaxin, Colace, Miralax. Plan home with HHPT post-op with family members at home for assistance then convert to outpt after 2 week follow up which she will  do in [place] as last time. Will follow up 10-14 days post-op for staple removal and xrays. I have instructed her to call her PCP regarding her sinus pressure.  Plan left total knee replacement  Signed electronically by Dorothy Spark, Petersen for Dr. Shelle Iron

## 2017-07-08 NOTE — H&P (View-Only) (Signed)
Megan Petersen DOB: [birth year] Married / Language: English / Race: White Female  H&P Date: 07/08/17  Chief Complaint: Left knee pain  History of Present Illness  The patient is a 56 year old female who comes in today for a preoperative History and Physical. The patient is scheduled for a left total knee arthroplasty to be performed by Dr. Javier DockerJeffrey C. Beane, MD at [facility] on 07/16/2017. Megan LoserRhonda reports chronic, progressively worsening L knee pain, instability refractory to conservative tx including injections, PT, HEP, activity modifications, medications, relative rest. Her pain is interfering with ADLs and quality of life at this point and she desires to proceed with surgery.  Megan Petersen and the patient mutually agreed to proceed with a total knee replacement. Risks and benefits of the procedure were discussed including stiffness, suboptimal range of motion, persistent pain, infection requiring removal of prosthesis and reinsertion, need for prophylactic antibiotics in the future, for example, dental procedures, possible need for manipulation, revision in the future and also anesthetic complications including DVT, PE, etc. We discussed the perioperative course, time in the hospital, postoperative recovery and the need for elevation to control swelling. We also discussed the predicted range of motion and the probability that squatting and kneeling would be unobtainable in the future. In addition, postoperative anticoagulation was discussed. We have obtained preoperative medical clearance as necessary. Provided illustrated handout and discussed it in detail. They will enroll in the total joint replacement educational forum at the hospital.  She reports she just started experiencing some facial pressure yesterday and a sore throat, symptoms consistent with sinus infx for her. Unfortunately she is prone to these. Her PCP typically puts her on a z-pack for this which works well for her. No  fever.  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)  Traumatic rotator cuff tear, right, subsequent encounter (S46.011D)  Sprain/Strain, Lumbar, initial encounter (S33.5XXA)  Patellofemoral arthritis (M17.10)  Impingement syndrome of right shoulder (M75.41)  Asthma  High blood pressure  Impaired Vision  Pneumonia  in past Gastroesophageal Reflux Disease  Gallbladder Problems  Hemorrhoids  Anemia  post-vaginal delivery hemorrhaged requiring 4 units Measles   Allergies  DEMEROL [10/24/2009]: N/V SHELLFISH [10/24/2009]: LYRICA [10/24/2009]: blisters CODEINE [10/24/2009]: hives Betadine *ANTISEPTICS & DISINFECTANTS*  ChloraPrep One Step *ANTISEPTICS & DISINFECTANTS*  rash Erythromycin Ethylsuccinate *MACROLIDES*  Erythromycin *MACROLIDES*  GI issues  Family History Congestive Heart Failure  mother Diabetes Mellitus  sister and brother First Degree Relatives  Cancer  mother and father Heart disease in female family member before age 57  Hypertension  mother, sister and brother Osteoarthritis  sister Siblings  brother, sister and niece with DVTs  Social History Tobacco use  Never smoker. 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 (apply to anterior nostrils BID x 7 days pre-op) Chlorhexidine Gluconate (2% Liquid, External, Taken starting 06/23/2017) Active. Irbesartan (150MG  Tablet, Oral) Active. Ultram (50MG  Tablet, Oral) Active. Singulair (10MG  Tablet, Oral) Active. Furosemide (40MG  Tablet, Oral) Active. Indomethacin CR (75MG  Capsule ER, Oral) Active. Symbicort (160-4.5MCG/ACT Aerosol, Inhalation) Active. Voltaren (1% Gel, Transdermal) Active. Lidoderm (5% Patch, External) Active. (prn) Robaxin (500MG  Tablet, Oral) Active. NexIUM (40MG  Capsule DR, Oral) Active. CloNIDine HCl (0.1MG  Tablet, Oral) Active. Vitamin D (1000UNIT Capsule, Oral) Active. Tart Cherry Advanced (Oral) Active. Turmeric (450MG  Capsule, Oral) Active. Acidophilus (Oral) Active. Colace (100MG  Capsule, Oral) Active. Glucosamine Chondr 1500 Complx (Oral) Specific strength unknown - Active.  Pregnancy / Birth History Pregnant  no  Past Surgical History Gallbladder Surgery  open, 1989 Total Knee Replacement - Right [06/13/2016]: Rotator Cuff Repair - Right  2017 Dr Megan Petersen Arthroscopic Knee Surgery - Right  2013  Review of Systems [name](Megan M. Bissell PA-C; 07/08/2017 8:50 AM) General Not Present- Chills, Fatigue, Fever, Memory Loss, Night Sweats, Weight Gain and Weight Loss. Skin Not Present- Eczema, Hives, Itching, Lesions and Rash. HEENT Present- Dentures, Sinus Pain and Sore Throat. Not Present- Double Vision, Headache,  Hearing Loss, Tinnitus and Visual Loss. Respiratory Present- Allergies (seasonal). Not Present- Chronic Cough, Coughing up blood, Shortness of breath at rest and Shortness of breath with exertion. Cardiovascular Not Present- Chest Pain, Difficulty Breathing Lying Down, Murmur, Palpitations, Racing/skipping heartbeats and Swelling. Gastrointestinal Not Present- Abdominal Pain, Bloody Stool, Constipation, Diarrhea, Difficulty Swallowing, Heartburn, Jaundice, Loss of appetitie, Nausea and Vomiting. Female Genitourinary Not Present- Blood in Urine, Discharge, Flank Pain, Incontinence, Painful Urination, Urgency, Urinary frequency, Urinary Retention, Urinating at Night and Weak urinary stream. Musculoskeletal Present- Joint Pain and Morning Stiffness. Not Present- Back Pain, Joint Swelling, Muscle Pain, Muscle Weakness and Spasms. Neurological Not Present- Blackout spells, Difficulty with balance, Dizziness, Paralysis, Tremor and Weakness. Psychiatric Not Present- Insomnia.  Physical Exam General Mental Status -Alert, cooperative and good historian. General Appearance-pleasant, Not in acute distress. Orientation-Oriented X3. Build & Nutrition-Obese. Gait-Antalgic.  Head and Neck Head-normocephalic, atraumatic . Neck Global Assessment - supple, no bruit auscultated on the right, no bruit auscultated on the left.  Eye Pupil - Bilateral-Regular and Round. Motion - Bilateral-EOMI.  Chest and Lung Exam Auscultation Breath sounds - clear at anterior chest wall and clear at posterior chest wall. Adventitious sounds - No Adventitious sounds.  Cardiovascular Auscultation Rhythm - Regular rate and rhythm. Heart Sounds - S1 WNL and S2 WNL. Murmurs & Other Heart Sounds - Auscultation of the heart reveals - No Murmurs.  Abdomen Palpation/Percussion Tenderness - Abdomen is non-tender to palpation. Rigidity (guarding) - Abdomen is soft. Auscultation Auscultation of the abdomen  reveals - Bowel sounds normal.  Female Genitourinary Not done, not pertinent to present illness  Musculoskeletal L knee tender medial joint line. Slight varus deformity noted. Trace effusion. No calf pain or sign of DVT. ROM approx 0-110 degrees. No pain or laxity with varus or valgus stress.  Imaging xrays with end-stage L knee medial joint space narrowing, bone on bone with varus deformity.  Assessment & Plan Primary osteoarthritis of left  knee (M17.12)  Pt with end-stage L knee DJD, bone-on-bone, refractory to conservative tx, scheduled for L total knee replacement by Dr. Shelle Iron 07/16/17. We again discussed the procedure itself as well as risks, complications and alternatives, including but not limited to DVT, PE, infx, bleeding, failure of procedure, need for secondary procedure including manipulation, nerve injury, ongoing pain/symptoms, anesthesia risk, even stroke or death. Also discussed typical post-op protocols, activity restrictions, need for PT, flexion/extension exercises, time out of work. Discussed need for DVT ppx post-op per protocol. Discussed dental ppx and infx prevention. Also discussed limitations post-operatively such as kneeling and squatting. All questions were answered. Patient desires to proceed with surgery as scheduled. Will hold supplements, ASA and NSAIDs accordingly. Will remain NPO after MN night before surgery. Will utilize Kefzol and Clindamycin for prophylaxis given remote hx of MRSA exposure, mupirocin ointment as she is allergic to CHG cannot use CHG wash. Will present to [facility] for pre-op testing. Anticipate hospital stay to include at least 2 midnights given medical history and to ensure proper pain control. Plan Xarelto for 2 weeks given family hx of DVT for ppx post-op then convert to ASA. Plan Percocet 10mg , Robaxin, Colace, Miralax. Plan home with HHPT post-op with family members at home for assistance then convert to outpt after 2 week follow up which she will  do in [place] as last time. Will follow up 10-14 days post-op for staple removal and xrays. I have instructed her to call her PCP regarding her sinus pressure.  Plan left total knee replacement  Signed electronically by Dorothy Spark, PA-C for Dr. Shelle Iron

## 2017-07-09 ENCOUNTER — Other Ambulatory Visit (HOSPITAL_COMMUNITY): Payer: Self-pay | Admitting: Emergency Medicine

## 2017-07-09 NOTE — Patient Instructions (Addendum)
Megan RatelRhonda R Petersen  07/09/2017   Your procedure is scheduled on: 07-16-17  Report [facility]  Entrance [place]  elevators to 3rd floor to  Short Stay Center at 6AM.   Call this number if you have problems the morning of surgery [phone]   Remember: ONLY 1 PERSON MAY GO WITH YOU TO SHORT STAY TO GET  READY MORNING OF YOUR SURGERY.  Do not eat food or drink liquids :After Midnight.     Take these medicines the morning of surgery with A SIP OF WATER: clonidine(catapres) esomeprazole, inhaler as needed                                You may not have any metal on your body including hair pins and              piercings  Do not wear jewelry, make-up, lotions, powders or perfumes, deodorant             Do not wear nail polish.  Do not shave  48 hours prior to surgery.            Do not bring valuables to the hospital. [place] IS NOT             RESPONSIBLE   FOR VALUABLES.  Contacts, dentures or bridgework may not be worn into surgery.  Leave suitcase in the car. After surgery it may be brought to your room.                Please read over the following fact sheets you were given: _____________________________________________________________________            [facility] - Preparing for [facility] Before surgery, you can play an important role.  Because skin is not sterile, your skin needs to be as free of germs as possible.  You can reduce the number of germs on your skin by washing with CHG (chlorahexidine gluconate) soap before surgery.  CHG is an antiseptic cleaner which kills germs and bonds with the skin to continue killing germs even after washing. Please DO NOT use if you have an allergy to CHG or antibacterial soaps.  If your skin becomes reddened/irritated stop using the CHG and inform your nurse when you arrive at Short Stay. Do not shave (including legs and underarms) for at least 48 hours prior to the first CHG shower.  You may shave your  face/neck. Please follow these instructions carefully:  1.  Shower with CHG Soap the night before surgery and the  morning of Surgery.  2.  If you choose to wash your hair, wash your hair first as usual with your  normal  shampoo.  3.  After you shampoo, rinse your hair and body thoroughly to remove the  shampoo.                           4.  Use CHG as you would any other liquid soap.  You can apply chg directly  to the skin and wash                       Gently with a scrungie or clean washcloth.  5.  Apply the CHG Soap to your body ONLY FROM THE NECK DOWN.  Do not use on face/ open                           Wound or open sores. Avoid contact with eyes, ears mouth and genitals (private parts).                       Wash face,  Genitals (private parts) with your normal soap.             6.  Wash thoroughly, paying special attention to the area where your surgery  will be performed.  7.  Thoroughly rinse your body with warm water from the neck down.  8.  DO NOT shower/wash with your normal soap after using and rinsing off  the CHG Soap.                9.  Pat yourself dry with a clean towel.            10.  Wear clean pajamas.            11.  Place clean sheets on your bed the night of your first shower and do not  sleep with pets. Day of Surgery : Do not apply any lotions/deodorants the morning of surgery.  Please wear clean clothes to the hospital/surgery center.  FAILURE TO FOLLOW THESE INSTRUCTIONS MAY RESULT IN THE CANCELLATION OF YOUR SURGERY PATIENT SIGNATURE_________________________________  NURSE SIGNATURE__________________________________  ________________________________________________________________________   Megan Petersen  An incentive spirometer is a tool that can help keep your lungs clear and active. This tool measures how well you are filling your lungs with each breath. Taking long deep breaths may help reverse or decrease the chance of developing breathing  (pulmonary) problems (especially infection) following:  A long period of time when you are unable to move or be active. BEFORE THE PROCEDURE   If the spirometer includes an indicator to show your best effort, your nurse or respiratory therapist will set it to a desired goal.  If possible, sit up straight or lean slightly forward. Try not to slouch.  Hold the incentive spirometer in an upright position. INSTRUCTIONS FOR USE  1. Sit on the edge of your bed if possible, or sit up as far as you can in bed or on a chair. 2. Hold the incentive spirometer in an upright position. 3. Breathe out normally. 4. Place the mouthpiece in your mouth and seal your lips tightly around it. 5. Breathe in slowly and as deeply as possible, raising the piston or the ball toward the top of the column. 6. Hold your breath for 3-5 seconds or for as long as possible. Allow the piston or ball to fall to the bottom of the column. 7. Remove the mouthpiece from your mouth and breathe out normally. 8. Rest for a few seconds and repeat Steps 1 through 7 at least 10 times every 1-2 hours when you are awake. Take your time and take a few normal breaths between deep breaths. 9. The spirometer may include an indicator to show your best effort. Use the indicator as a goal to work toward during each repetition. 10. After each set of 10 deep breaths, practice coughing to be sure your lungs are clear. If you have an incision (the cut made at the time of surgery), support your incision when coughing by placing a pillow or rolled up towels firmly against it. Once you are able to get out of  bed, walk around indoors and cough well. You may stop using the incentive spirometer when instructed by your caregiver.  RISKS AND COMPLICATIONS  Take your time so you do not get dizzy or light-headed.  If you are in pain, you may need to take or ask for pain medication before doing incentive spirometry. It is harder to take a deep breath if you  are having pain. AFTER USE  Rest and breathe slowly and easily.  It can be helpful to keep track of a log of your progress. Your caregiver can provide you with a simple table to help with this. If you are using the spirometer at home, follow these instructions: [place] IF:   You are having difficultly using the spirometer.  You have trouble using the spirometer as often as instructed.  Your pain medication is not giving enough relief while using the spirometer.  You develop fever of 100.5 F (38.1 C) or higher. SEEK IMMEDIATE MEDICAL CARE IF:   You cough up bloody sputum that had not been present before.  You develop fever of 102 F (38.9 C) or greater.  You develop worsening pain at or near the incision site. MAKE SURE YOU:   Understand these instructions.  Will watch your condition.  Will get help right away if you are not doing well or get worse. Document Released: 12/23/2006 Document Revised: 11/04/2011 Document Reviewed: 02/23/2007 [facility] Patient Information 2014 [place], [place].   ________________________________________________________________________

## 2017-07-10 ENCOUNTER — Other Ambulatory Visit: Payer: Self-pay

## 2017-07-10 ENCOUNTER — Ambulatory Visit: Payer: Self-pay | Admitting: Orthopedic Surgery

## 2017-07-10 ENCOUNTER — Encounter (HOSPITAL_COMMUNITY)
Admission: RE | Admit: 2017-07-10 | Discharge: 2017-07-10 | Disposition: A | Discharge status: Home / Self Care | Payer: BLUE CROSS/BLUE SHIELD | Source: Ambulatory Visit | Attending: Specialist | Admitting: Specialist

## 2017-07-10 ENCOUNTER — Encounter (HOSPITAL_COMMUNITY): Payer: Self-pay

## 2017-07-10 DIAGNOSIS — Z0183 Encounter for blood typing: Secondary | ICD-10-CM | POA: Insufficient documentation

## 2017-07-10 DIAGNOSIS — Z01812 Encounter for preprocedural laboratory examination: Secondary | ICD-10-CM | POA: Insufficient documentation

## 2017-07-10 DIAGNOSIS — R9431 Abnormal electrocardiogram [ECG] [EKG]: Secondary | ICD-10-CM | POA: Insufficient documentation

## 2017-07-10 DIAGNOSIS — Z01818 Encounter for other preprocedural examination: Secondary | ICD-10-CM | POA: Insufficient documentation

## 2017-07-10 DIAGNOSIS — I10 Essential (primary) hypertension: Secondary | ICD-10-CM | POA: Diagnosis not present

## 2017-07-10 DIAGNOSIS — M1712 Unilateral primary osteoarthritis, left knee: Secondary | ICD-10-CM | POA: Insufficient documentation

## 2017-07-10 HISTORY — DX: Nausea with vomiting, unspecified: R11.2

## 2017-07-10 HISTORY — DX: Other specified postprocedural states: Z98.890

## 2017-07-10 HISTORY — DX: Prediabetes: R73.03

## 2017-07-10 LAB — BASIC METABOLIC PANEL
ANION GAP: 5 (ref 5–15)
BUN: 12 mg/dL (ref 6–20)
CO2: 30 mmol/L (ref 22–32)
Calcium: 9.1 mg/dL (ref 8.9–10.3)
Chloride: 103 mmol/L (ref 101–111)
Creatinine, Ser: 0.6 mg/dL (ref 0.44–1.00)
GFR calc Af Amer: >60 mL/min (ref >60)
GLUCOSE: 112 mg/dL — AB (ref 65–99)
POTASSIUM: 4.2 mmol/L (ref 3.5–5.1)
Sodium: 138 mmol/L (ref 135–145)

## 2017-07-10 LAB — SURGICAL PCR SCREEN
MRSA, PCR: NEGATIVE
Staphylococcus aureus: POSITIVE — AB

## 2017-07-10 LAB — CBC
HEMATOCRIT: 40.4 % (ref 36.0–46.0)
HEMOGLOBIN: 13.3 g/dL (ref 12.0–15.0)
MCH: 27.7 pg (ref 26.0–34.0)
MCHC: 32.9 g/dL (ref 30.0–36.0)
MCV: 84 fL (ref 78.0–100.0)
Platelets: 320 10*3/uL (ref 150–400)
RBC: 4.81 MIL/uL (ref 3.87–5.11)
RDW: 13.7 % (ref 11.5–15.5)
WBC: 10.1 10*3/uL (ref 4.0–10.5)

## 2017-07-10 LAB — URINALYSIS, ROUTINE W REFLEX MICROSCOPIC
Bilirubin Urine: NEGATIVE
GLUCOSE, UA: NEGATIVE mg/dL
HGB URINE DIPSTICK: NEGATIVE
Ketones, ur: NEGATIVE mg/dL
LEUKOCYTES UA: NEGATIVE
Nitrite: NEGATIVE
PH: 7 (ref 5.0–8.0)
PROTEIN: NEGATIVE mg/dL
SPECIFIC GRAVITY, URINE: 1.009 (ref 1.005–1.030)

## 2017-07-10 LAB — APTT: APTT: 30 s (ref 24–36)

## 2017-07-10 LAB — PROTIME-INR
INR: 1.05
Prothrombin Time: 13.6 seconds (ref 11.4–15.2)

## 2017-07-10 NOTE — Progress Notes (Signed)
   07/10/17 1024  OBSTRUCTIVE SLEEP APNEA  Have you ever been diagnosed with sleep apnea through a sleep study? No  Do you snore loudly (loud enough to be heard through closed doors)?  1  Do you often feel tired, fatigued, or sleepy during the daytime (such as falling asleep during driving or talking to someone)? 0  Has anyone observed you stop breathing during your sleep? 0  Do you have, or are you being treated for high blood pressure? 1  BMI more than 35 kg/m2? 1  Age > 50 (1-yes) 1  Neck circumference greater than:Female 16 inches or larger, Female 17inches or larger? 1  Female Gender (Yes=1) 0  Obstructive Sleep Apnea Score 5

## 2017-07-10 NOTE — Progress Notes (Signed)
elevated BP at pre-op appt, upon recheck , BP remained elevated, EKG obtained today . Pt declines any acute cardiac symptoms. Does report weight fluctuations and states she takes 1/2 tablet of lasix daily. Patient also reports at LOV with PCP, her BP medication was increased. Sh states she monitors her BP at home. RN encouraged patient to continue monitoring BPs , avoid excess sodium intake, and report consistent elevated BPs to her PCP and to call 911 if she is experiencing any acute symptoms. Patient verbalized understanding.

## 2017-07-10 NOTE — Progress Notes (Signed)
Hgba1c, TSH. Vitamin d, lipid profile, CMP, CBC, on chart dated 06-09-17  (copy provided by patient)

## 2017-07-16 ENCOUNTER — Other Ambulatory Visit: Payer: Self-pay

## 2017-07-16 ENCOUNTER — Encounter (HOSPITAL_COMMUNITY)
Admission: RE | Disposition: A | Discharge status: Home Health | Payer: Self-pay | Source: Home / Self Care | Attending: Specialist

## 2017-07-16 ENCOUNTER — Inpatient Hospital Stay (HOSPITAL_COMMUNITY): Payer: BLUE CROSS/BLUE SHIELD

## 2017-07-16 ENCOUNTER — Inpatient Hospital Stay (HOSPITAL_COMMUNITY): Payer: BLUE CROSS/BLUE SHIELD | Admitting: Registered Nurse

## 2017-07-16 ENCOUNTER — Encounter (HOSPITAL_COMMUNITY): Payer: Self-pay | Admitting: Emergency Medicine

## 2017-07-16 ENCOUNTER — Inpatient Hospital Stay (HOSPITAL_COMMUNITY)
Admission: RE | Admit: 2017-07-16 | Discharge: 2017-07-18 | DRG: 470 | Disposition: A | Discharge status: Home Health | Payer: BLUE CROSS/BLUE SHIELD | Attending: Specialist | Admitting: Specialist

## 2017-07-16 DIAGNOSIS — H547 Unspecified visual loss: Secondary | ICD-10-CM | POA: Diagnosis present

## 2017-07-16 DIAGNOSIS — M21162 Varus deformity, not elsewhere classified, left knee: Secondary | ICD-10-CM | POA: Diagnosis present

## 2017-07-16 DIAGNOSIS — Z96659 Presence of unspecified artificial knee joint: Secondary | ICD-10-CM

## 2017-07-16 DIAGNOSIS — Z91013 Allergy to seafood: Secondary | ICD-10-CM

## 2017-07-16 DIAGNOSIS — I10 Essential (primary) hypertension: Secondary | ICD-10-CM | POA: Diagnosis present

## 2017-07-16 DIAGNOSIS — G8929 Other chronic pain: Secondary | ICD-10-CM | POA: Diagnosis present

## 2017-07-16 DIAGNOSIS — Z96651 Presence of right artificial knee joint: Secondary | ICD-10-CM | POA: Diagnosis present

## 2017-07-16 DIAGNOSIS — Z79891 Long term (current) use of opiate analgesic: Secondary | ICD-10-CM | POA: Diagnosis not present

## 2017-07-16 DIAGNOSIS — Z8614 Personal history of Methicillin resistant Staphylococcus aureus infection: Secondary | ICD-10-CM

## 2017-07-16 DIAGNOSIS — M1712 Unilateral primary osteoarthritis, left knee: Secondary | ICD-10-CM | POA: Diagnosis present

## 2017-07-16 DIAGNOSIS — K219 Gastro-esophageal reflux disease without esophagitis: Secondary | ICD-10-CM | POA: Diagnosis present

## 2017-07-16 DIAGNOSIS — Z8701 Personal history of pneumonia (recurrent): Secondary | ICD-10-CM | POA: Diagnosis not present

## 2017-07-16 DIAGNOSIS — Z881 Allergy status to other antibiotic agents status: Secondary | ICD-10-CM

## 2017-07-16 DIAGNOSIS — Z7951 Long term (current) use of inhaled steroids: Secondary | ICD-10-CM

## 2017-07-16 DIAGNOSIS — Z6841 Body Mass Index (BMI) 40.0 and over, adult: Secondary | ICD-10-CM | POA: Diagnosis not present

## 2017-07-16 DIAGNOSIS — Z883 Allergy status to other anti-infective agents status: Secondary | ICD-10-CM

## 2017-07-16 DIAGNOSIS — M25762 Osteophyte, left knee: Secondary | ICD-10-CM | POA: Diagnosis present

## 2017-07-16 DIAGNOSIS — Z79899 Other long term (current) drug therapy: Secondary | ICD-10-CM

## 2017-07-16 DIAGNOSIS — J45909 Unspecified asthma, uncomplicated: Secondary | ICD-10-CM | POA: Diagnosis present

## 2017-07-16 DIAGNOSIS — Z8249 Family history of ischemic heart disease and other diseases of the circulatory system: Secondary | ICD-10-CM | POA: Diagnosis not present

## 2017-07-16 DIAGNOSIS — Z885 Allergy status to narcotic agent status: Secondary | ICD-10-CM

## 2017-07-16 DIAGNOSIS — M25562 Pain in left knee: Secondary | ICD-10-CM | POA: Diagnosis present

## 2017-07-16 DIAGNOSIS — M19071 Primary osteoarthritis, right ankle and foot: Secondary | ICD-10-CM | POA: Diagnosis present

## 2017-07-16 HISTORY — PX: TOTAL KNEE ARTHROPLASTY: SHX125

## 2017-07-16 LAB — TYPE AND SCREEN
ABO/RH(D): B NEG
Antibody Screen: NEGATIVE

## 2017-07-16 SURGERY — ARTHROPLASTY, KNEE, TOTAL
Anesthesia: General | Site: Knee | Laterality: Left

## 2017-07-16 MED ORDER — KCL IN DEXTROSE-NACL 20-5-0.45 MEQ/L-%-% IV SOLN
INTRAVENOUS | Status: AC
  Administered 2017-07-16: 15:00:00 50 mL/h via INTRAVENOUS
  Filled 2017-07-16 (×2): qty 1000

## 2017-07-16 MED ORDER — CLONIDINE HCL 0.1 MG PO TABS
0.1000 mg | ORAL_TABLET | Freq: Two times a day (BID) | ORAL | Status: DC
  Administered 2017-07-16 – 2017-07-18 (×4): 0.1 mg via ORAL
  Filled 2017-07-16 (×4): qty 1

## 2017-07-16 MED ORDER — RIVAROXABAN 10 MG PO TABS
10.0000 mg | ORAL_TABLET | Freq: Every day | ORAL | Status: DC
Start: 2017-07-17 — End: 2017-07-18
  Administered 2017-07-17 – 2017-07-18 (×2): 10 mg via ORAL
  Filled 2017-07-16 (×2): qty 1

## 2017-07-16 MED ORDER — METOCLOPRAMIDE HCL 5 MG PO TABS: 5.0000 mg | ORAL_TABLET | Freq: Three times a day (TID) | ORAL | Status: DC | PRN

## 2017-07-16 MED ORDER — FUROSEMIDE 20 MG PO TABS
20.0000 mg | ORAL_TABLET | Freq: Every day | ORAL | Status: DC
  Administered 2017-07-16 – 2017-07-18 (×3): 20 mg via ORAL
  Filled 2017-07-16 (×3): qty 1

## 2017-07-16 MED ORDER — SODIUM CHLORIDE 0.9 % IV SOLN
INTRAVENOUS | Status: AC
  Filled 2017-07-16: qty 500000

## 2017-07-16 MED ORDER — LIDOCAINE 2% (20 MG/ML) 5 ML SYRINGE
INTRAMUSCULAR | Status: DC | PRN
  Administered 2017-07-16: 80 mg via INTRAVENOUS

## 2017-07-16 MED ORDER — ONDANSETRON HCL 4 MG/2ML IJ SOLN: 4.0000 mg | Freq: Four times a day (QID) | INTRAMUSCULAR | Status: DC | PRN

## 2017-07-16 MED ORDER — BUPIVACAINE-EPINEPHRINE (PF) 0.25% -1:200000 IJ SOLN
INTRAMUSCULAR | Status: AC
  Filled 2017-07-16: qty 60

## 2017-07-16 MED ORDER — ACETAMINOPHEN 10 MG/ML IV SOLN
INTRAVENOUS | Status: AC
  Filled 2017-07-16: qty 100

## 2017-07-16 MED ORDER — PHENOL 1.4 % MT LIQD: 1.0000 | OROMUCOSAL | Status: DC | PRN

## 2017-07-16 MED ORDER — PROPOFOL 10 MG/ML IV BOLUS
INTRAVENOUS | Status: AC
Start: 2017-07-16 — End: 2017-07-16
  Filled 2017-07-16: qty 40

## 2017-07-16 MED ORDER — SODIUM CHLORIDE 0.9 % IR SOLN
Status: DC | PRN
  Administered 2017-07-16: 1000 mL

## 2017-07-16 MED ORDER — METHOCARBAMOL 500 MG PO TABS
500.0000 mg | ORAL_TABLET | Freq: Four times a day (QID) | ORAL | Status: DC | PRN
  Administered 2017-07-16 – 2017-07-18 (×6): 500 mg via ORAL
  Filled 2017-07-16 (×6): qty 1

## 2017-07-16 MED ORDER — SUGAMMADEX SODIUM 500 MG/5ML IV SOLN
INTRAVENOUS | Status: DC | PRN
  Administered 2017-07-16: 250 mg via INTRAVENOUS

## 2017-07-16 MED ORDER — SODIUM CHLORIDE 0.9 % IR SOLN
Status: DC | PRN
  Administered 2017-07-16: 3000 mL

## 2017-07-16 MED ORDER — ACETAMINOPHEN 650 MG RE SUPP: 650.0000 mg | RECTAL | Status: DC | PRN

## 2017-07-16 MED ORDER — METHOCARBAMOL 1000 MG/10ML IJ SOLN
500.0000 mg | Freq: Four times a day (QID) | INTRAVENOUS | Status: DC | PRN
  Administered 2017-07-16: 500 mg via INTRAVENOUS
  Filled 2017-07-16: qty 550

## 2017-07-16 MED ORDER — BUPIVACAINE-EPINEPHRINE (PF) 0.25% -1:200000 IJ SOLN
INTRAMUSCULAR | Status: AC
  Filled 2017-07-16: qty 30

## 2017-07-16 MED ORDER — HYDROMORPHONE HCL 1 MG/ML IJ SOLN
INTRAMUSCULAR | Status: AC
  Administered 2017-07-16: 0.25 mg via INTRAVENOUS
  Filled 2017-07-16: qty 2

## 2017-07-16 MED ORDER — ONDANSETRON HCL 4 MG/2ML IJ SOLN
INTRAMUSCULAR | Status: DC | PRN
  Administered 2017-07-16: 4 mg via INTRAVENOUS

## 2017-07-16 MED ORDER — EPHEDRINE SULFATE-NACL 50-0.9 MG/10ML-% IV SOSY
PREFILLED_SYRINGE | INTRAVENOUS | Status: DC | PRN
  Administered 2017-07-16 (×3): 5 mg via INTRAVENOUS

## 2017-07-16 MED ORDER — MIDAZOLAM HCL 5 MG/5ML IJ SOLN
INTRAMUSCULAR | Status: DC | PRN
  Administered 2017-07-16: 2 mg via INTRAVENOUS

## 2017-07-16 MED ORDER — OXYCODONE HCL 5 MG PO TABS
5.0000 mg | ORAL_TABLET | ORAL | Status: DC | PRN
  Administered 2017-07-16 (×2): 5 mg via ORAL
  Filled 2017-07-16 (×2): qty 1

## 2017-07-16 MED ORDER — PROPOFOL 500 MG/50ML IV EMUL
INTRAVENOUS | Status: DC | PRN
  Administered 2017-07-16: 40 ug/kg/min via INTRAVENOUS

## 2017-07-16 MED ORDER — DOCUSATE SODIUM 100 MG PO CAPS
100.0000 mg | ORAL_CAPSULE | Freq: Two times a day (BID) | ORAL | Status: DC
  Administered 2017-07-16 – 2017-07-18 (×4): 100 mg via ORAL
  Filled 2017-07-16 (×4): qty 1

## 2017-07-16 MED ORDER — DEXAMETHASONE SODIUM PHOSPHATE 10 MG/ML IJ SOLN
INTRAMUSCULAR | Status: AC
Start: 2017-07-16 — End: 2017-07-16
  Filled 2017-07-16: qty 1

## 2017-07-16 MED ORDER — PANTOPRAZOLE SODIUM 40 MG PO TBEC
80.0000 mg | DELAYED_RELEASE_TABLET | Freq: Every day | ORAL | Status: DC
  Administered 2017-07-17 – 2017-07-18 (×2): 80 mg via ORAL
  Filled 2017-07-16 (×2): qty 2

## 2017-07-16 MED ORDER — METHOCARBAMOL 500 MG PO TABS: 500.0000 mg | ORAL_TABLET | Freq: Four times a day (QID) | ORAL | 1 refills | Status: DC | PRN

## 2017-07-16 MED ORDER — SODIUM CHLORIDE 0.9 % IV SOLN
INTRAVENOUS | Status: DC | PRN
  Administered 2017-07-16: 500 mL

## 2017-07-16 MED ORDER — BISACODYL 5 MG PO TBEC: 5.0000 mg | DELAYED_RELEASE_TABLET | Freq: Every day | ORAL | Status: DC | PRN

## 2017-07-16 MED ORDER — HYDROMORPHONE HCL 1 MG/ML IJ SOLN
0.2500 mg | INTRAMUSCULAR | Status: DC | PRN
  Administered 2017-07-16: 0.25 mg via INTRAVENOUS
  Administered 2017-07-16 (×3): 0.5 mg via INTRAVENOUS

## 2017-07-16 MED ORDER — ALUM & MAG HYDROXIDE-SIMETH 200-200-20 MG/5ML PO SUSP
30.0000 mL | ORAL | Status: DC | PRN
Start: 2017-07-16 — End: 2017-07-18

## 2017-07-16 MED ORDER — TRANEXAMIC ACID 1000 MG/10ML IV SOLN
1000.0000 mg | INTRAVENOUS | Status: AC
  Administered 2017-07-16: 1000 mg via INTRAVENOUS
  Filled 2017-07-16: qty 1100

## 2017-07-16 MED ORDER — RIVAROXABAN 10 MG PO TABS: 10.0000 mg | ORAL_TABLET | Freq: Every day | ORAL | 0 refills | Status: DC

## 2017-07-16 MED ORDER — BUPIVACAINE-EPINEPHRINE 0.25% -1:200000 IJ SOLN
INTRAMUSCULAR | Status: DC | PRN
  Administered 2017-07-16: 30 mL

## 2017-07-16 MED ORDER — OXYCODONE-ACETAMINOPHEN 10-325 MG PO TABS: 1.0000 | ORAL_TABLET | ORAL | 0 refills | Status: DC | PRN

## 2017-07-16 MED ORDER — FENTANYL CITRATE (PF) 100 MCG/2ML IJ SOLN: 100.0000 ug | Freq: Once | INTRAMUSCULAR | Status: DC

## 2017-07-16 MED ORDER — MENTHOL 3 MG MT LOZG: 1.0000 | LOZENGE | OROMUCOSAL | Status: DC | PRN

## 2017-07-16 MED ORDER — IPRATROPIUM-ALBUTEROL 20-100 MCG/ACT IN AERS: 1.0000 | INHALATION_SPRAY | Freq: Four times a day (QID) | RESPIRATORY_TRACT | Status: DC | PRN

## 2017-07-16 MED ORDER — EPHEDRINE 5 MG/ML INJ
INTRAVENOUS | Status: AC
  Filled 2017-07-16: qty 10

## 2017-07-16 MED ORDER — IPRATROPIUM-ALBUTEROL 0.5-2.5 (3) MG/3ML IN SOLN: 3.0000 mL | Freq: Four times a day (QID) | RESPIRATORY_TRACT | Status: DC | PRN

## 2017-07-16 MED ORDER — PROMETHAZINE HCL 25 MG/ML IJ SOLN
6.2500 mg | INTRAMUSCULAR | Status: DC | PRN
  Administered 2017-07-16: 12.5 mg via INTRAVENOUS

## 2017-07-16 MED ORDER — OXYCODONE HCL 5 MG PO TABS
10.0000 mg | ORAL_TABLET | ORAL | Status: DC | PRN
  Administered 2017-07-16 – 2017-07-18 (×10): 10 mg via ORAL
  Filled 2017-07-16 (×10): qty 2

## 2017-07-16 MED ORDER — TRAMADOL HCL 50 MG PO TABS: 50.0000 mg | ORAL_TABLET | Freq: Four times a day (QID) | ORAL | Status: DC | PRN

## 2017-07-16 MED ORDER — PROPOFOL 10 MG/ML IV BOLUS
INTRAVENOUS | Status: DC | PRN
  Administered 2017-07-16: 20 mg via INTRAVENOUS

## 2017-07-16 MED ORDER — CLINDAMYCIN PHOSPHATE 900 MG/50ML IV SOLN
INTRAVENOUS | Status: AC
  Filled 2017-07-16: qty 50

## 2017-07-16 MED ORDER — DEXAMETHASONE SODIUM PHOSPHATE 10 MG/ML IJ SOLN
INTRAMUSCULAR | Status: DC | PRN
  Administered 2017-07-16: 10 mg via INTRAVENOUS

## 2017-07-16 MED ORDER — FENTANYL CITRATE (PF) 100 MCG/2ML IJ SOLN
INTRAMUSCULAR | Status: DC | PRN
  Administered 2017-07-16: 100 ug via INTRAVENOUS
  Administered 2017-07-16 (×2): 50 ug via INTRAVENOUS

## 2017-07-16 MED ORDER — HYDROMORPHONE HCL 2 MG/ML IJ SOLN
INTRAMUSCULAR | Status: AC
  Filled 2017-07-16: qty 1

## 2017-07-16 MED ORDER — MONTELUKAST SODIUM 10 MG PO TABS
10.0000 mg | ORAL_TABLET | Freq: Every day | ORAL | Status: DC
  Administered 2017-07-16 – 2017-07-17 (×2): 10 mg via ORAL
  Filled 2017-07-16 (×2): qty 1

## 2017-07-16 MED ORDER — MAGNESIUM CITRATE PO SOLN: 1.0000 | Freq: Once | ORAL | Status: DC | PRN

## 2017-07-16 MED ORDER — METOCLOPRAMIDE HCL 5 MG/ML IJ SOLN: 5.0000 mg | Freq: Three times a day (TID) | INTRAMUSCULAR | Status: DC | PRN

## 2017-07-16 MED ORDER — MIDAZOLAM HCL 2 MG/2ML IJ SOLN
INTRAMUSCULAR | Status: AC
  Filled 2017-07-16: qty 2

## 2017-07-16 MED ORDER — HYDROMORPHONE HCL 1 MG/ML IJ SOLN
INTRAMUSCULAR | Status: DC | PRN
  Administered 2017-07-16 (×4): 0.5 mg via INTRAVENOUS

## 2017-07-16 MED ORDER — LACTATED RINGERS IV SOLN
INTRAVENOUS | Status: DC
  Administered 2017-07-16: 08:00:00 via INTRAVENOUS

## 2017-07-16 MED ORDER — CLINDAMYCIN PHOSPHATE 900 MG/50ML IV SOLN
900.0000 mg | INTRAVENOUS | Status: AC
  Administered 2017-07-16: 900 mg via INTRAVENOUS

## 2017-07-16 MED ORDER — ACETAMINOPHEN 10 MG/ML IV SOLN
1000.0000 mg | INTRAVENOUS | Status: AC
  Administered 2017-07-16: 1000 mg via INTRAVENOUS

## 2017-07-16 MED ORDER — ONDANSETRON HCL 4 MG PO TABS
4.0000 mg | ORAL_TABLET | Freq: Four times a day (QID) | ORAL | Status: DC | PRN
  Administered 2017-07-17: 4 mg via ORAL
  Filled 2017-07-16: qty 1

## 2017-07-16 MED ORDER — HYDROMORPHONE HCL 1 MG/ML IJ SOLN
1.0000 mg | INTRAMUSCULAR | Status: DC | PRN
  Administered 2017-07-16 (×2): 1 mg via INTRAVENOUS
  Administered 2017-07-16: 0.25 mg via INTRAVENOUS
  Administered 2017-07-17 (×2): 1 mg via INTRAVENOUS
  Filled 2017-07-16 (×4): qty 1

## 2017-07-16 MED ORDER — STERILE WATER FOR IRRIGATION IR SOLN
Status: DC | PRN
  Administered 2017-07-16: 2000 mL

## 2017-07-16 MED ORDER — DIPHENHYDRAMINE HCL 12.5 MG/5ML PO ELIX: 12.5000 mg | ORAL_SOLUTION | ORAL | Status: DC | PRN

## 2017-07-16 MED ORDER — POLYETHYLENE GLYCOL 3350 17 G PO PACK: 17.0000 g | PACK | Freq: Every day | ORAL | Status: DC | PRN

## 2017-07-16 MED ORDER — ROCURONIUM BROMIDE 10 MG/ML (PF) SYRINGE
PREFILLED_SYRINGE | INTRAVENOUS | Status: DC | PRN
  Administered 2017-07-16: 80 mg via INTRAVENOUS
  Administered 2017-07-16: 10 mg via INTRAVENOUS

## 2017-07-16 MED ORDER — CEFAZOLIN SODIUM-DEXTROSE 2-4 GM/100ML-% IV SOLN
2.0000 g | Freq: Four times a day (QID) | INTRAVENOUS | Status: AC
  Administered 2017-07-16 – 2017-07-17 (×3): 2 g via INTRAVENOUS
  Filled 2017-07-16 (×3): qty 100

## 2017-07-16 MED ORDER — MOMETASONE FURO-FORMOTEROL FUM 200-5 MCG/ACT IN AERO
2.0000 | INHALATION_SPRAY | Freq: Two times a day (BID) | RESPIRATORY_TRACT | Status: DC
  Administered 2017-07-16 – 2017-07-18 (×4): 2 via RESPIRATORY_TRACT
  Filled 2017-07-16: qty 8.8

## 2017-07-16 MED ORDER — FENTANYL CITRATE (PF) 100 MCG/2ML IJ SOLN
INTRAMUSCULAR | Status: AC
  Filled 2017-07-16: qty 2

## 2017-07-16 MED ORDER — CEFAZOLIN SODIUM 10 G IJ SOLR
3.0000 g | INTRAMUSCULAR | Status: AC
  Administered 2017-07-16: 3 g via INTRAVENOUS
  Filled 2017-07-16: qty 3

## 2017-07-16 MED ORDER — ONDANSETRON HCL 4 MG/2ML IJ SOLN
INTRAMUSCULAR | Status: AC
  Filled 2017-07-16: qty 2

## 2017-07-16 MED ORDER — LIDOCAINE 2% (20 MG/ML) 5 ML SYRINGE
INTRAMUSCULAR | Status: AC
  Filled 2017-07-16: qty 5

## 2017-07-16 MED ORDER — PROMETHAZINE HCL 25 MG/ML IJ SOLN
INTRAMUSCULAR | Status: AC
  Filled 2017-07-16: qty 1

## 2017-07-16 MED ORDER — POLYETHYLENE GLYCOL 3350 17 G PO PACK: 17.0000 g | PACK | Freq: Every day | ORAL | 0 refills | Status: DC

## 2017-07-16 MED ORDER — IRBESARTAN 150 MG PO TABS
300.0000 mg | ORAL_TABLET | Freq: Every day | ORAL | Status: DC
  Administered 2017-07-17 – 2017-07-18 (×2): 300 mg via ORAL
  Filled 2017-07-16 (×2): qty 2

## 2017-07-16 MED ORDER — RISAQUAD PO CAPS
1.0000 | ORAL_CAPSULE | Freq: Every day | ORAL | Status: DC
  Administered 2017-07-17 – 2017-07-18 (×2): 1 via ORAL
  Filled 2017-07-16 (×2): qty 1

## 2017-07-16 MED ORDER — ACETAMINOPHEN 325 MG PO TABS
650.0000 mg | ORAL_TABLET | ORAL | Status: DC | PRN
  Administered 2017-07-17: 650 mg via ORAL
  Filled 2017-07-16: qty 2

## 2017-07-16 SURGICAL SUPPLY — 65 items
BAG ZIPLOCK 12X15 (MISCELLANEOUS) IMPLANT
BANDAGE ACE 4X5 VEL STRL LF (GAUZE/BANDAGES/DRESSINGS) ×3 IMPLANT
BANDAGE ACE 6X5 VEL STRL LF (GAUZE/BANDAGES/DRESSINGS) ×3 IMPLANT
BLADE SAG 18X100X1.27 (BLADE) ×3 IMPLANT
BLADE SAW SGTL 11.0X1.19X90.0M (BLADE) ×3 IMPLANT
BLADE SAW SGTL 13.0X1.19X90.0M (BLADE) ×6 IMPLANT
CAPT KNEE TOTAL 3 ATTUNE ×3 IMPLANT
CEMENT HV SMART SET (Cement) ×6 IMPLANT
CLOSURE WOUND 1/2 X4 (GAUZE/BANDAGES/DRESSINGS)
CLOTH 2% CHLOROHEXIDINE 3PK (PERSONAL CARE ITEMS) ×3 IMPLANT
COVER SURGICAL LIGHT HANDLE (MISCELLANEOUS) ×3 IMPLANT
CUFF TOURN SGL QUICK 34 (TOURNIQUET CUFF)
CUFF TOURN SGL QUICK 44 (TOURNIQUET CUFF) ×3 IMPLANT
CUFF TRNQT CYL 34X4X40X1 (TOURNIQUET CUFF) IMPLANT
DECANTER SPIKE VIAL GLASS SM (MISCELLANEOUS) ×3 IMPLANT
DRAPE INCISE 23X17 IOBAN STRL (DRAPES) ×8
DRAPE INCISE IOBAN 23X17 STRL (DRAPES) ×4 IMPLANT
DRAPE INCISE IOBAN 66X45 STRL (DRAPES) IMPLANT
DRAPE ORTHO SPLIT 77X108 STRL (DRAPES) ×4
DRAPE SHEET LG 3/4 BI-LAMINATE (DRAPES) ×3 IMPLANT
DRAPE SURG ORHT 6 SPLT 77X108 (DRAPES) ×2 IMPLANT
DRAPE U-SHAPE 47X51 STRL (DRAPES) ×3 IMPLANT
DRSG AQUACEL AG ADV 3.5X10 (GAUZE/BANDAGES/DRESSINGS) ×3 IMPLANT
DRSG TEGADERM 4X4.75 (GAUZE/BANDAGES/DRESSINGS) IMPLANT
DURAPREP 26ML APPLICATOR (WOUND CARE) ×3 IMPLANT
ELECT REM PT RETURN 15FT ADLT (MISCELLANEOUS) ×3 IMPLANT
EVACUATOR 1/8 PVC DRAIN (DRAIN) IMPLANT
GAUZE SPONGE 2X2 8PLY STRL LF (GAUZE/BANDAGES/DRESSINGS) IMPLANT
GLOVE BIOGEL PI IND STRL 7.0 (GLOVE) ×1 IMPLANT
GLOVE BIOGEL PI IND STRL 8 (GLOVE) ×1 IMPLANT
GLOVE BIOGEL PI INDICATOR 7.0 (GLOVE) ×2
GLOVE BIOGEL PI INDICATOR 8 (GLOVE) ×2
GLOVE SURG SS PI 7.0 STRL IVOR (GLOVE) ×6 IMPLANT
GLOVE SURG SS PI 7.5 STRL IVOR (GLOVE) ×3 IMPLANT
GLOVE SURG SS PI 8.0 STRL IVOR (GLOVE) ×12 IMPLANT
GOWN STRL REUS W/TWL XL LVL3 (GOWN DISPOSABLE) ×6 IMPLANT
HANDPIECE INTERPULSE COAX TIP (DISPOSABLE) ×2
HEMOSTAT SPONGE AVITENE ULTRA (HEMOSTASIS) ×3 IMPLANT
IMMOBILIZER KNEE 20 (SOFTGOODS) ×3
IMMOBILIZER KNEE 20 THIGH 36 (SOFTGOODS) ×1 IMPLANT
MANIFOLD NEPTUNE II (INSTRUMENTS) ×3 IMPLANT
NS IRRIG 1000ML POUR BTL (IV SOLUTION) IMPLANT
PACK TOTAL KNEE CUSTOM (KITS) ×3 IMPLANT
POSITIONER SURGICAL ARM (MISCELLANEOUS) ×3 IMPLANT
SEALER BIPOLAR AQUA 6.0 (INSTRUMENTS) ×3 IMPLANT
SET HNDPC FAN SPRY TIP SCT (DISPOSABLE) ×1 IMPLANT
SPONGE GAUZE 2X2 STER 10/PKG (GAUZE/BANDAGES/DRESSINGS)
SPONGE SURGIFOAM ABS GEL 100 (HEMOSTASIS) IMPLANT
STAPLER VISISTAT (STAPLE) ×3 IMPLANT
STRIP CLOSURE SKIN 1/2X4 (GAUZE/BANDAGES/DRESSINGS) IMPLANT
SUT BONE WAX W31G (SUTURE) IMPLANT
SUT MNCRL AB 4-0 PS2 18 (SUTURE) IMPLANT
SUT STRATAFIX 0 PDS 27 VIOLET (SUTURE) ×3
SUT VIC AB 1 CT1 27 (SUTURE) ×6
SUT VIC AB 1 CT1 27XBRD ANTBC (SUTURE) ×3 IMPLANT
SUT VIC AB 2-0 CT1 27 (SUTURE) ×8
SUT VIC AB 2-0 CT1 TAPERPNT 27 (SUTURE) ×4 IMPLANT
SUTURE STRATFX 0 PDS 27 VIOLET (SUTURE) ×1 IMPLANT
SYR 50ML LL SCALE MARK (SYRINGE) IMPLANT
TOWER CARTRIDGE SMART MIX (DISPOSABLE) ×3 IMPLANT
TRAY FOLEY CATH SILVER 14FR (SET/KITS/TRAYS/PACK) ×3 IMPLANT
TRAY FOLEY W/METER SILVER 16FR (SET/KITS/TRAYS/PACK) IMPLANT
WATER STERILE IRR 1000ML POUR (IV SOLUTION) ×3 IMPLANT
WRAP KNEE MAXI GEL POST OP (GAUZE/BANDAGES/DRESSINGS) ×3 IMPLANT
YANKAUER SUCT BULB TIP 10FT TU (MISCELLANEOUS) ×3 IMPLANT

## 2017-07-16 NOTE — Anesthesia Postprocedure Evaluation (Signed)
Anesthesia Post Note  Patient: Megan Petersen  Procedure(s) Performed: LEFT TOTAL KNEE ARTHROPLASTY (Left Knee)     Patient location during evaluation: PACU Anesthesia Type: General Level of consciousness: awake and alert Pain management: pain level controlled Vital Signs Assessment: post-procedure vital signs reviewed and stable Respiratory status: spontaneous breathing, nonlabored ventilation, respiratory function stable and patient connected to nasal cannula oxygen Cardiovascular status: blood pressure returned to baseline and stable Postop Assessment: no apparent nausea or vomiting Anesthetic complications: no    Last Vitals:  Vitals:   07/16/17 1300 07/16/17 1310  BP: (!) 175/86   Pulse: 91 91  Resp: (!) 9 16  Temp:    SpO2: 97% 97%    Last Pain:  Vitals:   07/16/17 1300  TempSrc:   PainSc: 8                  Treshon Stannard S

## 2017-07-16 NOTE — Transfer of Care (Signed)
  Vitals:   07/16/17 0613  BP: (!) 184/83  Pulse: 79  Resp: 18  Temp: 36.6 C  SpO2: 98%    Last Pain:  Vitals:   07/16/17 16100613  TempSrc: Oral      Patients Stated Pain Goal: 4 (07/16/17 0636)  Immediate Anesthesia Transfer of Care Note  Patient: Megan Petersen  Procedure(s) Performed: Procedure(s) (LRB): LEFT TOTAL KNEE ARTHROPLASTY (Left)  Patient Location: PACU  Anesthesia Type: General  Level of Consciousness: awake, alert  and oriented  Airway & Oxygen Therapy: Patient Spontanous Breathing and Patient connected to face mask oxygen  Post-op Assessment: Report given to PACU RN and Post -op Vital signs reviewed and stable  Post vital signs: Reviewed and stable  Complications: No apparent anesthesia complications

## 2017-07-16 NOTE — Brief Op Note (Signed)
07/16/2017  11:18 AM  PATIENT:  Rinaldo Ratelhonda R Abdelaziz  56 y.o. female  PRE-OPERATIVE DIAGNOSIS:  Left knee degeneratvie joint disease  POST-OPERATIVE DIAGNOSIS:  Left knee degeneratvie joint disease  PROCEDURE:  Procedure(s) with comments: LEFT TOTAL KNEE ARTHROPLASTY (Left) - 2.5 hrs  SURGEON:  Surgeon(s) and Role:    Jene Every* Shandon Burlingame, MD - Primary  PHYSICIAN ASSISTANT:   ASSISTANTS: [place]   ANESTHESIA:   general  EBL:  275 mL   BLOOD ADMINISTERED:none  DRAINS: none   LOCAL MEDICATIONS USED:  MARCAINE     SPECIMEN:  No Specimen  DISPOSITION OF SPECIMEN:  N/A  COUNTS:  YES  TOURNIQUET:   Total Tourniquet Time Documented: Thigh (Left) - 82 minutes Total: Thigh (Left) - 82 minutes   DICTATION: .Other Dictation: Dictation Number [id number]  PLAN OF CARE: Admit to inpatient   PATIENT DISPOSITION:  PACU - hemodynamically stable.   Delay start of Pharmacological VTE agent (>24hrs) due to surgical blood loss or risk of bleeding: no

## 2017-07-16 NOTE — Anesthesia Preprocedure Evaluation (Signed)
Anesthesia Evaluation  Patient identified by MRN, date of birth, ID band Patient awake    Reviewed: Allergy & Precautions, NPO status , Patient's Chart, lab work & pertinent test results  History of Anesthesia Complications (+) PONV  Airway Mallampati: II  TM Distance: >3 FB Neck ROM: Full    Dental no notable dental hx.    Pulmonary neg pulmonary ROS,    Pulmonary exam normal breath sounds clear to auscultation       Cardiovascular hypertension, Normal cardiovascular exam Rhythm:Regular Rate:Normal     Neuro/Psych negative neurological ROS  negative psych ROS   GI/Hepatic negative GI ROS, Neg liver ROS,   Endo/Other  Morbid obesity  Renal/GU negative Renal ROS  negative genitourinary   Musculoskeletal negative musculoskeletal ROS (+)   Abdominal   Peds negative pediatric ROS (+)  Hematology negative hematology ROS (+)   Anesthesia Other Findings   Reproductive/Obstetrics negative OB ROS                             Anesthesia Physical Anesthesia Plan  ASA: III  Anesthesia Plan: Spinal   Post-op Pain Management:    Induction: Intravenous  PONV Risk Score and Plan: 3 and Treatment may vary due to age or medical condition, Ondansetron and Dexamethasone  Airway Management Planned: Simple Face Mask  Additional Equipment:   Intra-op Plan:   Post-operative Plan:   Informed Consent: I have reviewed the patients History and Physical, chart, labs and discussed the procedure including the risks, benefits and alternatives for the proposed anesthesia with the patient or authorized representative who has indicated his/her understanding and acceptance.   Dental advisory given  Plan Discussed with: CRNA and Surgeon  Anesthesia Plan Comments:         Anesthesia Quick Evaluation

## 2017-07-16 NOTE — Anesthesia Procedure Notes (Signed)
Procedure Name: Intubation Date/Time: 07/16/2017 8:55 AM Performed by: Victoriano Lain, CRNA Pre-anesthesia Checklist: Patient identified, Emergency Drugs available, Suction available, Patient being monitored and Timeout performed Patient Re-evaluated:Patient Re-evaluated prior to induction Oxygen Delivery Method: Circle system utilized Preoxygenation: Pre-oxygenation with 100% oxygen Induction Type: IV induction Ventilation: Mask ventilation without difficulty Laryngoscope Size: Mac and 4 Grade View: Grade I Tube type: Oral Tube size: 7.5 mm Number of attempts: 1 Airway Equipment and Method: Stylet Placement Confirmation: ETT inserted through vocal cords under direct vision,  positive ETCO2 and breath sounds checked- equal and bilateral Secured at: 21 cm Tube secured with: Tape Dental Injury: Teeth and Oropharynx as per pre-operative assessment

## 2017-07-16 NOTE — Interval H&P Note (Signed)
History and Physical Interval Note:  07/16/2017 8:26 AM  Megan Petersen  has presented today for surgery, with the diagnosis of Left knee degeneratvie joint disease  The various methods of treatment have been discussed with the patient and family. After consideration of risks, benefits and other options for treatment, the patient has consented to  Procedure(s) with comments: LEFT TOTAL KNEE ARTHROPLASTY (Left) - 2.5 hrs as a surgical intervention .  The patient's history has been reviewed, patient examined, no change in status, stable for surgery.  I have reviewed the patient's chart and labs.  Questions were answered to the patient's satisfaction.     Benson Porcaro C

## 2017-07-16 NOTE — H&P (Signed)
Consult-SNF placement   Per physician note Post-Surgical Plans is home with HHPT then outpt PT in [place]  CSW will follow and assist with discharge if SNF is recommended.   Vivi BarrackNicole Eddi Hymes, Theresia MajorsLCSWA, MSW Clinical Social Worker  [phone] 07/16/2017  2:55 PM

## 2017-07-16 NOTE — Interval H&P Note (Signed)
History and Physical Interval Note:  07/16/2017 8:26 AM  Megan Petersen  has presented today for surgery, with the diagnosis of Left knee degeneratvie joint disease  The various methods of treatment have been discussed with the patient and family. After consideration of risks, benefits and other options for treatment, the patient has consented to  Procedure(s) with comments: LEFT TOTAL KNEE ARTHROPLASTY (Left) - 2.5 hrs as a surgical intervention .  The patient's history has been reviewed, patient examined, no change in status, stable for surgery.  I have reviewed the patient's chart and labs.  Questions were answered to the patient's satisfaction.     Nyle Limb C   

## 2017-07-17 LAB — BASIC METABOLIC PANEL
ANION GAP: 8 (ref 5–15)
BUN: 12 mg/dL (ref 6–20)
CHLORIDE: 101 mmol/L (ref 101–111)
CO2: 26 mmol/L (ref 22–32)
Calcium: 8.5 mg/dL — ABNORMAL LOW (ref 8.9–10.3)
Creatinine, Ser: 0.55 mg/dL (ref 0.44–1.00)
GFR calc Af Amer: >60 mL/min (ref >60)
GFR calc non Af Amer: >60 mL/min (ref >60)
Glucose, Bld: 142 mg/dL — ABNORMAL HIGH (ref 65–99)
POTASSIUM: 4.5 mmol/L (ref 3.5–5.1)
SODIUM: 135 mmol/L (ref 135–145)

## 2017-07-17 LAB — CBC
HCT: 33.9 % — ABNORMAL LOW (ref 36.0–46.0)
HEMOGLOBIN: 11.2 g/dL — AB (ref 12.0–15.0)
MCH: 27.7 pg (ref 26.0–34.0)
MCHC: 33 g/dL (ref 30.0–36.0)
MCV: 83.9 fL (ref 78.0–100.0)
Platelets: 309 10*3/uL (ref 150–400)
RBC: 4.04 MIL/uL (ref 3.87–5.11)
RDW: 14 % (ref 11.5–15.5)
WBC: 17.1 10*3/uL — AB (ref 4.0–10.5)

## 2017-07-17 MED ORDER — TRAMADOL HCL 50 MG PO TABS: 50.0000 mg | ORAL_TABLET | Freq: Four times a day (QID) | ORAL | Status: DC | PRN

## 2017-07-17 NOTE — Progress Notes (Signed)
Subjective: 1 Day Post-Op Procedure(s) (LRB): LEFT TOTAL KNEE ARTHROPLASTY (Left)  Patient reports pain as mild to moderate.  Tolerating POs well.  Admits to flatus.  Denies fever, chills, N/V, SOB, CP.  Objective:   VITALS:  Temp:  [97.7 F (36.5 C)-99.4 F (37.4 C)] 99.3 F (37.4 C) (11/22 0629) Pulse Rate:  [87-103] 87 (11/22 0629) Resp:  [9-17] 17 (11/22 0629) BP: (140-186)/(65-94) 140/65 (11/22 0629) SpO2:  [90 %-100 %] 98 % (11/22 0629) Weight:  [122.9 kg (271 lb)] 122.9 kg (271 lb) (11/21 1330)  General: WDWN patient in NAD. Psych:  Appropriate mood and affect. Neuro:  A&O x 3, Moving all extremities, sensation intact to light touch HEENT:  EOMs intact Chest:  Even non-labored respirations Skin:  Dressing C/D/I, no rashes or lesions Extremities: warm/dry, mild edema, no erythema or echymosis.  No lymphadenopathy. Pulses: Femoral 2+ MSK:  ROM: lacks 5 degrees of TKE, MMT: able to perform quad set, (-) Homan's    LABS Recent Labs    07/17/17 0616  HGB 11.2*  WBC 17.1*  PLT 309   Recent Labs    07/17/17 0616  NA 135  K 4.5  CL 101  CO2 26  BUN 12  CREATININE 0.55  GLUCOSE 142*   No results for input(s): LABPT, INR in the last 72 hours.   Assessment/Plan: 1 Day Post-Op Procedure(s) (LRB): LEFT TOTAL KNEE ARTHROPLASTY (Left)  Patient seen in rounds for Dr. Gilman ButtnerBeane WBAT L LE Up with therapy Xarelto for DVT prophylaxis due to family hx of clotting D/C home with HHPT when ready, likely tomorrow. Plan for outpatient post-op visit with Dr. Nolen MuBeane  Holland Nickson, PA-C, [facility] Office:  [phone]

## 2017-07-17 NOTE — Progress Notes (Signed)
OT Evaluation  PTA, pt independent with ADL and mobility, however began using a cane PTA due to pain. Began education regarding compensatory techniques for ADl and functional mobility for ADL. Will follow up to complete ADL education to facilitate safe DC home with husband.    07/17/17 1100  OT Visit Information  Last OT Received On 07/17/17  Assistance Needed +1  History of Present Illness s/p R TKR  Precautions  Precautions Knee  Required Braces or Orthoses Knee Immobilizer - Left  Knee Immobilizer - Right Discontinue once straight leg raise with < 10 degree lag  Restrictions  Weight Bearing Restrictions No  Home Living  Family/patient expects to be discharged to: Private residence  Living Arrangements Spouse/significant other  Available Help at Discharge Available 24 hours/day (for 1 week)  Type of Home House  Home Access Stairs to enter  Entrance Stairs-Number of Steps 4  Entrance Stairs-Rails Right;Left  Home Layout One level  Scientist, physiologicalBathroom Shower/Tub Walk-in shower  Bathroom Toilet Standard  Bathroom Accessibility Yes  How Accessible Accessible via walker  Home Equipment Walker - 2 wheels;BSC;Cane - single point  Prior Function  Level of Independence Independent  Comments used cane for 1 week prior to durgery  Communication  Communication No difficulties  Pain Assessment  Pain Assessment 0-10  Pain Score 7  Pain Location L knee  Pain Descriptors / Indicators Aching  Pain Intervention(s) Limited activity within patient's tolerance;Repositioned  Cognition  Arousal/Alertness Awake/alert  Behavior During Therapy Megan Petersen  Overall Cognitive Status Within Functional Limits for tasks assessed  Upper Extremity Assessment  Upper Extremity Assessment Overall WFL for tasks assessed (s/p RTC repair R shoulder 4/17)  Lower Extremity Assessment  Lower Extremity Assessment Defer to PT evaluation (R TKR 10/17)  Cervical / Trunk Assessment  Cervical / Trunk  Assessment Normal  ADL  Overall ADL's  Independent  General ADL 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[employer]; hasn't worked sincve Sept  Vision- History  Baseline Vision/History Wears glasses  Wears Glasses At all times  Bed Mobility  General bed mobility comments OOB in chair  Transfers  Overall transfer level Needs assistance  Equipment used Rolling walker (2 wheeled)  Transfers Sit to/from Estée LauderStand  Sit to [employer]Stand Min guard  Balance  Overall balance assessment Needs assistance (no falls)  Sitting balance-Leahy Scale Good  Standing balance-Leahy Scale Fair  OT - End of Session  Equipment Utilized During Treatment Gait belt;Rolling walker  Activity Tolerance Patient tolerated treatment well  Patient left in chair;with call bell/phone within reach;with family/visitor present  Nurse Communication Mobility status  OT Assessment  OT Recommendation/Assessment Patient needs continued OT Services  OT Visit Diagnosis Other abnormalities of gait and mobility (R26.89);Pain  Pain - Right/Left Left  Pain - part of body Knee  OT Problem List Obesity;Pain;Decreased range of motion;Impaired balance (sitting and/or standing);Decreased knowledge of use of DME or AE;Decreased knowledge of precautions  OT Plan  OT Frequency (ACUTE ONLY) Min 2X/week  OT Treatment/Interventions (ACUTE ONLY) Self-care/ADL training;DME and/or AE instruction;Therapeutic activities;Patient/family education  AM-PAC OT "6 Clicks" Daily Activity Outcome Measure  Help from another person eating meals? 4  Help from another person taking care of personal grooming? 4  Help from another person toileting, which includes using toliet, bedpan, or urinal? 3  Help from another person bathing (including washing, rinsing, drying)? 3  Help from another person to put on and taking off regular upper body clothing? 4  Help from another person to put on and taking off  regular lower body clothing? 3  6 Click Score 21  ADL G Code Conversion CJ   OT Recommendation  Follow Up Recommendations No OT follow up;Supervision - Intermittent  OT Equipment None recommended by OT  Individuals Consulted  Consulted and Agree with Results and Recommendations Patient;Family member/caregiver  Family Member Consulted husband  Acute Rehab OT Goals  Patient Stated Goal to decrease pain  OT Goal Formulation With patient  Time For Goal Achievement 07/24/17  Potential to Achieve Goals Good  OT Time Calculation  OT Start Time (ACUTE ONLY) 1124  OT Stop Time (ACUTE ONLY) 1146  OT Time Calculation (min) 22 min  OT General Charges  $OT Visit 1 Visit  OT Evaluation  $OT Eval Low Complexity 1 Low  Written Expression  Dominant Hand Right  [facility] Raja Liska, OT/L  [phone] 07/17/2017

## 2017-07-17 NOTE — Progress Notes (Signed)
Physical Therapy Treatment Patient Details Name: Megan RatelRhonda R Petersen MRN: [id number] DOB: [birth year] Today's Date: 07/17/2017    History of Present Illness s/p R TKR    PT Comments    Pt progressing slowly but steadily with mobility but continues ltd by pain/nausea/fatigue.   Follow Up Recommendations  Home health PT     Equipment Recommendations  None recommended by PT    Recommendations for Other Services OT consult     Precautions / Restrictions Precautions Precautions: Knee Required Braces or Orthoses: Knee Immobilizer - Right Knee Immobilizer - Right: Discontinue once straight leg raise with < 10 degree lag Restrictions Weight Bearing Restrictions: No Other Position/Activity Restrictions: WBAT    Mobility  Bed Mobility Overal bed mobility: Needs Assistance Bed Mobility: Sit to Supine       Sit to supine: Min assist   General bed mobility comments: cues for sequence and use of R LE to self assist  Transfers Overall transfer level: Needs assistance Equipment used: Rolling walker (2 wheeled) Transfers: Sit to/from Stand Sit to Stand: Min assist         General transfer comment: cues for LE management and use of UEs to self assist.  Pt stand pvt BSC to bed with RW  Ambulation/Gait Ambulation/Gait assistance: Min assist Ambulation Distance (Feet): 46 Feet Assistive device: Rolling walker (2 wheeled) Gait Pattern/deviations: Step-to pattern;Decreased step length - right;Decreased step length - left;Shuffle;Decreased stance time - left Gait velocity: decr Gait velocity interpretation: Below normal speed for age/gender General Gait Details: cues for sequence, posture and position from [employer]   Stairs            Wheelchair Mobility    Modified Rankin (Stroke Patients Only)       Balance Overall balance assessment: Needs assistance Sitting-balance support: No upper extremity supported;Feet supported Sitting balance-Leahy Scale: Good     Standing  balance support: Bilateral upper extremity supported Standing balance-Leahy Scale: Fair                              Cognition Arousal/Alertness: Awake/alert Behavior During Therapy: WFL for tasks assessed/performed Overall Cognitive Status: Within Functional Limits for tasks assessed                                        Exercises      General Comments        Pertinent Vitals/Pain Pain Assessment: 0-10 Pain Score: 6  Pain Location: L knee Pain Descriptors / Indicators: Aching;Sore Pain Intervention(s): Limited activity within patient's tolerance;Monitored during session;Premedicated before session;Ice applied    Home Living                      Prior Function            PT Goals (current goals can now be found in the care plan section) Acute Rehab PT Goals Patient Stated Goal: Regain IND PT Goal Formulation: With patient Time For Goal Achievement: 07/21/17 Potential to Achieve Goals: Good Progress towards PT goals: Progressing toward goals    Frequency    7X/week      PT Plan Current plan remains appropriate    Co-evaluation              AM-PAC PT "6 Clicks" Daily Activity  Outcome Measure  Difficulty turning over in bed (including adjusting  bedclothes, sheets and blankets)?: Unable Difficulty moving from lying on back to sitting on the side of the bed? : Unable Difficulty sitting down on and standing up from a chair with arms (e.g., wheelchair, bedside commode, etc,.)?: Unable Help needed moving to and from a bed to chair (including a wheelchair)?: A Lot Help needed walking in hospital room?: A Little Help needed climbing 3-5 steps with a railing? : A Lot 6 Click Score: 10    End of Session Equipment Utilized During Treatment: Gait belt;Left knee immobilizer Activity Tolerance: Patient limited by fatigue;Patient limited by pain;Other (comment) Patient left: in bed;with call bell/phone within reach Nurse  Communication: Mobility status PT Visit Diagnosis: Unsteadiness on feet (R26.81);Difficulty in walking, not elsewhere classified (R26.2)     Time: [id number] PT Time Calculation (min) (ACUTE ONLY): 40 min  Charges:  $Gait Training: 8-22 mins $Therapeutic Activity: 8-22 mins                    G Codes:       Pg [phone]    Betania Dizon 07/17/2017, 4:18 PM

## 2017-07-17 NOTE — Evaluation (Signed)
Physical Therapy Evaluation Patient Details Name: Rinaldo RatelRhonda R Kerlin MRN: [id number] DOB: Dec 03, 1960 Today's Date: 07/17/2017   History of Present Illness  s/p R TKR  Clinical Impression  Pt s/p R TKR and presents with decreased R LE strength/ROM, obesity and post op pain limiting functional mobility.  Pt should progress to dc home with family assist.    Follow Up Recommendations Home health PT    Equipment Recommendations  None recommended by PT    Recommendations for Other Services OT consult     Precautions / Restrictions Precautions Precautions: Knee Required Braces or Orthoses: Knee Immobilizer - Right Knee Immobilizer - Right: Discontinue once straight leg raise with < 10 degree lag Restrictions Weight Bearing Restrictions: No Other Position/Activity Restrictions: WBAT      Mobility  Bed Mobility Overal bed mobility: Needs Assistance Bed Mobility: Supine to Sit     Supine to sit: Min assist;Mod assist     General bed mobility comments: cues for sequence and use of R LE to self assist  Transfers Overall transfer level: Needs assistance Equipment used: Rolling walker (2 wheeled) Transfers: Sit to/from Stand Sit to Stand: Min assist;From elevated surface         General transfer comment: cues for LE management and use of UEs to self assist  Ambulation/Gait Ambulation/Gait assistance: Min assist;+2 physical assistance;+2 safety/equipment Ambulation Distance (Feet): 14 Feet Assistive device: Rolling walker (2 wheeled) Gait Pattern/deviations: Step-to pattern;Decreased step length - right;Decreased step length - left;Shuffle;Decreased stance time - left Gait velocity: decr   General Gait Details: cues for sequence, posture and position from [employer]  Stairs            Wheelchair Mobility    Modified Rankin (Stroke Patients Only)       Balance Overall balance assessment: Needs assistance Sitting-balance support: No upper extremity supported;Feet  supported Sitting balance-Leahy Scale: Good     Standing balance support: Bilateral upper extremity supported Standing balance-Leahy Scale: Fair                               Pertinent Vitals/Pain Pain Assessment: 0-10 Pain Score: 6  Pain Location: L knee Pain Descriptors / Indicators: Aching Pain Intervention(s): Limited activity within patient's tolerance;Monitored during session;Premedicated before session;Ice applied    Home Living Family/patient expects to be discharged to:: Private residence Living Arrangements: Spouse/significant other Available Help at Discharge: Available 24 hours/day Type of Home: House Home Access: Stairs to enter Entrance Stairs-Rails: Doctor, general practiceight;Left Entrance Stairs-Number of Steps: 4 Home Layout: One level Home Equipment: Environmental consultantWalker - 2 wheels;Bedside commode;Cane - single point      Prior Function Level of Independence: Independent         Comments: used cane for 1 week prior to surgery     Hand Dominance   Dominant Hand: Right    Extremity/Trunk Assessment   Upper Extremity Assessment Upper Extremity Assessment: Overall WFL for tasks assessed    Lower Extremity Assessment Lower Extremity Assessment: LLE deficits/detail LLE Deficits / Details: 2/5 quads with AAROM at knee -10 - 35    Cervical / Trunk Assessment Cervical / Trunk Assessment: Normal  Communication   Communication: No difficulties  Cognition Arousal/Alertness: Awake/alert Behavior During Therapy: WFL for tasks assessed/performed Overall Cognitive Status: Within Functional Limits for tasks assessed  General Comments      Exercises Total Joint Exercises Ankle Circles/Pumps: AROM;Both;15 reps;Supine Quad Sets: AROM;Both;10 reps;Supine Heel Slides: AAROM;Left;15 reps;Supine Straight Leg Raises: AAROM;Left;10 reps;Supine   Assessment/Plan    PT Assessment Patient needs continued PT services  PT  Problem List Decreased strength;Decreased range of motion;Decreased activity tolerance;Decreased mobility;Decreased balance;Pain;Decreased knowledge of use of DME;Obesity       PT Treatment Interventions DME instruction;Gait training;Stair training;Functional mobility training;Therapeutic activities;Therapeutic exercise;Patient/family education    PT Goals (Current goals can be found in the Care Plan section)  Acute Rehab PT Goals Patient Stated Goal: Regain IND PT Goal Formulation: With patient Time For Goal Achievement: 07/21/17 Potential to Achieve Goals: Good    Frequency 7X/week   Barriers to discharge        Co-evaluation               AM-PAC PT "6 Clicks" Daily Activity  Outcome Measure Difficulty turning over in bed (including adjusting bedclothes, sheets and blankets)?: Unable Difficulty moving from lying on back to sitting on the side of the bed? : Unable Difficulty sitting down on and standing up from a chair with arms (e.g., wheelchair, bedside commode, etc,.)?: Unable Help needed moving to and from a bed to chair (including a wheelchair)?: A Lot Help needed walking in hospital room?: A Little Help needed climbing 3-5 steps with a railing? : A Lot 6 Click Score: 10    End of Session Equipment Utilized During Treatment: Gait belt;Left knee immobilizer Activity Tolerance: Patient limited by fatigue;Patient limited by pain;Other (comment) Patient left: in chair;with call bell/phone within reach;with family/visitor present Nurse Communication: Mobility status PT Visit Diagnosis: Unsteadiness on feet (R26.81);Difficulty in walking, not elsewhere classified (R26.2)    Time: [id number] PT Time Calculation (min) (ACUTE ONLY): 40 min   Charges:   PT Evaluation $PT Eval Low Complexity: 1 Low PT Treatments $Gait Training: 8-22 mins $Therapeutic Exercise: 8-22 mins   PT G Codes:        Pg [phone]   Shivank Pinedo 07/17/2017, 1:02 PM

## 2017-07-18 LAB — CBC
HCT: 33.3 % — ABNORMAL LOW (ref 36.0–46.0)
HEMOGLOBIN: 10.8 g/dL — AB (ref 12.0–15.0)
MCH: 27.6 pg (ref 26.0–34.0)
MCHC: 32.4 g/dL (ref 30.0–36.0)
MCV: 85.2 fL (ref 78.0–100.0)
Platelets: 290 10*3/uL (ref 150–400)
RBC: 3.91 MIL/uL (ref 3.87–5.11)
RDW: 14.4 % (ref 11.5–15.5)
WBC: 15 10*3/uL — AB (ref 4.0–10.5)

## 2017-07-18 NOTE — Progress Notes (Signed)
Physical Therapy Treatment Patient Details Name: Megan RatelRhonda R Petersen MRN: [id number] DOB: Jun 07, 1961 Today's Date: 07/18/2017    History of Present Illness s/p R TKR    PT Comments    Pt progressing well with mobility this am and with no c/o nausea.  Reviewed therex, KI, and stairs.   Follow Up Recommendations  Home health PT     Equipment Recommendations  None recommended by PT    Recommendations for Other Services OT consult     Precautions / Restrictions Precautions Precautions: Knee Required Braces or Orthoses: Knee Immobilizer - Right Knee Immobilizer - Right: Discontinue once straight leg raise with < 10 degree lag Restrictions Weight Bearing Restrictions: No Other Position/Activity Restrictions: WBAT    Mobility  Bed Mobility           Sit to supine: Min assist   General bed mobility comments: NT - OOB with OT and requests back to chair  Transfers Overall transfer level: Needs assistance Equipment used: Rolling walker (2 wheeled) Transfers: Sit to/from Stand Sit to Stand: Min guard;Supervision         General transfer comment: for safety. Cues for UE/LE placement  Ambulation/Gait Ambulation/Gait assistance: Min guard;Supervision Ambulation Distance (Feet): 80 Feet Assistive device: Rolling walker (2 wheeled) Gait Pattern/deviations: Step-to pattern;Decreased step length - right;Decreased step length - left;Shuffle;Decreased stance time - left Gait velocity: decr Gait velocity interpretation: Below normal speed for age/gender General Gait Details: cues for sequence, posture and position from RW   Stairs Stairs: Yes   Stair Management: Two rails;Forwards;Step to pattern Number of Stairs: 2 General stair comments: min cues for sequence and foot placement  Wheelchair Mobility    Modified Rankin (Stroke Patients Only)       Balance                                            Cognition Arousal/Alertness:  Awake/alert Behavior During Therapy: WFL for tasks assessed/performed Overall Cognitive Status: Within Functional Limits for tasks assessed                                        Exercises Total Joint Exercises Ankle Circles/Pumps: AROM;Both;15 reps;Supine Quad Sets: AROM;Both;Supine;15 reps Heel Slides: AAROM;Left;15 reps;Supine;20 reps Straight Leg Raises: AAROM;Left;Supine;20 reps Goniometric ROM: AAROM L knee -8 - 40    General Comments        Pertinent Vitals/Pain Pain Assessment: 0-10 Pain Score: 6  Pain Location: L knee Pain Descriptors / Indicators: Aching;Sore Pain Intervention(s): Limited activity within patient's tolerance;Premedicated before session;Monitored during session;Ice applied    Home Living                      Prior Function            PT Goals (current goals can now be found in the care plan section) Acute Rehab PT Goals PT Goal Formulation: With patient Time For Goal Achievement: 07/21/17 Potential to Achieve Goals: Good Progress towards PT goals: Progressing toward goals    Frequency    7X/week      PT Plan Current plan remains appropriate    Co-evaluation              AM-PAC PT "6 Clicks" Daily Activity  Outcome Measure  Difficulty turning over  in bed (including adjusting bedclothes, sheets and blankets)?: Unable Difficulty moving from lying on back to sitting on the side of the bed? : Unable Difficulty sitting down on and standing up from a chair with arms (e.g., wheelchair, bedside commode, etc,.)?: Unable Help needed moving to and from a bed to chair (including a wheelchair)?: A Little Help needed walking in hospital room?: A Little Help needed climbing 3-5 steps with a railing? : A Little 6 Click Score: 12    End of Session Equipment Utilized During Treatment: Gait belt;Left knee immobilizer Activity Tolerance: Patient limited by fatigue Patient left: in chair;with call bell/phone within  reach Nurse Communication: Mobility status PT Visit Diagnosis: Unsteadiness on feet (R26.81);Difficulty in walking, not elsewhere classified (R26.2)     Time: 1020-1058 PT Time Calculation (min) (ACUTE ONLY): 38 min  Charges:  $Gait Training: 8-22 mins $Therapeutic Exercise: 8-22 mins $Therapeutic Activity: 8-22 mins                    G Codes:       Pg [phone]    Rosey Eide 07/18/2017, 12:13 PM

## 2017-07-18 NOTE — Progress Notes (Signed)
     Subjective: 2 Days Post-Op Procedure(s) (LRB): LEFT TOTAL KNEE ARTHROPLASTY (Left)   Patient reports pain as mild, pain controlled.  No events throughout the night. Ready to be discharged home.   Objective:   VITALS:   Vitals:   07/18/17 0758 07/18/17 0840  BP: (!) 155/67   Pulse:    Resp:    Temp:    SpO2:  96%    Dorsiflexion/Plantar flexion intact Incision: dressing C/D/I No cellulitis present Compartment soft  LABS Recent Labs    07/17/17 0616 07/18/17 0407  HGB 11.2* 10.8*  HCT 33.9* 33.3*  WBC 17.1* 15.0*  PLT 309 290    Recent Labs    07/17/17 0616  NA 135  K 4.5  BUN 12  CREATININE 0.55  GLUCOSE 142*     Assessment/Plan: 2 Days Post-Op Procedure(s) (LRB): LEFT TOTAL KNEE ARTHROPLASTY (Left) Up with therapy Discharge home [facility]. Follow up with Dr. Shelle IronBeane in 2 weeks.  Contact information:  [facility] [address], [address] [place] [id number] [phone]        Anastasio AuerbachMatthew S. Zebbie Ace   PAC  07/18/2017, 8:54 AM

## 2017-07-18 NOTE — Op Note (Signed)
Megan Petersen, Megan Petersen                    ACCOUNT NO.:  MEDICAL RECORD NO.:  [id number]  LOCATION:                                 FACILITY:  PHYSICIAN:  Jene Every, M.D.         DATE OF BIRTH:  DATE OF PROCEDURE:  07/16/2017 DATE OF DISCHARGE:                              OPERATIVE REPORT   PREOPERATIVE DIAGNOSIS:  End-stage osteoarthritis of the left knee with varus deformity.  POSTOPERATIVE DIAGNOSIS:  End-stage osteoarthritis of the left knee with varus deformity.  PROCEDURE PERFORMED:  Left total knee arthroplasty utilizing DePuy Attune rotating platform 5 femur, 5 tibia, 5 insert, 35 patella.  ANESTHESIA:  General.  ASSISTANT:  Andrez Grime, PA.  HISTORY:  This is a 56 year old female with severe end-stage osteoarthritis of the left knee.  She had an elevated BMI.  She had attempted weight reduction, activity modification, but due to the severe pain, was unable to exercise.  She did not want to proceed with any surgical procedures to assist with weight loss.  She had bone-on-bone arthrosis of the medial compartment.  She underwent a total knee on the right and has done well.  She has slightly over 40 BMI.  We elected to proceed with total knee arthroplasty.  Exhausting preoperative optimization was discussed.  Risks and benefits, including bleeding, infection, damage to neurovascular structures, no change in symptoms, worsening symptoms, DVT, PE, anesthetic complications, etc.  TECHNIQUE:  With the patient in supine position after induction of adequate general anesthesia as a spinal was unsuccessful and adductor block, we proceeded with removal of ChloraPrep which was used for the adductor block, she apparently had an allergy to that.  She also had an allergy to [facility].  We then cleaned the knee thoroughly and then we wiped down with isopropyl alcohol and following that and drying of that and ensuring that there was no pulling of the isopropyl alcohol, we  then utilized an isopropyl alcohol Techni-Care prep.  After that was applied and allowed to dry, we then prepped and draped the extremity in the usual sterile fashion.  He had a thigh tourniquet that was placed.  The left lower extremity after appropriate time-out was then exsanguinated. Again, due to her elevated BMI, she was exsanguinated, thigh tourniquet was inflated to 275 mmHg.  Midline incision was then made over the skin. Subcutaneous tissue was dissected.  Electrocautery was utilized to achieve hemostasis.  She had a fair amount of bleeding as her blood pressure was up to 158 with multiple attempts at cautery and then packed the wound to allow for anesthesia, lowering of the blood pressure.  This was finally achieved following the various measures to reduce it to 220 systolic.  Following that, there was less bleeding associated with that. He still had some generalized minor bleeding.  We selected therefore the Mantis coagulator.  This was used in the deep tissues after median parapatellar arthrotomy was then performed.  This was used on the deep tissues only, not the superficial tissues.  I inverted the patella, flexed the knee.  Again, her elevated BMI made challenging exposure. Tricompartmental osteoarthrosis was noted particularly in the medial compartment.  Bone-on-bone arthrosis was noted.  Leksell was utilized to remove osteophytes.  The Leksell was utilized to remove the remnants of the medial and lateral menisci.  A step drill was utilized and the femoral canal was irrigated and the 5-degree left was utilized after irrigating the femoral canal with 9 off the distal femur.  She had no flexion contracture.  This was then pinned and we performed a distal femoral cut.  Following this, I sized the femur off the anterior cortex to a 5, which was equivalent to her contralateral side.  It was pinned in 3 degrees of external rotation.  Then, we posted our distal femoral block upon  that.  We performed anterior, posterior, and chamfer cuts with the block on, protecting the posterior soft tissues at all time with the Y-curve Crego.  Following this, we subluxed the tibia. External alignment guide was used 2 off the defect which was medial, parallel to the shaft bisecting the tibiotalar joint 3 degrees slope. This was pinned.  We performed that cut and we measured our extension gap, which was satisfactory of 5 with full extension.  We then flexed the knee, completed the tibia, sized to a 5 just the medial aspect of the tibial tubercle, harvested bone from the proximal tibia, used our guide and drilled centrally and used our punch guide.  We then turned attention towards the femur, completed that with a box cut bisecting the canal.  This was then removed and planed.  We then placed the trial femur, drilled the lug holes and put our bone graft in the femoral canal.  I placed a 5 insert and reduced it and had full extension, good flexion, good stability, varus and valgus stressing 0-30 degrees.  We then inverted the patella.  It was fairly arthritic and small.  We measured it to a 23, planed 7.5 from that to a 15 utilizing the patellar jig.  This was then planed, measured at 15, residual, drilled our PEG holes medially using a 35.  We placed a patellar component, reduced it, had excellent patellofemoral tracking.  Then, all instrumentation was removed, checked posteriorly.  Remnants of menisci any were removed, cauterized the geniculates.  Pulsatile lavage was used throughout the case and at this point in time thoroughly cleaned all surfaces.  Knee was flexed.  The tibia subluxed.  All surfaces were thoroughly dried. Mixed cement on the back table in the appropriate fashion and then injected in the tibial canal, digitally pressurized and I impacted the cemented tibial tray, redundant cement removed.  I impacted the femur, redundant cement removed.  Placed a trial 5 insert,  reduced it and held axial load throughout the curing of the cement.  We cemented and clamped the patella.  After appropriate curing of the cement and injection of periosteum with 0.25% Marcaine with epinephrine and antibiotic irrigation within the wound, I flexed the knee, redundant cement removed thoroughly with an osteotome.  Copiously irrigated the wound.  There was no active bleeding.  Tourniquet had been deflated at the curing of the cement at 80 minutes.  No significant bleeding noted.  After copious irrigation with pulsatile lavage and antibiotic irrigation, I placed a 5 permanent insert, reduced it and had full extension, full flexion, good stability with varus and valgus stressing 0-30 degrees, negative anterior drawer, excellent patellofemoral tracking.  I then reapproximated the patellar arthrotomy with #1 Vicryl interrupted figure- of-eight sutures and then over-run at with Stratafix.  Again, excellent patellofemoral tracking after this and flexion  to gravity at 90 degrees. Copiously irrigated the subcutaneous tissue, multiple layers of 2-0 and then the skin with staples.  Wound was dressed sterilely, extubated without difficulty and transported to the recovery room in satisfactory condition.  The patient tolerated the procedure well.  No complications.  Assistant, Andrez GrimeJaclyn Bissell, [place].  Blood loss was 275 mL.     Jene EveryJeffrey Carnetta Losada, M.D.     Cordelia PenJB/MEDQ  D:  07/16/2017  T:  07/17/2017  Job:  [id number]

## 2017-07-18 NOTE — Progress Notes (Signed)
Discharge planning, spoke with patient and spouse at bedside. Have chosen [facility] for HH PT. Contacted [facility] for referral. Has RW and 3n1. [phone] 

## 2017-07-18 NOTE — Progress Notes (Signed)
Occupational Therapy Treatment Patient Details Name: Megan RatelRhonda R Petersen MRN: [id number] DOB: [birth year] Today's Date: 07/18/2017    History of present illness s/p R TKR   OT comments  All education was completed this session. Pt not quite ready to step into her shower stall. Reviewed sequence (simulated) pt verbalizes understanding of all education  Follow Up Recommendations  No OT follow up;Supervision - Intermittent    Equipment Recommendations  None recommended by OT    Recommendations for Other Services      Precautions / Restrictions Precautions Precautions: Knee Required Braces or Orthoses: Knee Immobilizer - Right Knee Immobilizer - Right: Discontinue once straight leg raise with < 10 degree lag Restrictions Other Position/Activity Restrictions: WBAT       Mobility Bed Mobility           Sit to supine: Min assist   General bed mobility comments: for LLE  Transfers   Equipment used: Rolling walker (2 wheeled)   Sit to Stand: Min guard         General transfer comment: for safety. Cues for UE/LE placement    Balance                                           ADL either performed or assessed with clinical judgement   ADL Overall ADL's : Needs assistance/impaired                         Toilet Transfer: Min guard;Ambulation;BSC;RW   Toileting- ArchitectClothing Manipulation and Hygiene: Min guard;Sit to/from stand   Tub/ Shower Transfer: Walk-in shower     General ADL Comments: simulated 2 inch ledge for walk in shower; pt will have difficulty clearing with LLE at this time.  Simulated, gave her written instructions and she verbalizes understanding     Vision       Perception     Praxis      Cognition Arousal/Alertness: Awake/alert Behavior During Therapy: WFL for tasks assessed/performed Overall Cognitive Status: Within Functional Limits for tasks assessed                                           Exercises     Shoulder Instructions       General Comments      Pertinent Vitals/ Pain       Pain Score: 6  Pain Location: L knee Pain Descriptors / Indicators: Aching;Sore Pain Intervention(s): Monitored during session;Limited activity within patient's tolerance;Premedicated before session;Repositioned  Home Living                                          Prior Functioning/Environment              Frequency           Progress Toward Goals  OT Goals(current goals can now be found in the care plan section)  Progress towards OT goals: Progressing toward goals     Plan      Co-evaluation                 AM-PAC PT "6 Clicks" Daily Activity     Outcome Measure  Help from another person eating meals?: None Help from another person taking care of personal grooming?: A Little Help from another person toileting, which includes using toliet, bedpan, or urinal?: A Little Help from another person bathing (including washing, rinsing, drying)?: A Little Help from another person to put on and taking off regular upper body clothing?: A Little Help from another person to put on and taking off regular lower body clothing?: A Little 6 Click Score: 19    End of Session    Pain - Right/Left: Left Pain - part of body: Knee   Activity Tolerance Patient tolerated treatment well   Patient Left in chair;with call bell/phone within reach;with family/visitor present   Nurse Communication          Time: [id number] OT Time Calculation (min): 19 min  Charges: OT General Charges $OT Visit: 1 Visit OT Treatments $Self Care/Home Management : 8-22 mins  Megan Petersen, Megan Petersen [phone] 07/18/2017   Megan Petersen 07/18/2017, 11:05 AM

## 2017-07-21 NOTE — Discharge Summary (Signed)
Physician Discharge Summary  Patient ID: Megan Petersen MRN: [id number] DOB/AGE: September 25, 1960 56 y.o.  Admit date: 07/16/2017 Discharge date: 07/18/2017   Procedures:  Procedure(s) (LRB): LEFT TOTAL KNEE ARTHROPLASTY (Left)  Attending Physician:  Dr. Durene RomansMatthew Petersen   Admission Diagnoses:   Left knee primary OA / pain  Discharge Diagnoses:  Principal Problem:   Primary osteoarthritis of left knee  Past Medical History:  Diagnosis Date  . Allergy   . Asthma   . Atypical glandular cells on Pap smear   . Esophageal reflux   . Family history of adverse reaction to anesthesia    brother slow to awaken  . GERD (gastroesophageal reflux disease)    Nexium controls  . Headache   . Hemorrhoids    history of- not a problem at present.  . Hyperlipidemia   . Hypertension   . Impaired fasting glucose    Megan Petersen,Megan -told pt Hgb A1C level was good.  Marland Kitchen. LSIL (low grade squamous intraepithelial lesion) on Pap smear   . Morbid obesity (HCC)   . Osteoarthritis    Bilateral knees  . Osteoarthritis   . Pneumonia 2016  . PONV (postoperative nausea and vomiting)   . Pre-diabetes    hgA1c on 06-09-17 was 5.7  . Primary cough headache   . Rosacea   . Swelling of limb   . Transfusion history    '88- s/p childbirth  . Trigeminal neuralgia   . URI (upper respiratory infection)    cough with green sputum since 11-26-15  . Vertigo    10 yrs ago- only-    HPI:    The patient is a 56 year old female who comes in today for a preoperative History and Physical. The patient is scheduled for a left total knee arthroplasty to be performed by Dr. Javier DockerJeffrey C. Petersen, [facility]. Megan LoserRhonda reports chronic, progressively worsening L knee pain, instability refractory to conservative tx including injections, PT, HEP, activity modifications, medications, relative rest. Her pain is interfering with ADLs and quality of life at this point and Megan desires to proceed with  surgery.  Dr. Shelle IronBeane and the patient mutually agreed to proceed with a total knee replacement. Risks and benefits of the procedure were discussed including stiffness, suboptimal range of motion, persistent pain, infection requiring removal of prosthesis and reinsertion, need for prophylactic antibiotics in the future, for example, dental procedures, possible need for manipulation, revision in the future and also anesthetic complications including DVT, PE, etc. We discussed the perioperative course, time in the hospital, postoperative recovery and the need for elevation to control swelling. We also discussed the predicted range of motion and the probability that squatting and kneeling would be unobtainable in the future. In addition, postoperative anticoagulation was discussed. We have obtained preoperative medical clearance as necessary. Provided illustrated handout and discussed it in detail. They will enroll in the total joint replacement educational forum at the hospital.  Megan Petersen, Megan consistent with sinus infx for her. Unfortunately Megan is prone to these. Her Megan typically puts her on a z-pack for this which works well for her. No fever.  Megan: Megan Petersen, Megan Petersen, Megan Petersen   Discharged Condition: good  Hospital Course:  Patient underwent the above stated procedure on 07/16/2017. Patient tolerated the procedure well and brought to the recovery room in good condition and subsequently to the floor.  POD #1 BP: 140/65 ; Pulse:  87 ; Temp: 99.3 F (37.4 C) ; Resp: 17 Patient reports pain as mild to moderate.  Tolerating POs well.  Admits to flatus.  Denies fever, chills, N/V, SOB, CP. General: WDWN patient in NAD.  Psych:  Appropriate mood and affect.  Neuro:  A&O x 3, Moving all extremities, sensation intact to light touch.  HEENT:  EOMs intact.  Chest:  Even non-labored respirations.  Skin:  Dressing C/D/I, no rashes or  lesions.  Extremities: warm/dry, mild edema, no erythema or echymosis.  No lymphadenopathy.  Pulses: Femoral 2+.  MSK:  ROM: lacks 5 degrees of TKE, MMT: able to perform quad set, (-) Homan's.  LABS  Basename    HGB     11.2  HCT     33.9   POD #2  BP: 142/64 ; Pulse: 88 ; Temp: 99.6 F (37.6 C) ; Resp: 17 Patient reports pain as mild, pain controlled.  No events throughout the night. Ready to be discharged home.  Dorsiflexion/plantar flexion intact, incision: dressing C/D/I, no cellulitis present and compartment soft.   LABS  Basename    HGB     10.8  HCT     33.3    Discharge Exam: General appearance: alert, cooperative and no distress Extremities: Homans sign is negative, no sign of DVT, no edema, redness or tenderness in the calves or thighs and no ulcers, gangrene or trophic changes  Disposition: Home with follow up in 2 weeks   Follow-up Information    Megan Petersen, Megan Petersen Follow up in 2 week(s).   Specialty:  Orthopedic Surgery Contact information: [address] [place] [address] [phone]        [facility] Follow up.   Specialty:  Home Health Services Why:  physical therapy Contact information: [address] [place] [address] [phone]           Discharge Instructions    Call Megan Petersen / Call 911   Complete by:  As directed    If you experience chest pain or shortness of breath, CALL 911 and be transported to the hospital emergency room.  If you develope a fever above 101 F, pus (white drainage) or increased drainage or redness at the wound, or calf pain, call your surgeon's office.   Change dressing   Complete by:  As directed    Maintain surgical dressing until follow up in the clinic. If the edges start to pull up, may reinforce with tape. If the dressing is no longer working, may remove and cover with gauze and tape, but must keep the area dry and clean.  Call with any questions or concerns.   Constipation  Prevention   Complete by:  As directed    Drink plenty of fluids.  Prune juice may be helpful.  You may use a stool softener, such as Colace (over the counter) 100 mg twice a day.  Use MiraLax (over the counter) for constipation as needed.   Diet - low sodium heart healthy   Complete by:  As directed    Discharge instructions   Complete by:  As directed    Maintain surgical dressing until follow up in the clinic. If the edges start to pull up, may reinforce with tape. If the dressing is no longer working, may remove and cover with gauze and tape, but must keep the area dry and clean.  Follow up in 2 [facility] at [facility]. Call with any questions or concerns.  Increase activity slowly as tolerated   Complete by:  As directed    Weight bearing as tolerated with assist device (walker, cane, etc) as directed, use it as long as suggested by your surgeon or therapist, typically at least 4-6 weeks.   TED hose   Complete by:  As directed    Use stockings (TED hose) for 2 weeks on both leg(s).  You may remove them at night for sleeping.      Allergies as of 07/18/2017      Reactions   Ace Inhibitors Cough   Betadine [povidone Iodine] Other (See Comments)   blisters   Chloraprep One Step [chlorhexidine Gluconate] Other (See Comments)   blisters   Demerol [meperidine] Nausea And Vomiting   Erythromycin Nausea And Vomiting   Lyrica [pregabalin] Other (See Comments)   Blisters    Penicillins Diarrhea, Other (See Comments)   Has patient had a PCN reaction causing immediate rash, facial/tongue/Petersen swelling, SOB or lightheadedness with hypotension: No Has patient had a PCN reaction causing severe rash involving mucus membranes or skin necrosis: No Has patient had a PCN reaction that required hospitalization No Has patient had a PCN reaction occurring within the last 10 years: Yes If all of the above answers are "NO", then may proceed with Cephalosporin use.   Shellfish Allergy Nausea  Only, Other (See Comments)   Headaches, tingling in lips   Codeine Rash      Medication List    STOP taking these medications   APPLE CIDER VINEGAR PO   aspirin EC 81 MG tablet   Ginger Root 550 MG Caps   indomethacin 75 MG CR capsule Commonly known as:  INDOCIN SR   multivitamin with minerals Tabs tablet   OSTEO BI-FLEX ADV TRIPLE ST PO   TART CHERRY ADVANCED PO   Turmeric 500 MG Caps     TAKE these medications   budesonide-formoterol 160-4.5 MCG/ACT inhaler Commonly known as:  SYMBICORT Inhale 2 puffs into the lungs 2 (two) times daily. What changed:  when to take this   cloNIDine 0.1 MG tablet Commonly known as:  CATAPRES Take 0.1 mg 2 (two) times daily by mouth. Morning & afternoon.   COMBIVENT RESPIMAT 20-100 MCG/ACT Aers respimat Generic drug:  Ipratropium-Albuterol Inhale 1 puff into the lungs Petersen 6 (six) hours as needed for wheezing.   diclofenac sodium 1 % Gel Commonly known as:  VOLTAREN Apply 2-4 g 4 (four) times daily as needed topically (for pain.).   esomeprazole 40 MG capsule Commonly known as:  NEXIUM Take 1 capsule (40 mg total) by mouth daily before breakfast.   furosemide 40 MG tablet Commonly known as:  LASIX Take 0.5 tablets (20 mg total) by mouth daily.   irbesartan 300 MG tablet Commonly known as:  AVAPRO Take 300 mg daily by mouth.   lidocaine 5 % Commonly known as:  LIDODERM Place 1-3 patches daily as needed onto the skin (pain). Remove & Discard patch within 12 hours or as directed by Megan Petersen   methocarbamol 500 MG tablet Commonly known as:  ROBAXIN Take 1 tablet (500 mg total) by mouth Petersen 6 (six) hours as needed for muscle spasms. What changed:  when to take this   montelukast 10 MG tablet Commonly known as:  SINGULAIR Take 1 tablet (10 mg total) by mouth at bedtime.   mupirocin ointment 2 % Commonly known as:  BACTROBAN Place 1 application at bedtime into the nose.   oxyCODONE-acetaminophen 10-325 MG tablet Commonly  known  as:  PERCOCET Take 1-2 tablets by mouth Petersen 4 (four) hours as needed for pain (severe).   polyethylene glycol packet Commonly known as:  MIRALAX / GLYCOLAX Take 17 g by mouth daily.   rivaroxaban 10 MG Tabs tablet Commonly known as:  XARELTO Take 1 tablet (10 mg total) by mouth daily.   STOOL SOFTENER 250 MG capsule Generic drug:  docusate sodium Take 250 mg daily by mouth.   traMADol 50 MG tablet Commonly known as:  ULTRAM Take 1 tablet (50 mg total) by mouth Petersen 6 (six) hours as needed for moderate pain. What changed:    when to take this  reasons to take this   Vitamin D (Ergocalciferol) 50000 units Caps capsule Commonly known as:  DRISDOL Take 50,000 Units Petersen Saturday by mouth. In the morning.            Discharge Care Instructions  (From admission, onward)        Start     Ordered   07/18/17 0000  Change dressing    Comments:  Maintain surgical dressing until follow up in the clinic. If the edges start to pull up, may reinforce with tape. If the dressing is no longer working, may remove and cover with gauze and tape, but must keep the area dry and clean.  Call with any questions or concerns.   07/18/17 [id number]       Signed: Anastasio AuerbachMatthew S. Jenascia Bumpass   PA-C  07/21/2017, 9:37 AM

## 2017-08-01 ENCOUNTER — Ambulatory Visit (INDEPENDENT_AMBULATORY_CARE_PROVIDER_SITE_OTHER): Payer: BLUE CROSS/BLUE SHIELD | Admitting: Physical Therapy

## 2017-08-01 ENCOUNTER — Encounter: Payer: Self-pay | Admitting: Physical Therapy

## 2017-08-01 DIAGNOSIS — R600 Localized edema: Secondary | ICD-10-CM | POA: Diagnosis not present

## 2017-08-01 DIAGNOSIS — R2689 Other abnormalities of gait and mobility: Secondary | ICD-10-CM

## 2017-08-01 DIAGNOSIS — M25562 Pain in left knee: Secondary | ICD-10-CM

## 2017-08-01 DIAGNOSIS — M6281 Muscle weakness (generalized): Secondary | ICD-10-CM | POA: Diagnosis not present

## 2017-08-01 DIAGNOSIS — M25662 Stiffness of left knee, not elsewhere classified: Secondary | ICD-10-CM | POA: Diagnosis not present

## 2017-08-01 NOTE — Patient Instructions (Addendum)
Strengthening: Hip Abduction (Side-Lying)    Tighten muscles on front of left thigh, then lift leg ____ inches from surface, keeping knee locked.  Repeat __until fatigue__ times per set. Do __1__ sets per session. Do __4__ sessions per day.  Strengthening: Hip Extension (Prone)    Tighten muscles on front of left thigh, then lift leg ____ inches from surface, keeping knee locked. Repeat _until fatigue___ times per set. Do __1__ sets per session. Do __4__ sessions per day.  Strengthening: Straight Leg Raise (Phase 1)    Tighten muscles on front of left thigh, then lift leg ____ inches from surface, keeping knee locked.  Repeat __to fatigue__ times per set. Do __1__ sets per session. Do __4__ sessions per day.  Quad Sets    Squeeze pelvic floor and hold. Tighten top of left thigh, pressing knee down. Hold for 5-10___ seconds. Relax for __2-3_ seconds. Repeat _10__ times. Do _4__ times a day. Repeat with other leg.  Heel Slides - use a strap behind knee and pull up with arms.     Squeeze pelvic floor and hold. Slide left heel along bed towards bottom. Hold for _2-3__ seconds. Slide back to flat knee position. Repeat _10__ times. Do __4_ times a day.  KNEE: Extension, Long Arc Quads - Sitting    Raise leg until knee is straight. Hold 2-3 secs, repeat __10_ reps per set, _4__ sets per day.  Heel Raises    Stand with support. Tighten pelvic floor and hold. With knees straight, raise heels off ground. Hold _1-2__ seconds. Relax for _1-2__ seconds. Repeat _10-20__ times. Do _4__ times a day.  Supine: Leg Stretch with Strap (Super Advanced)    Lie on back with one leg straight. Hook strap around other foot. Straighten knee. Raise leg to maximal stretch and straighten knee further by tightening quadriceps. Slowly press other leg down as close to floor as possible. Keep lower abdominals tight. Hold _30__ seconds. Warning: Intense stretch. Stay within tolerance. Repeat __3_  times per session. Do _4__ sessions per day.

## 2017-08-01 NOTE — Therapy (Signed)
[facility] [place] [address] [place], [place], [address] Phone: [phone]   Fax:  [phone]  Physical Therapy Evaluation  Patient Details  Name: Megan Petersen MRN: [id number] Date of Birth: 1961-06-21 Referring Provider: Dr Leandra Kern   Encounter Date: 08/01/2017  PT End of Session - 08/01/17 1401    Visit Number  1    Number of Visits  18    Date for PT Re-Evaluation  08/29/17    PT Start Time  1401    PT Stop Time  1455    PT Time Calculation (min)  54 min       Past Medical History:  Diagnosis Date  . Allergy   . Asthma   . Atypical glandular cells on Pap smear   . Esophageal reflux   . Family history of adverse reaction to anesthesia    brother slow to awaken  . GERD (gastroesophageal reflux disease)    Nexium controls  . Headache   . Hemorrhoids    history of- not a problem at present.  . Hyperlipidemia   . Hypertension   . Impaired fasting glucose    Dr. Jenny Reichmann -told pt Hgb A1C level was good.  Marland Kitchen LSIL (low grade squamous intraepithelial lesion) on Pap smear   . Morbid obesity (HCC)   . Osteoarthritis    Bilateral knees  . Osteoarthritis   . Pneumonia 2016  . PONV (postoperative nausea and vomiting)   . Pre-diabetes    hgA1c on 06-09-17 was 5.7  . Primary cough headache   . Rosacea   . Swelling of limb   . Transfusion history    '88- s/p childbirth  . Trigeminal neuralgia   . URI (upper respiratory infection)    cough with green sputum since 11-26-15  . Vertigo    10 yrs ago- only-    Past Surgical History:  Procedure Laterality Date  . CHOLECYSTECTOMY     history of gallstones  . LEEP    . right knee meniscus repair  2014  . SHOULDER OPEN ROTATOR CUFF REPAIR Right 12/01/2015   Procedure: RIGHT SHOULDER MINI OPEN ROTATOR CUFF REPAIR WITH RESECTON OF DISTAL CLAVICLE AND SUBACROMIAL DECOMPRESSION;  Surgeon: Jene Every, MD;  Location: [facility];  Service: Orthopedics;  Laterality:  Right;  . TOTAL KNEE ARTHROPLASTY Right 06/13/2016   Procedure: RIGHT TOTAL KNEE ARTHROPLASTY;  Surgeon: Jene Every, MD;  Location: [facility];  Service: Orthopedics;  Laterality: Right;  . TOTAL KNEE ARTHROPLASTY Left 07/16/2017   Procedure: LEFT TOTAL KNEE ARTHROPLASTY;  Surgeon: Jene Every, MD;  Location: [facility];  Service: Orthopedics;  Laterality: Left;  2.5 hrs    There were no vitals filed for this visit.   Subjective Assessment - 08/01/17 1401    Subjective  Pt reports she had an elective Lt TKA 2 wks ago, she has had a little more trouble with this one.  She has been doing exercise on her own with her husbands help    Pertinent History  Rt TKA 05/2016, MRI Rt foot due to pain - has multiple things wrong with it and seeing MD for this.  Pt reports it stays swollen.     Patient Stated Goals  bend the knee like her Rt     Currently in Pain?  Yes    Pain Score  5     Pain Location  Knee    Pain Orientation  Left    Pain Descriptors / Indicators  Tightness  Pain Onset  1 to 4 weeks ago    Pain Frequency  Constant    Aggravating Factors   standing on leg, bending    Pain Relieving Factors  ice & meds         [facility] PT Assessment - 08/01/17 0001      Assessment   [facility]  Lt TKA    Referring Provider  Dr Leandra KernJeffery Beane    Onset Date/Surgical Date  07/16/17    Hand Dominance  Right    Next MD Visit  08/27/17    Prior Therapy  not for this knee      Precautions   Precautions  None    Precaution Comments  KI for comfort      Restrictions   Weight Bearing Restrictions  No      Balance Screen   Has the patient fallen in the past 6 months  No      Home Environment   Living Environment  Private residence    Living Arrangements  Spouse/significant other    Home Access  Stairs to enter 4, takes one at a time    Home Layout  One level      Prior Function   Vocation  Full time employment currently out of work at least 10 more weeks.     Vocation Requirements   shipping - envelopes, has to wear steel toed shoes      Observation/Other Assessments   Focus on Therapeutic Outcomes (FOTO)   72% limited      Functional Tests   Functional tests  Single leg stance      Single Leg Stance   Comments  Rt 11secs, Lt unable      ROM / Strength   AROM / PROM / Strength  AROM;PROM;Strength      AROM   AROM Assessment Site  Knee    Right/Left Knee  Left;Right    Right Knee Extension  0    Right Knee Flexion  122    Left Knee Extension  -12    Left Knee Flexion  20      PROM   PROM Assessment Site  Knee    Right/Left Knee  Left    Left Knee Extension  -10    Left Knee Flexion  57      Strength   Strength Assessment Site  Hip;Knee;Ankle    Right/Left Hip  Left Rt WNL    Left Hip Flexion  3-/5    Left Hip ABduction  3-/5    Right/Left Knee  -- Rt WNL, Lt grossly 4-/5 with pain    Right/Left Ankle  -- WNL             Objective measurements completed on examination: See above findings.      [facility] Adult PT Treatment/Exercise - 08/01/17 0001      Exercises   Exercises  Other Exercises    Other Exercises   performed per her HEP sheet      Modalities   Modalities  Electrical Stimulation;Vasopneumatic      Programme researcher, broadcasting/film/videolectrical Stimulation   Electrical Stimulation Location  Lt knee    Electrical Stimulation Action  ion repelling    Electrical Stimulation Parameters  to tolerance    Electrical Stimulation Goals  Edema;Pain      Vasopneumatic   Number Minutes Vasopneumatic   20 minutes    Vasopnuematic Location   Knee Lt    Vasopneumatic Pressure  Medium  Vasopneumatic Temperature   3*                  PT Long Term Goals - 08/01/17 1552      PT LONG TERM GOAL #1   Title  Independent with advanced HEP ( 09/12/17)    Time  4    Period  Weeks    Status  New      PT LONG TERM GOAL #2   Title  improve Lt Knee ROM -4 extension to 118 flexion ( 1/198/19)      Time  4    Period  Weeks    Status  New      PT LONG TERM GOAL  #3   Title  demo Lt LE strength =/> 5-/5 to allow alternating gait on stairs ( 09/12/17)    Time  4    Period  Weeks    Status  New      PT LONG TERM GOAL #4   Title  improve gait pattern with minimal limp with improved gait pattern with walking ( 09/12/17)      Time  4    Period  Weeks    Status  New      PT LONG TERM GOAL #5   Title  Maintain FOTO =/< 52% limited, CK level  ( 09/12/17)      Time  4    Period  Weeks    Status  New             Plan - 08/01/17 1544    Clinical Impression Statement  56 yo female 2 wks s/p elective Lt TKA.  She has been performing exercises at home and using ice/elevation.  She has limited Lt knee ROM and LE strength.  Is ambulating with a RW and having difficulty taking her full weight on the Lt LE. There is a lot of edema present, steristrips in place and acouple areas in the incision that are still healing.     Clinical Presentation  Evolving    Clinical Decision Making  Low    Rehab Potential  Excellent    PT Frequency  3x / week    PT Duration  6 weeks    PT Treatment/Interventions  Gait training;Neuromuscular re-education;Manual techniques;Functional mobility training;Moist Heat;Ultrasound;Therapeutic activities;Patient/family education;Taping;Vasopneumatic Device;Therapeutic exercise;Cryotherapy;Electrical Stimulation;Passive range of motion    PT Next Visit Plan  increase Lt knee ROM, LE strength and proprioception, modalities for edema       Patient will benefit from skilled therapeutic intervention in order to improve the following deficits and impairments:  Abnormal gait, Pain, Decreased mobility, Decreased scar mobility, Increased muscle spasms, Decreased strength, Decreased range of motion, Decreased endurance, Decreased balance, Difficulty walking, Increased edema  Visit Diagnosis: Muscle weakness (generalized) - Plan: PT plan of care cert/re-cert  Localized edema - Plan: PT plan of care cert/re-cert  Stiffness of left knee, not  elsewhere classified - Plan: PT plan of care cert/re-cert  Acute pain of left knee - Plan: PT plan of care cert/re-cert  Other abnormalities of gait and mobility - Plan: PT plan of care cert/re-cert  [facility] PT PB G-CODES - 08/01/17 1438    Functional Assessment Tool Used   FOTO and professional judgement    Functional Limitations  Mobility: Walking and moving around    Mobility: Walking and Moving Around Current Status  At least 60 percent but less than 80 percent impaired, limited or restricted    Mobility: Walking and Moving Around Goal  Status [phone])  At least 40 percent but less than 60 percent impaired, limited or restricted        Problem List Patient Active Problem List   Diagnosis Date Noted  . Primary osteoarthritis of left knee 07/16/2017  . Right knee DJD 06/13/2016  . Complete rotator cuff tear 12/01/2015  . Wheezing 04/07/2014  . Impaired fasting glucose 07/26/2013  . Essential hypertension, benign 07/26/2013  . Back pain 07/26/2013  . Knee pain, bilateral 01/10/2013  . Morbid obesity (HCC) 01/10/2013  . Headache(784.0) 12/06/2012    Roderic Scarce PT  08/01/2017, 3:59 PM  [facility] [address] [place], [place], [address] Phone: [phone]   Fax:  [phone]  Name: LILLER YOHN MRN: [id number] Date of Birth: 02/19/1961

## 2017-08-06 ENCOUNTER — Ambulatory Visit (INDEPENDENT_AMBULATORY_CARE_PROVIDER_SITE_OTHER): Payer: BLUE CROSS/BLUE SHIELD | Admitting: Rehabilitative and Restorative Service Providers"

## 2017-08-06 ENCOUNTER — Encounter: Payer: Self-pay | Admitting: Rehabilitative and Restorative Service Providers"

## 2017-08-06 DIAGNOSIS — M6281 Muscle weakness (generalized): Secondary | ICD-10-CM

## 2017-08-06 DIAGNOSIS — M25562 Pain in left knee: Secondary | ICD-10-CM | POA: Diagnosis not present

## 2017-08-06 DIAGNOSIS — M25662 Stiffness of left knee, not elsewhere classified: Secondary | ICD-10-CM | POA: Diagnosis not present

## 2017-08-06 DIAGNOSIS — R600 Localized edema: Secondary | ICD-10-CM

## 2017-08-06 DIAGNOSIS — R2689 Other abnormalities of gait and mobility: Secondary | ICD-10-CM

## 2017-08-06 NOTE — Therapy (Signed)
[facility] [place] [address] [place], [place], [address] Phone: [phone]   Fax:  [phone]  Physical Therapy Treatment  Patient Details  Name: Megan Petersen MRN: [id number] Date of Birth: August 28, 1960 Referring Provider: Dr Leandra Kern   Encounter Date: 08/06/2017  PT End of Session - 08/06/17 1014    Visit Number  2    Number of Visits  18    Date for PT Re-Evaluation  08/29/17    PT Start Time  1014    PT Stop Time  1110    PT Time Calculation (min)  56 min    Activity Tolerance  Patient tolerated treatment well       Past Medical History:  Diagnosis Date  . Allergy   . Asthma   . Atypical glandular cells on Pap smear   . Esophageal reflux   . Family history of adverse reaction to anesthesia    brother slow to awaken  . GERD (gastroesophageal reflux disease)    Nexium controls  . Headache   . Hemorrhoids    history of- not a problem at present.  . Hyperlipidemia   . Hypertension   . Impaired fasting glucose    Dr. Jenny Reichmann -told pt Hgb A1C level was good.  Marland Kitchen LSIL (low grade squamous intraepithelial lesion) on Pap smear   . Morbid obesity (HCC)   . Osteoarthritis    Bilateral knees  . Osteoarthritis   . Pneumonia 2016  . PONV (postoperative nausea and vomiting)   . Pre-diabetes    hgA1c on 06-09-17 was 5.7  . Primary cough headache   . Rosacea   . Swelling of limb   . Transfusion history    '88- s/p childbirth  . Trigeminal neuralgia   . URI (upper respiratory infection)    cough with green sputum since 11-26-15  . Vertigo    10 yrs ago- only-    Past Surgical History:  Procedure Laterality Date  . CHOLECYSTECTOMY     history of gallstones  . LEEP    . right knee meniscus repair  2014  . SHOULDER OPEN ROTATOR CUFF REPAIR Right 12/01/2015   Procedure: RIGHT SHOULDER MINI OPEN ROTATOR CUFF REPAIR WITH RESECTON OF DISTAL CLAVICLE AND SUBACROMIAL DECOMPRESSION;  Surgeon: Jene Every, MD;   Location: [facility];  Service: Orthopedics;  Laterality: Right;  . TOTAL KNEE ARTHROPLASTY Right 06/13/2016   Procedure: RIGHT TOTAL KNEE ARTHROPLASTY;  Surgeon: Jene Every, MD;  Location: [facility];  Service: Orthopedics;  Laterality: Right;  . TOTAL KNEE ARTHROPLASTY Left 07/16/2017   Procedure: LEFT TOTAL KNEE ARTHROPLASTY;  Surgeon: Jene Every, MD;  Location: [facility];  Service: Orthopedics;  Laterality: Left;  2.5 hrs    There were no vitals filed for this visit.  Subjective Assessment - 08/06/17 1018    Subjective  Patient reports that she has continued pain and tightness in Lt knee. Feels swollen and tight - different from the Rt knee.     Currently in Pain?  Yes    Pain Score  6     Pain Location  Knee    Pain Orientation  Left    Pain Descriptors / Indicators  Tightness    Pain Type  Surgical pain    Pain Onset  1 to 4 weeks ago    Pain Frequency  Constant         [facility] PT Assessment - 08/06/17 0001      Assessment   Medical Diagnosis  Lt TKA    Referring Provider  Dr Leandra KernJeffery Beane    Onset Date/Surgical Date  07/16/17    Hand Dominance  Right    Next MD Visit  08/27/17    Prior Therapy  not for this knee      AROM   AROM Assessment Site  Knee    Right/Left Knee  Left    Left Knee Extension  -9    Left Knee Flexion  80 AAROM with strap pt assist only       PROM   PROM Assessment Site  Knee    Right/Left Knee  Left    Left Knee Extension  -8    Left Knee Flexion  83  supine w/ PT assist; prone 77 deg w/pt/PT assist                   [facility] Adult PT Treatment/Exercise - 08/06/17 0001      Knee/Hip Exercises: Stretches   Passive Hamstring Stretch  Left;3 reps;30 seconds supine with strap       Knee/Hip Exercises: Aerobic   Nustep  L4 x 6 min       Knee/Hip Exercises: Standing   Knee Flexion  AROM;Left;2 sets;10 reps standing at stairs active knee flexion     Hip Flexion  AROM;Left;2 sets;10 reps;Knee bent working on ROM - touching Lt foot on 8 inch  step     Hip Abduction  AROM;Left;1 set;10 reps leading with heel       Knee/Hip Exercises: Supine   Quad Sets  Strengthening;Left;1 set;10 reps 10 sec hold     Quad Sets Limitations  assisted knee extension by PT for PROM measurement     Heel Slides  AAROM;Left;10 reps assist with strap 10 sec hold in flexion     Heel Slides Limitations  80 deg assisted with strap       Knee/Hip Exercises: Sidelying   Clams  Rt sidelying - Lt clam (difficult)      Knee/Hip Exercises: Prone   Other Prone Exercises  glut sets 10 xsec x 10; knee extension wt bearing on toe and lifting knee 5 sec hold x 10 reps     Other Prone Exercises  assisted knee flexion with strap and PT assist 20-30 sec hold x 5 reps       Electrical Stimulation   Electrical Stimulation Location  Lt knee    Electrical Stimulation Action  ion repelling     Electrical Stimulation Parameters  to tolerance    Electrical Stimulation Goals  Edema;Pain      Vasopneumatic   Number Minutes Vasopneumatic   15 minutes    Vasopnuematic Location   Knee Lt    Vasopneumatic Pressure  Medium    Vasopneumatic Temperature   3*               PT Short Term Goals - 08/06/17 1015      PT SHORT TERM GOAL #1   Period  Days        PT Long Term Goals - 08/06/17 1016      PT LONG TERM GOAL #1   Title  Independent with advanced HEP ( 09/12/17)    Time  4    Period  Weeks    Status  On-going      PT LONG TERM GOAL #2   Title  improve Lt Knee ROM -4 extension to 118 flexion ( 1/198/19)      Time  4  Period  Weeks    Status  On-going      PT LONG TERM GOAL #3   Title  demo Lt LE strength =/> 5-/5 to allow alternating gait on stairs ( 09/12/17)    Time  4    Period  Weeks    Status  On-going      PT LONG TERM GOAL #4   Title  improve gait pattern with minimal limp with improved gait pattern with walking ( 09/12/17)      Time  4    Period  Weeks    Status  On-going      PT LONG TERM GOAL #5   Title  Maintain FOTO =/< 52%  limited, CK level  ( 09/12/17)      Time  4    Period  Weeks    Status  On-going            Plan - 08/06/17 1017    Clinical Impression Statement  Continued tightness and pain in the Lt knee. [name] has difficulty sleeping. She is working on LandAmerica FinancialHEP and working well in therapy. Progressing gradually toward goals of therapy.     Rehab Potential  Excellent    PT Frequency  3x / week    PT Duration  6 weeks    PT Treatment/Interventions  Gait training;Neuromuscular re-education;Manual techniques;Functional mobility training;Moist Heat;Ultrasound;Therapeutic activities;Patient/family education;Taping;Vasopneumatic Device;Therapeutic exercise;Cryotherapy;Electrical Stimulation;Passive range of motion    PT Next Visit Plan  increase Lt knee ROM, LE strength and proprioception, modalities for edema    Consulted and Agree with Plan of Care  Patient       Patient will benefit from skilled therapeutic intervention in order to improve the following deficits and impairments:  Abnormal gait, Pain, Decreased mobility, Decreased scar mobility, Increased muscle spasms, Decreased strength, Decreased range of motion, Decreased endurance, Decreased balance, Difficulty walking, Increased edema  Visit Diagnosis: Muscle weakness (generalized)  Localized edema  Stiffness of left knee, not elsewhere classified  Acute pain of left knee  Other abnormalities of gait and mobility     Problem List Patient Active Problem List   Diagnosis Date Noted  . Primary osteoarthritis of left knee 07/16/2017  . Right knee DJD 06/13/2016  . Complete rotator cuff tear 12/01/2015  . Wheezing 04/07/2014  . Impaired fasting glucose 07/26/2013  . Essential hypertension, benign 07/26/2013  . Back pain 07/26/2013  . Knee pain, bilateral 01/10/2013  . Morbid obesity (HCC) 01/10/2013  . Headache(784.0) 12/06/2012    Celyn Rober MinionP Holt PT, MPH  08/06/2017, 10:59 AM  [facility] [address] [place], [place], [address] Phone: [phone]   Fax:  [phone]  Name: Rinaldo RatelRhonda R Foutz MRN: [id number] Date of Birth: [birth year]

## 2017-08-08 ENCOUNTER — Encounter: Payer: Self-pay | Admitting: Rehabilitative and Restorative Service Providers"

## 2017-08-08 ENCOUNTER — Ambulatory Visit (INDEPENDENT_AMBULATORY_CARE_PROVIDER_SITE_OTHER): Payer: BLUE CROSS/BLUE SHIELD | Admitting: Rehabilitative and Restorative Service Providers"

## 2017-08-08 DIAGNOSIS — M25662 Stiffness of left knee, not elsewhere classified: Secondary | ICD-10-CM

## 2017-08-08 DIAGNOSIS — M25562 Pain in left knee: Secondary | ICD-10-CM

## 2017-08-08 DIAGNOSIS — R600 Localized edema: Secondary | ICD-10-CM | POA: Diagnosis not present

## 2017-08-08 DIAGNOSIS — M6281 Muscle weakness (generalized): Secondary | ICD-10-CM

## 2017-08-08 NOTE — Patient Instructions (Addendum)
Standing - leg swings forward and back  Sitting; Unilateral    Sit with left leg straight out in front of you, heel down, press knee straight, assist by pushing down with hands just above the knee on the thigh.  Slowly lean forward, keeping back straight. Hold _30__ seconds. Repeat __3_ times per session. Do _3__ sessions per day.   Rolling tight muscles with a rolling pin or "The Stick"

## 2017-08-08 NOTE — Therapy (Signed)
[facility] [place] [address] [place], [place], [address] Phone: [phone]   Fax:  [phone]  Physical Therapy Treatment  Patient Details  Name: Megan Petersen MRN: [id number] Date of Birth: Jul 06, 1961 Referring Provider: Dr Leandra Kern   Encounter Date: 08/08/2017  PT End of Session - 08/08/17 0808    Visit Number  3    Number of Visits  18    Date for PT Re-Evaluation  08/29/17    PT Start Time  0804    PT Stop Time  0900    PT Time Calculation (min)  56 min    Activity Tolerance  Patient tolerated treatment well       Past Medical History:  Diagnosis Date  . Allergy   . Asthma   . Atypical glandular cells on Pap smear   . Esophageal reflux   . Family history of adverse reaction to anesthesia    brother slow to awaken  . GERD (gastroesophageal reflux disease)    Nexium controls  . Headache   . Hemorrhoids    history of- not a problem at present.  . Hyperlipidemia   . Hypertension   . Impaired fasting glucose    Dr. Jenny Reichmann -told pt Hgb A1C level was good.  Marland Kitchen LSIL (low grade squamous intraepithelial lesion) on Pap smear   . Morbid obesity (HCC)   . Osteoarthritis    Bilateral knees  . Osteoarthritis   . Pneumonia 2016  . PONV (postoperative nausea and vomiting)   . Pre-diabetes    hgA1c on 06-09-17 was 5.7  . Primary cough headache   . Rosacea   . Swelling of limb   . Transfusion history    '88- s/p childbirth  . Trigeminal neuralgia   . URI (upper respiratory infection)    cough with green sputum since 11-26-15  . Vertigo    10 yrs ago- only-    Past Surgical History:  Procedure Laterality Date  . CHOLECYSTECTOMY     history of gallstones  . LEEP    . right knee meniscus repair  2014  . SHOULDER OPEN ROTATOR CUFF REPAIR Right 12/01/2015   Procedure: RIGHT SHOULDER MINI OPEN ROTATOR CUFF REPAIR WITH RESECTON OF DISTAL CLAVICLE AND SUBACROMIAL DECOMPRESSION;  Surgeon: Jene Every, MD;   Location: [facility];  Service: Orthopedics;  Laterality: Right;  . TOTAL KNEE ARTHROPLASTY Right 06/13/2016   Procedure: RIGHT TOTAL KNEE ARTHROPLASTY;  Surgeon: Jene Every, MD;  Location: [facility];  Service: Orthopedics;  Laterality: Right;  . TOTAL KNEE ARTHROPLASTY Left 07/16/2017   Procedure: LEFT TOTAL KNEE ARTHROPLASTY;  Surgeon: Jene Every, MD;  Location: [facility];  Service: Orthopedics;  Laterality: Left;  2.5 hrs    There were no vitals filed for this visit.  Subjective Assessment - 08/08/17 0809    Subjective  Patient reports that she has a knot in the outside part of the thigh. She noticed it when she got out of the shower this morning. The outside of the thigh is just tight and sore. She was sore following the last visit but just kept working.     Currently in Pain?  Yes    Pain Score  7     Pain Location  Knee    Pain Orientation  Left    Pain Descriptors / Indicators  Tightness    Pain Type  Surgical pain  [facility] Adult PT Treatment/Exercise - 08/08/17 0001      Therapeutic Activites    Therapeutic Activities  -- instructed in myofacial release with the stick       Knee/Hip Exercises: Aerobic   Nustep  L5 x 8 min       Knee/Hip Exercises: Standing   Hip Flexion  AROM;Left;2 sets;10 reps;Knee bent working on ROM - touching Lt foot on 8 inch step     Hip Abduction  AROM;Left;1 set;10 reps leading with heel     Hip Extension  AROM;Left;1 set;10 reps    Gait Training  ambulating laps around the gym between exercise - working on gait and     Other Standing Knee Exercises  hip swings standing       Knee/Hip Exercises: Seated   Heel Slides  AAROM;Left scooting forward for knee flexioin stretch 20 sec x 5     Other Seated Knee/Hip Exercises  hamstring stretch knee extended 20 sec x 2       Knee/Hip Exercises: Supine   Quad Sets  Strengthening;Left;1 set;10 reps 10 sec hold       Knee/Hip Exercises: Sidelying   Hip ABduction   AAROM;Left;1 set;10 reps assist of PT       Knee/Hip Exercises: Prone   Other Prone Exercises  glut sets 10 xsec x 10; knee extension wt bearing on toe and lifting knee 5 sec hold x 10 reps     Other Prone Exercises  assisted knee flexion with strap and PT assist 20-30 sec hold x 5 reps       Modalities   Modalities  -- moist heat lateral thigh while on Nustep       Moist Heat Therapy   Number Minutes Moist Heat  15 Minutes    Moist Heat Location  -- anterior lateral thigh in area of tightness       Electrical Stimulation   Electrical Stimulation Location  Lt knee    Electrical Stimulation Action  ion repelling     Electrical Stimulation Parameters  to tolerance    Electrical Stimulation Goals  Edema;Pain      Vasopneumatic   Number Minutes Vasopneumatic   15 minutes    Vasopnuematic Location   Knee Lt    Vasopneumatic Pressure  Low    Vasopneumatic Temperature   3*             PT Education - 08/08/17 0914    Education provided  Yes    Education Details  HEP     Person(s) Educated  Patient    Methods  Explanation;Demonstration;Tactile cues;Verbal cues;Handout    Comprehension  Verbalized understanding;Returned demonstration;Verbal cues required;Tactile cues required       PT Short Term Goals - 08/06/17 1015      PT SHORT TERM GOAL #1   Period  Days        PT Long Term Goals - 08/06/17 1016      PT LONG TERM GOAL #1   Title  Independent with advanced HEP ( 09/12/17)    Time  4    Period  Weeks    Status  On-going      PT LONG TERM GOAL #2   Title  improve Lt Knee ROM -4 extension to 118 flexion ( 1/198/19)      Time  4    Period  Weeks    Status  On-going      PT LONG TERM GOAL #3   Title  demo Lt LE strength =/> 5-/5 to allow alternating gait on stairs ( 09/12/17)    Time  4    Period  Weeks    Status  On-going      PT LONG TERM GOAL #4   Title  improve gait pattern with minimal limp with improved gait pattern with walking ( 09/12/17)      Time  4     Period  Weeks    Status  On-going      PT LONG TERM GOAL #5   Title  Maintain FOTO =/< 52% limited, CK level  ( 09/12/17)      Time  4    Period  Weeks    Status  On-going            Plan - 08/08/17 16100811    Clinical Impression Statement  Increased pain lateral quad with palpable tightness noted. She continues to ambulate with walker with limp Lt LE. [name] continues to have difficulty tolerating the ROM and exercises. Gradual progress with rehab.     Rehab Potential  Excellent    PT Frequency  3x / week    PT Duration  6 weeks    PT Treatment/Interventions  Gait training;Neuromuscular re-education;Manual techniques;Functional mobility training;Moist Heat;Ultrasound;Therapeutic activities;Patient/family education;Taping;Vasopneumatic Device;Therapeutic exercise;Cryotherapy;Electrical Stimulation;Passive range of motion    PT Next Visit Plan  increase Lt knee ROM, LE strength and proprioception, modalities for edema    Consulted and Agree with Plan of Care  Patient       Patient will benefit from skilled therapeutic intervention in order to improve the following deficits and impairments:  Abnormal gait, Pain, Decreased mobility, Decreased scar mobility, Increased muscle spasms, Decreased strength, Decreased range of motion, Decreased endurance, Decreased balance, Difficulty walking, Increased edema  Visit Diagnosis: Muscle weakness (generalized)  Localized edema  Stiffness of left knee, not elsewhere classified  Acute pain of left knee     Problem List Patient Active Problem List   Diagnosis Date Noted  . Primary osteoarthritis of left knee 07/16/2017  . Right knee DJD 06/13/2016  . Complete rotator cuff tear 12/01/2015  . Wheezing 04/07/2014  . Impaired fasting glucose 07/26/2013  . Essential hypertension, benign 07/26/2013  . Back pain 07/26/2013  . Knee pain, bilateral 01/10/2013  . Morbid obesity (HCC) 01/10/2013  . Headache(784.0) 12/06/2012    Leibish Mcgregor P  Kallista Pae 08/08/2017, 9:15 AM  [facility] [address] [place], [place], [address] Phone: [phone]   Fax:  [phone]  Name: Megan Petersen MRN: [id number] Date of Birth: Nov 11, 1960

## 2017-08-11 ENCOUNTER — Ambulatory Visit (INDEPENDENT_AMBULATORY_CARE_PROVIDER_SITE_OTHER): Payer: BLUE CROSS/BLUE SHIELD | Admitting: Rehabilitative and Restorative Service Providers"

## 2017-08-11 ENCOUNTER — Encounter: Payer: Self-pay | Admitting: Rehabilitative and Restorative Service Providers"

## 2017-08-11 DIAGNOSIS — R600 Localized edema: Secondary | ICD-10-CM

## 2017-08-11 DIAGNOSIS — M25562 Pain in left knee: Secondary | ICD-10-CM

## 2017-08-11 DIAGNOSIS — M25662 Stiffness of left knee, not elsewhere classified: Secondary | ICD-10-CM

## 2017-08-11 DIAGNOSIS — M6281 Muscle weakness (generalized): Secondary | ICD-10-CM

## 2017-08-11 NOTE — Therapy (Signed)
[facility] [place] [address] [place], [place], [address] Phone: [phone]   Fax:  [phone]  Physical Therapy Treatment  Patient Details  Name: Megan Petersen MRN: [id number] Date of Birth: 26-Jul-1961 Referring Provider: Dr Leandra KernJeffery Beane   Encounter Date: 08/11/2017  PT End of Session - 08/11/17 0942    Visit Number  4    Number of Visits  18    Date for PT Re-Evaluation  08/29/17    PT Start Time  0938    PT Stop Time  1033    PT Time Calculation (min)  55 min    Activity Tolerance  Patient tolerated treatment well       Past Medical History:  Diagnosis Date  . Allergy   . Asthma   . Atypical glandular cells on Pap smear   . Esophageal reflux   . Family history of adverse reaction to anesthesia    brother slow to awaken  . GERD (gastroesophageal reflux disease)    Nexium controls  . Headache   . Hemorrhoids    history of- not a problem at present.  . Hyperlipidemia   . Hypertension   . Impaired fasting glucose    Dr. Jenny ReichmannZanard,PCP -told pt Hgb A1C level was good.  Marland Kitchen. LSIL (low grade squamous intraepithelial lesion) on Pap smear   . Morbid obesity (HCC)   . Osteoarthritis    Bilateral knees  . Osteoarthritis   . Pneumonia 2016  . PONV (postoperative nausea and vomiting)   . Pre-diabetes    hgA1c on 06-09-17 was 5.7  . Primary cough headache   . Rosacea   . Swelling of limb   . Transfusion history    '88- s/p childbirth  . Trigeminal neuralgia   . URI (upper respiratory infection)    cough with green sputum since 11-26-15  . Vertigo    10 yrs ago- only-    Past Surgical History:  Procedure Laterality Date  . CHOLECYSTECTOMY     history of gallstones  . LEEP    . right knee meniscus repair  2014  . SHOULDER OPEN ROTATOR CUFF REPAIR Right 12/01/2015   Procedure: RIGHT SHOULDER MINI OPEN ROTATOR CUFF REPAIR WITH RESECTON OF DISTAL CLAVICLE AND SUBACROMIAL DECOMPRESSION;  Surgeon: Jene EveryJeffrey Beane, MD;   Location[facility];  Service: Orthopedics;  Laterality: Right;  . TOTAL KNEE ARTHROPLASTY Right 06/13/2016   Procedure: RIGHT TOTAL KNEE ARTHROPLASTY;  Surgeon: Jene EveryJeffrey Beane, MD;  Location: [facility];  Service: Orthopedics;  Laterality: Right;  . TOTAL KNEE ARTHROPLASTY Left 07/16/2017   Procedure: LEFT TOTAL KNEE ARTHROPLASTY;  Surgeon: Jene EveryBeane, Jeffrey, MD;  Location[facility];  Service: Orthopedics;  Laterality: Left;  2.5 hrs    There were no vitals filed for this visit.  Subjective Assessment - 08/11/17 0944    Subjective  Patient reports that she has not had any more charlie horse in the Lt leg. She has been working on the exercises at home. Did get a stick to use for massaging tightness in the thigh and calf muscles. Managed to sleep about 5 hours last night. Can get over towards her stomach which is the position she always sleeps in.     Currently in Pain?  Yes    Pain Score  5     Pain Location  Knee    Pain Orientation  Left    Pain Descriptors / Indicators  Tightness    Pain Type  Surgical pain  Pain Onset  1 to 4 weeks ago    Pain Frequency  Constant         [facility] PT Assessment - 08/11/17 0001      Assessment   Medical Diagnosis  Lt TKA    Referring Provider  Dr Leandra Kern    Onset Date/Surgical Date  07/16/17    Hand Dominance  Right    Next MD Visit  08/27/17    Prior Therapy  not for this knee      AROM   Left Knee Extension  -9    Left Knee Flexion  88 AAROM with strap pt assist only       PROM   PROM Assessment Site  Knee    Right/Left Knee  Left    Left Knee Flexion  88 Prone knee flexion 90 deg                   [facility] Adult PT Treatment/Exercise - 08/11/17 0001      Knee/Hip Exercises: Aerobic   Nustep  L5 x 8 min       Knee/Hip Exercises: Standing   Hip Flexion  AROM;Left;2 sets;10 reps;Knee bent working on ROM - touching Lt foot on 8 inch step; then 12 in    Forward Lunges  -- Lt foot resting 12 inch step pushig fwd to stretch 30 secx5     Gait Training  ambulating laps around the gym between exercise - working on gait and       Knee/Hip Exercises: Seated   Other Seated Knee/Hip Exercises  sitting knee bent scooting forward hold 30 sec x 5       Knee/Hip Exercises: Supine   Heel Slides  AAROM;Left;10 reps    Straight Leg Raises  AROM;Strengthening;Left;10 reps      Knee/Hip Exercises: Sidelying   Hip ABduction  AROM;Strengthening;Left;15 reps leading with heel       Knee/Hip Exercises: Prone   Straight Leg Raises  Strengthening;Left;10 reps    Other Prone Exercises  assisted knee flexion with strap and PT assist 20-30 sec hold x 5 reps       Electrical Stimulation   Electrical Stimulation Location  Lt knee    Electrical Stimulation Action  Ion repelling     Electrical Stimulation Parameters  to tolerance     Electrical Stimulation Goals  Edema;Pain      Vasopneumatic   Number Minutes Vasopneumatic   15 minutes    Vasopnuematic Location   Knee Lt    Vasopneumatic Pressure  Low    Vasopneumatic Temperature   3*               PT Short Term Goals - 08/06/17 1015      PT SHORT TERM GOAL #1   Period  Days        PT Long Term Goals - 08/11/17 1610      PT LONG TERM GOAL #1   Title  Independent with advanced HEP ( 09/12/17)    Time  4    Period  Weeks    Status  On-going      PT LONG TERM GOAL #2   Title  improve Lt Knee ROM -4 extension to 118 flexion ( 1/198/19)      Time  4    Period  Weeks    Status  On-going      PT LONG TERM GOAL #3   Title  demo Lt LE strength =/> 5-/5 to  allow alternating gait on stairs ( 09/12/17)    Time  4    Period  Weeks    Status  On-going      PT LONG TERM GOAL #4   Title  improve gait pattern with minimal limp with improved gait pattern with walking ( 09/12/17)      Time  4    Period  Weeks    Status  On-going      PT LONG TERM GOAL #5   Title  Maintain FOTO =/< 52% limited, CK level  ( 09/12/17)      Time  4    Period  Weeks    Status  On-going               Patient will benefit from skilled therapeutic intervention in order to improve the following deficits and impairments:     Visit Diagnosis: Muscle weakness (generalized)  Localized edema  Stiffness of left knee, not elsewhere classified  Acute pain of left knee     Problem List Patient Active Problem List   Diagnosis Date Noted  . Primary osteoarthritis of left knee 07/16/2017  . Right knee DJD 06/13/2016  . Complete rotator cuff tear 12/01/2015  . Wheezing 04/07/2014  . Impaired fasting glucose 07/26/2013  . Essential hypertension, benign 07/26/2013  . Back pain 07/26/2013  . Knee pain, bilateral 01/10/2013  . Morbid obesity (HCC) 01/10/2013  . Headache(784.0) 12/06/2012    Willliam Pettet Rober MinionP Markell Sciascia PT, MPH 08/11/2017, 11:41 AM  [facility] [address] [place], [place], [address] Phone: [phone]   Fax:  [phone]  Name: Megan Petersen MRN: [id number] Date of Birth: [birth year]

## 2017-08-13 ENCOUNTER — Ambulatory Visit (INDEPENDENT_AMBULATORY_CARE_PROVIDER_SITE_OTHER): Payer: BLUE CROSS/BLUE SHIELD | Admitting: Physical Therapy

## 2017-08-13 ENCOUNTER — Encounter: Payer: Self-pay | Admitting: Physical Therapy

## 2017-08-13 DIAGNOSIS — M6281 Muscle weakness (generalized): Secondary | ICD-10-CM | POA: Diagnosis not present

## 2017-08-13 DIAGNOSIS — M25662 Stiffness of left knee, not elsewhere classified: Secondary | ICD-10-CM | POA: Diagnosis not present

## 2017-08-13 DIAGNOSIS — R600 Localized edema: Secondary | ICD-10-CM | POA: Diagnosis not present

## 2017-08-13 DIAGNOSIS — M25562 Pain in left knee: Secondary | ICD-10-CM | POA: Diagnosis not present

## 2017-08-13 NOTE — Therapy (Signed)
[facility] [place] [address] [place], [place], [address] Phone: [phone]   Fax:  [phone]  Physical Therapy Treatment  Patient Details  Name: Megan Petersen MRN: [id number] Date of Birth: 07-Mar-1961 Referring Provider: Dr Megan Petersen   Encounter Date: 08/13/2017  PT End of Session - 08/13/17 0937    Visit Number  5    Number of Visits  18    Date for PT Re-Evaluation  08/29/17    PT Start Time  0935    PT Stop Time  1029    PT Time Calculation (min)  54 min    Activity Tolerance  Patient tolerated treatment well       Past Medical History:  Diagnosis Date  . Allergy   . Asthma   . Atypical glandular cells on Pap smear   . Esophageal reflux   . Family history of adverse reaction to anesthesia    brother slow to awaken  . GERD (gastroesophageal reflux disease)    Nexium controls  . Headache   . Hemorrhoids    history of- not a problem at present.  . Hyperlipidemia   . Hypertension   . Impaired fasting glucose    Dr. Jenny Petersen -told pt Hgb A1C level was good.  Marland Kitchen LSIL (low grade squamous intraepithelial lesion) on Pap smear   . Morbid obesity (HCC)   . Osteoarthritis    Bilateral knees  . Osteoarthritis   . Pneumonia 2016  . PONV (postoperative nausea and vomiting)   . Pre-diabetes    hgA1c on 06-09-17 was 5.7  . Primary cough headache   . Rosacea   . Swelling of limb   . Transfusion history    '88- s/p childbirth  . Trigeminal neuralgia   . URI (upper respiratory infection)    cough with green sputum since 11-26-15  . Vertigo    10 yrs ago- only-    Past Surgical History:  Procedure Laterality Date  . CHOLECYSTECTOMY     history of gallstones  . LEEP    . right knee meniscus repair  2014  . SHOULDER OPEN ROTATOR CUFF REPAIR Right 12/01/2015   Procedure: RIGHT SHOULDER MINI OPEN ROTATOR CUFF REPAIR WITH RESECTON OF DISTAL CLAVICLE AND SUBACROMIAL DECOMPRESSION;  Surgeon: Megan Every, MD;   [facility];  Service: Orthopedics;  Laterality: Right;  . TOTAL KNEE ARTHROPLASTY Right 06/13/2016   Procedure: RIGHT TOTAL KNEE ARTHROPLASTY;  Surgeon: Megan Every, MD;  [facility];  Service: Orthopedics;  Laterality: Right;  . TOTAL KNEE ARTHROPLASTY Left 07/16/2017   Procedure: LEFT TOTAL KNEE ARTHROPLASTY;  Surgeon: Megan Every, MD;  [facility];  Service: Orthopedics;  Laterality: Left;  2.5 hrs    There were no vitals filed for this visit.  Subjective Assessment - 08/13/17 0938    Subjective  Pt is " here" HEP is difficult due to tight feeling in her knee, steri strips are off now.     Patient Stated Goals  bend the knee like her Rt     Currently in Pain?  Yes    Pain Score  5     Pain Location  Knee    Pain Orientation  Left    Pain Descriptors / Indicators  Tightness    Pain Type  Surgical pain    Pain Onset  More than a month ago    Pain Frequency  Constant    Aggravating Factors   bending the knee  Pain Relieving Factors  ice and meds         [facility] PT Assessment - 08/13/17 0001      Assessment   Medical Diagnosis  Lt TKA      AROM   AROM Assessment Site  Knee    Right/Left Knee  Left    Left Knee Extension  -8    Left Knee Flexion  97                  [facility] Adult PT Treatment/Exercise - 08/13/17 0001      Knee/Hip Exercises: Aerobic   Stationary Bike  L2x5' for ROM      Knee/Hip Exercises: Standing   Wall Squat  15 reps mini wall slides, VC for even WBing    SLS  Lt with Rt toe taps FWD, side, BWD, 2x10    Other Standing Knee Exercises  weight shifts BWD onto Lt LE       Knee/Hip Exercises: Seated   Heel Slides  AAROM;Left;10 reps ith scoots FWD      Knee/Hip Exercises: Supine   Short Arc Quad Sets  Strengthening;Left;2 sets;10 reps 5 sec holds      Modalities   Modalities  Management consultantlectrical Stimulation;Vasopneumatic      Electrical Stimulation   Electrical Stimulation Location  Lt knee    Electrical Stimulation Action   IFC    Electrical Stimulation Parameters   to tolerance    Electrical Stimulation Goals  Edema;Pain      Vasopneumatic   Number Minutes Vasopneumatic   15 minutes    Vasopnuematic Location   Knee    Vasopneumatic Pressure  Medium    Vasopneumatic Temperature   3*               PT Short Term Goals - 08/06/17 1015      PT SHORT TERM GOAL #1   Period  Days        PT Long Term Goals - 08/13/17 0941      PT LONG TERM GOAL #1   Title  Independent with advanced HEP ( 09/12/17)    Status  On-going      PT LONG TERM GOAL #2   Title  improve Lt Knee ROM -4 extension to 118 flexion ( 1/198/19)      Status  On-going      PT LONG TERM GOAL #3   Title  demo Lt LE strength =/> 5-/5 to allow alternating gait on stairs ( 09/12/17)    Status  On-going      PT LONG TERM GOAL #4   Title  improve gait pattern with minimal limp with improved gait pattern with walking ( 09/12/17)      Status  On-going      PT LONG TERM GOAL #5   Title  Maintain FOTO =/< 52% limited, CK level  ( 09/12/17)      Status  On-going            Plan - 08/13/17 1010    Clinical Impression Statement  [name] continues with a lot of tightness in her Lt knee with edema.  She is able to tolerate a little more weightbearing on the LE , ROM is improving.     Rehab Potential  Excellent    PT Frequency  3x / week    PT Duration  6 weeks    PT Treatment/Interventions  Gait training;Neuromuscular re-education;Manual techniques;Functional mobility training;Moist Heat;Ultrasound;Therapeutic activities;Patient/family education;Taping;Vasopneumatic Device;Therapeutic exercise;Cryotherapy;Electrical Stimulation;Passive range of  motion    PT Next Visit Plan  gait training with [name], work on knee extension    Consulted and Agree with Plan of Care  Patient       Patient will benefit from skilled therapeutic intervention in order to improve the following deficits and impairments:  Abnormal gait, Pain, Decreased mobility,  Decreased scar mobility, Increased muscle spasms, Decreased strength, Decreased range of motion, Decreased endurance, Decreased balance, Difficulty walking, Increased edema  Visit Diagnosis: Muscle weakness (generalized)  Localized edema  Stiffness of left knee, not elsewhere classified  Acute pain of left knee     Problem List Patient Active Problem List   Diagnosis Date Noted  . Primary osteoarthritis of left knee 07/16/2017  . Right knee DJD 06/13/2016  . Complete rotator cuff tear 12/01/2015  . Wheezing 04/07/2014  . Impaired fasting glucose 07/26/2013  . Essential hypertension, benign 07/26/2013  . Back pain 07/26/2013  . Knee pain, bilateral 01/10/2013  . Morbid obesity (HCC) 01/10/2013  . Headache(784.0) 12/06/2012    Roderic ScarceSusan Mandy Peeks PT  08/13/2017, 10:14 AM  [facility] [address] [place], [place], [address] Phone: [phone]   Fax:  [phone]  Name: Megan Petersen MRN: [id number] Date of Birth: 03-26-1961

## 2017-08-15 ENCOUNTER — Ambulatory Visit (INDEPENDENT_AMBULATORY_CARE_PROVIDER_SITE_OTHER): Payer: BLUE CROSS/BLUE SHIELD | Admitting: Physical Therapy

## 2017-08-15 ENCOUNTER — Encounter: Payer: Self-pay | Admitting: Physical Therapy

## 2017-08-15 DIAGNOSIS — M25562 Pain in left knee: Secondary | ICD-10-CM

## 2017-08-15 DIAGNOSIS — M6281 Muscle weakness (generalized): Secondary | ICD-10-CM

## 2017-08-15 DIAGNOSIS — M25662 Stiffness of left knee, not elsewhere classified: Secondary | ICD-10-CM | POA: Diagnosis not present

## 2017-08-15 DIAGNOSIS — R600 Localized edema: Secondary | ICD-10-CM | POA: Diagnosis not present

## 2017-08-15 NOTE — Therapy (Signed)
[facility] [place] [address] [place], [place], [address] Phone: [phone]   Fax:  [phone]  Physical Therapy Treatment  Patient Details  Name: Megan Petersen MRN: [id number] Date of Birth: [birth year] Referring Provider: Dr Leandra Kern   Encounter Date: 08/15/2017  PT End of Session - 08/15/17 0930    Visit Number  6    Number of Visits  18    Date for PT Re-Evaluation  08/29/17    PT Start Time  0930    PT Stop Time  1023    PT Time Calculation (min)  53 min    Activity Tolerance  Patient tolerated treatment well       Past Medical History:  Diagnosis Date  . Allergy   . Asthma   . Atypical glandular cells on Pap smear   . Esophageal reflux   . Family history of adverse reaction to anesthesia    brother slow to awaken  . GERD (gastroesophageal reflux disease)    Nexium controls  . Headache   . Hemorrhoids    history of- not a problem at present.  . Hyperlipidemia   . Hypertension   . Impaired fasting glucose    Dr. Jenny Reichmann -told pt Hgb A1C level was good.  Marland Kitchen LSIL (low grade squamous intraepithelial lesion) on Pap smear   . Morbid obesity (HCC)   . Osteoarthritis    Bilateral knees  . Osteoarthritis   . Pneumonia 2016  . PONV (postoperative nausea and vomiting)   . Pre-diabetes    hgA1c on 06-09-17 was 5.7  . Primary cough headache   . Rosacea   . Swelling of limb   . Transfusion history    '88- s/p childbirth  . Trigeminal neuralgia   . URI (upper respiratory infection)    cough with green sputum since 11-26-15  . Vertigo    10 yrs ago- only-    Past Surgical History:  Procedure Laterality Date  . CHOLECYSTECTOMY     history of gallstones  . LEEP    . right knee meniscus repair  2014  . SHOULDER OPEN ROTATOR CUFF REPAIR Right 12/01/2015   Procedure: RIGHT SHOULDER MINI OPEN ROTATOR CUFF REPAIR WITH RESECTON OF DISTAL CLAVICLE AND SUBACROMIAL DECOMPRESSION;  Surgeon: Jene Every, MD;   Location: [facility];  Service: Orthopedics;  Laterality: Right;  . TOTAL KNEE ARTHROPLASTY Right 06/13/2016   Procedure: RIGHT TOTAL KNEE ARTHROPLASTY;  Surgeon: Jene Every, MD;  Location: [facility];  Service: Orthopedics;  Laterality: Right;  . TOTAL KNEE ARTHROPLASTY Left 07/16/2017   Procedure: LEFT TOTAL KNEE ARTHROPLASTY;  Surgeon: Jene Every, MD;  Location: [facility];  Service: Orthopedics;  Laterality: Left;  2.5 hrs    There were no vitals filed for this visit.  Subjective Assessment - 08/15/17 0931    Subjective  Harbour reports the knee is more sore today.     Currently in Pain?  Yes    Pain Score  4     Pain Location  Knee    Pain Orientation  Left superior     Pain Descriptors / Indicators  Aching    Pain Type  Surgical pain    Pain Onset  More than a month ago    Pain Frequency  Constant    Aggravating Factors   bending the knee    Pain Relieving Factors  ice and meds  [facility] Adult PT Treatment/Exercise - 08/15/17 0001      Ambulation/Gait   Ambulation/Gait  Yes    Ambulation/Gait Assistance  -- practiced with cane in the clinic    Assistive device  Straight cane    Gait Pattern  -- verbal cues to maintain upright posture.       Knee/Hip Exercises: Aerobic   Stationary Bike  for ROM x 5' able to make full revolutions      Knee/Hip Exercises: Standing   Lateral Step Up  Left;2 sets;10 reps;Step Height: 4"    Forward Step Up  Left;2 sets;Step Height: 4";20 reps    SLS  Lt with Rt toe taps FWD, side, BWD, 2x10      Knee/Hip Exercises: Seated   Long Arc Quad  Strengthening;Left;20 reps;Weights    Long Arc Quad Weight  3 lbs.      Knee/Hip Exercises: Supine   Bridges  Both;20 reps    Other Supine Knee/Hip Exercises  30reps Lt DF with green band      Modalities   Modalities  Electrical Stimulation;Vasopneumatic      Programme researcher, broadcasting/film/videolectrical Stimulation   Electrical Stimulation Location  Lt knee    Electrical Stimulation Action  ion repelling     Electrical Stimulation Parameters  to tolerance    Electrical Stimulation Goals  Edema;Pain      Vasopneumatic   Number Minutes Vasopneumatic   15 minutes    Vasopnuematic Location   Knee    Vasopneumatic Pressure  Medium    Vasopneumatic Temperature   3*               PT Short Term Goals - 08/06/17 1015      PT SHORT TERM GOAL #1   Period  Days        PT Long Term Goals - 08/13/17 0941      PT LONG TERM GOAL #1   Title  Independent with advanced HEP ( 09/12/17)    Status  On-going      PT LONG TERM GOAL #2   Title  improve Lt Knee ROM -4 extension to 118 flexion ( 1/198/19)      Status  On-going      PT LONG TERM GOAL #3   Title  demo Lt LE strength =/> 5-/5 to allow alternating gait on stairs ( 09/12/17)    Status  On-going      PT LONG TERM GOAL #4   Title  improve gait pattern with minimal limp with improved gait pattern with walking ( 09/12/17)      Status  On-going      PT LONG TERM GOAL #5   Title  Maintain FOTO =/< 52% limited, CK level  ( 09/12/17)      Status  On-going            Plan - 08/15/17 1009    Clinical Impression Statement  [name] continues to improve, did have some cramping in her hamstrings with bridges.  Knee extension is slowly improving. She did well with  ambulation, recommend she stop walker and use cane.  Continue with post op TKA exercise     Rehab Potential  Excellent    PT Frequency  3x / week    PT Duration  6 weeks    PT Treatment/Interventions  Gait training;Neuromuscular re-education;Manual techniques;Functional mobility training;Moist Heat;Ultrasound;Therapeutic activities;Patient/family education;Taping;Vasopneumatic Device;Therapeutic exercise;Cryotherapy;Electrical Stimulation;Passive range of motion    PT Next Visit Plan  TKA rehab    Consulted and Agree  with Plan of Care  Patient       Patient will benefit from skilled therapeutic intervention in order to improve the following deficits and impairments:   Abnormal gait, Pain, Decreased mobility, Decreased scar mobility, Increased muscle spasms, Decreased strength, Decreased range of motion, Decreased endurance, Decreased balance, Difficulty walking, Increased edema  Visit Diagnosis: Muscle weakness (generalized)  Localized edema  Stiffness of left knee, not elsewhere classified  Acute pain of left knee     Problem List Patient Active Problem List   Diagnosis Date Noted  . Primary osteoarthritis of left knee 07/16/2017  . Right knee DJD 06/13/2016  . Complete rotator cuff tear 12/01/2015  . Wheezing 04/07/2014  . Impaired fasting glucose 07/26/2013  . Essential hypertension, benign 07/26/2013  . Back pain 07/26/2013  . Knee pain, bilateral 01/10/2013  . Morbid obesity (HCC) 01/10/2013  . Headache(784.0) 12/06/2012    Roderic ScarceSusan Lakoda Mcanany PT  08/15/2017, 10:12 AM  [facility] [address] [place] [address] [place], [place], [address] Phone: [phone]   Fax:  [phone]  Name: Rinaldo RatelRhonda R Odwyer MRN: [id number] Date of Birth: 07-20-1961

## 2017-08-20 ENCOUNTER — Encounter: Payer: Self-pay | Admitting: Physical Therapy

## 2017-08-21 ENCOUNTER — Encounter: Payer: Self-pay | Admitting: Rehabilitative and Restorative Service Providers"

## 2017-08-21 ENCOUNTER — Ambulatory Visit (INDEPENDENT_AMBULATORY_CARE_PROVIDER_SITE_OTHER): Payer: BLUE CROSS/BLUE SHIELD | Admitting: Rehabilitative and Restorative Service Providers"

## 2017-08-21 DIAGNOSIS — M25562 Pain in left knee: Secondary | ICD-10-CM | POA: Diagnosis not present

## 2017-08-21 DIAGNOSIS — M6281 Muscle weakness (generalized): Secondary | ICD-10-CM | POA: Diagnosis not present

## 2017-08-21 DIAGNOSIS — R600 Localized edema: Secondary | ICD-10-CM

## 2017-08-21 DIAGNOSIS — M25662 Stiffness of left knee, not elsewhere classified: Secondary | ICD-10-CM | POA: Diagnosis not present

## 2017-08-21 NOTE — Therapy (Addendum)
[facility] [place] [address] [place] [address] [place], [place], [address] Phone: [phone]   Fax:  [phone]  Physical Therapy Treatment  Patient Details  Name: Megan Petersen MRN: [id number] Date of Birth: [birth year] Referring Provider: Dr Megan Petersen   Encounter Date: 08/21/2017  PT End of Session - 08/21/17 1023    Visit Number  7    Number of Visits  18    Date for PT Re-Evaluation  08/29/17    PT Start Time  1017    PT Stop Time  1121    PT Time Calculation (min)  64 min    Activity Tolerance  Patient tolerated treatment well       Past Medical History:  Diagnosis Date  . Allergy   . Asthma   . Atypical glandular cells on Pap smear   . Esophageal reflux   . Family history of adverse reaction to anesthesia    brother slow to awaken  . GERD (gastroesophageal reflux disease)    Nexium controls  . Headache   . Hemorrhoids    history of- not a problem at present.  . Hyperlipidemia   . Hypertension   . Impaired fasting glucose    Dr. Jenny Petersen,PCP -told pt Hgb A1C level was good.  Marland Kitchen. LSIL (low grade squamous intraepithelial lesion) on Pap smear   . Morbid obesity (HCC)   . Osteoarthritis    Bilateral knees  . Osteoarthritis   . Pneumonia 2016  . PONV (postoperative nausea and vomiting)   . Pre-diabetes    hgA1c on 06-09-17 was 5.7  . Primary cough headache   . Rosacea   . Swelling of limb   . Transfusion history    '88- s/p childbirth  . Trigeminal neuralgia   . URI (upper respiratory infection)    cough with green sputum since 11-26-15  . Vertigo    10 yrs ago- only-    Past Surgical History:  Procedure Laterality Date  . CHOLECYSTECTOMY     history of gallstones  . LEEP    . right knee meniscus repair  2014  . SHOULDER OPEN ROTATOR CUFF REPAIR Right 12/01/2015   Procedure: RIGHT SHOULDER MINI OPEN ROTATOR CUFF REPAIR WITH RESECTON OF DISTAL CLAVICLE AND SUBACROMIAL DECOMPRESSION;  Surgeon: Megan EveryJeffrey Beane, MD;   [facility];  Service: Orthopedics;  Laterality: Right;  . TOTAL KNEE ARTHROPLASTY Right 06/13/2016   Procedure: RIGHT TOTAL KNEE ARTHROPLASTY;  Surgeon: Megan EveryJeffrey Beane, MD;  Location: [facility];  Service: Orthopedics;  Laterality: Right;  . TOTAL KNEE ARTHROPLASTY Left 07/16/2017   Procedure: LEFT TOTAL KNEE ARTHROPLASTY;  Surgeon: Megan EveryBeane, Jeffrey, MD;  Location: [facility];  Service: Orthopedics;  Laterality: Left;  2.5 hrs    There were no vitals filed for this visit.  Subjective Assessment - 08/21/17 1024    Subjective  Knee is still swelling - slept in her TED hose last night and that seemed to help.     Pertinent History  Rt TKA 05/2016, MRI Rt foot due to pain - has multiple things wrong with it and seeing MD for this.  Pt reports it stays swollen.     Patient Stated Goals  bend the knee like her Rt     Currently in Pain?  Yes    Pain Score  5     Pain Location  Knee    Pain Orientation  Left    Pain Descriptors / Indicators  Aching    Pain Type  Surgical  pain    Pain Onset  More than a month ago    Pain Frequency  Intermittent    Aggravating Factors   bending knee; getting up after having been sitting     Pain Relieving Factors  ice and meds          [facility] PT Assessment - 08/21/17 0001      Assessment   Medical Diagnosis  Lt TKA    Referring Provider  Dr Megan Petersen    Onset Date/Surgical Date  07/16/17    Hand Dominance  Right    Next MD Visit  08/27/17      Observation/Other Assessments   Focus on Therapeutic Outcomes (FOTO)   43% limitation       AROM   AROM Assessment Site  Knee    Right/Left Knee  Left    Left Knee Extension  -5    Left Knee Flexion  101      Strength   Left Hip Flexion  4+/5    Left Hip ABduction  4/5      Palpation   Patella mobility  hypomobile    Palpation comment  muscular tightness through quads especially distal lateral quad; tightness through area of scar                   [facility] Adult PT Treatment/Exercise - 08/21/17  0001      Knee/Hip Exercises: Stretches   Gastroc Stretch  Left;3 reps;30 seconds      Knee/Hip Exercises: Aerobic   Stationary Bike  for ROM x 7' able to make full revolutions      Knee/Hip Exercises: Standing   Knee Flexion  AROM;Right;Left;20 reps alternating tap to 12 inch step     Hip Abduction  AROM;Left;Right;2 sets;10 reps    Lateral Step Up  Left;2 sets;10 reps;Step Height: 4"    Forward Step Up  Left;2 sets;Step Height: 4";10 reps    Wall Squat  15 reps    SLS  Lt with Rt toe taps; Rt with Lt toe taps alternating FWD, side, BWD, 2x10      Knee/Hip Exercises: Seated   Heel Slides  AAROM;Left;10 reps bending knee scooting fwd/bending fwd       Knee/Hip Exercises: Supine   Other Supine Knee/Hip Exercises  hip abd with green TB holding one LE and moving opposite LE - 2 sets of 10       Knee/Hip Exercises: Sidelying   Hip ABduction  AROM;Strengthening;Left;20 reps      Electrical Stimulation   Electrical Stimulation Location  Lt knee    Electrical Stimulation Action  ion repelling     Electrical Stimulation Parameters  to tolerance    Electrical Stimulation Goals  Edema;Pain      Vasopneumatic   Number Minutes Vasopneumatic   15 minutes    Vasopnuematic Location   Knee    Vasopneumatic Pressure  Medium    Vasopneumatic Temperature   3*      Manual Therapy   Kinesiotex  Edema basket weave pattern Lt knee to decreased edema                PT Short Term Goals - 08/06/17 1015      PT SHORT TERM GOAL #1   Period  Days        PT Long Term Goals - 08/21/17 1027      PT LONG TERM GOAL #1   Title  Independent with advanced HEP ( 09/12/17)  Time  4    Period  Weeks    Status  On-going      PT LONG TERM GOAL #2   Title  improve Lt Knee ROM -4 extension to 118 flexion ( 1/198/19)      Time  4    Period  Weeks    Status  On-going      PT LONG TERM GOAL #3   Title  demo Lt LE strength =/> 5-/5 to allow alternating gait on stairs ( 09/12/17)    Time  4     Period  Weeks    Status  On-going      PT LONG TERM GOAL #4   Title  improve gait pattern with minimal limp with improved gait pattern with walking ( 09/12/17)      Time  4    Period  Weeks    Status  On-going      PT LONG TERM GOAL #5   Title  Maintain FOTO =/< 52% limited, CK level  ( 09/12/17)      Time  4    Period  Weeks    Status  Achieved            Plan - 08/21/17 1108    Clinical Impression Statement  Danilynn continues to improve with increasing strength and ROM. Gait is improving with patient walking with single point cane with some continued limp. She has continued edema and report of "tightness and swelling" in the Lt knee. Aicia is progressing well toward stated goals of therapy.     Rehab Potential  Excellent    PT Frequency  3x / week    PT Duration  6 weeks    PT Treatment/Interventions  Gait training;Neuromuscular re-education;Manual techniques;Functional mobility training;Moist Heat;Ultrasound;Therapeutic activities;Patient/family education;Taping;Vasopneumatic Device;Therapeutic exercise;Cryotherapy;Electrical Stimulation;Passive range of motion    PT Next Visit Plan  TKA rehab    Consulted and Agree with Plan of Care  Patient       Patient will benefit from skilled therapeutic intervention in order to improve the following deficits and impairments:  Abnormal gait, Pain, Decreased mobility, Decreased scar mobility, Increased muscle spasms, Decreased strength, Decreased range of motion, Decreased endurance, Decreased balance, Difficulty walking, Increased edema  Visit Diagnosis: Muscle weakness (generalized)  Localized edema  Stiffness of left knee, not elsewhere classified  Acute pain of left knee     Problem List Patient Active Problem List   Diagnosis Date Noted  . Primary osteoarthritis of left knee 07/16/2017  . Right knee DJD 06/13/2016  . Complete rotator cuff tear 12/01/2015  . Wheezing 04/07/2014  . Impaired fasting glucose 07/26/2013   . Essential hypertension, benign 07/26/2013  . Back pain 07/26/2013  . Knee pain, bilateral 01/10/2013  . Morbid obesity (HCC) 01/10/2013  . Headache(784.0) 12/06/2012    Bowie Delia Rober Minion PT, MPH  08/21/2017, 11:31 AM  [facility] [address] [place] [address] [place], [place], [address] Phone: [phone]   Fax:  [phone]  Name: Megan Petersen MRN: [id number] Date of Birth: 21-Dec-1960

## 2017-08-27 ENCOUNTER — Encounter: Payer: Self-pay | Admitting: Physical Therapy

## 2017-08-28 ENCOUNTER — Encounter: Payer: Self-pay | Admitting: Rehabilitative and Restorative Service Providers"

## 2017-08-29 ENCOUNTER — Encounter: Payer: Self-pay | Admitting: Rehabilitative and Restorative Service Providers"

## 2017-08-29 ENCOUNTER — Ambulatory Visit (INDEPENDENT_AMBULATORY_CARE_PROVIDER_SITE_OTHER): Payer: BLUE CROSS/BLUE SHIELD | Admitting: Rehabilitative and Restorative Service Providers"

## 2017-08-29 DIAGNOSIS — M25662 Stiffness of left knee, not elsewhere classified: Secondary | ICD-10-CM | POA: Diagnosis not present

## 2017-08-29 DIAGNOSIS — M6281 Muscle weakness (generalized): Secondary | ICD-10-CM

## 2017-08-29 DIAGNOSIS — M25562 Pain in left knee: Secondary | ICD-10-CM | POA: Diagnosis not present

## 2017-08-29 DIAGNOSIS — R600 Localized edema: Secondary | ICD-10-CM

## 2017-08-29 DIAGNOSIS — R2689 Other abnormalities of gait and mobility: Secondary | ICD-10-CM | POA: Diagnosis not present

## 2017-08-29 NOTE — Therapy (Signed)
[facility] [place] [address] [place] [address] [place], [place], [address] Phone: [phone]   Fax:  [phone]  Physical Therapy Treatment  Patient Details  Name: Megan Petersen MRN: [id number] Date of Birth: 1961/03/07 Referring Provider: Dr Leandra Kern   Encounter Date: 08/29/2017  PT End of Session - 08/29/17 0952    Visit Number  8    Number of Visits  18    Date for PT Re-Evaluation  10/10/17    PT Start Time  0934    PT Stop Time  1030    PT Time Calculation (min)  56 min    Activity Tolerance  Patient tolerated treatment well       Past Medical History:  Diagnosis Date  . Allergy   . Asthma   . Atypical glandular cells on Pap smear   . Esophageal reflux   . Family history of adverse reaction to anesthesia    brother slow to awaken  . GERD (gastroesophageal reflux disease)    Nexium controls  . Headache   . Hemorrhoids    history of- not a problem at present.  . Hyperlipidemia   . Hypertension   . Impaired fasting glucose    Dr. Jenny Reichmann -told pt Hgb A1C level was good.  Marland Kitchen LSIL (low grade squamous intraepithelial lesion) on Pap smear   . Morbid obesity (HCC)   . Osteoarthritis    Bilateral knees  . Osteoarthritis   . Pneumonia 2016  . PONV (postoperative nausea and vomiting)   . Pre-diabetes    hgA1c on 06-09-17 was 5.7  . Primary cough headache   . Rosacea   . Swelling of limb   . Transfusion history    '88- s/p childbirth  . Trigeminal neuralgia   . URI (upper respiratory infection)    cough with green sputum since 11-26-15  . Vertigo    10 yrs ago- only-    Past Surgical History:  Procedure Laterality Date  . CHOLECYSTECTOMY     history of gallstones  . LEEP    . right knee meniscus repair  2014  . SHOULDER OPEN ROTATOR CUFF REPAIR Right 12/01/2015   Procedure: RIGHT SHOULDER MINI OPEN ROTATOR CUFF REPAIR WITH RESECTON OF DISTAL CLAVICLE AND SUBACROMIAL DECOMPRESSION;  Surgeon: Jene Every, MD;   [facility];  Service: Orthopedics;  Laterality: Right;  . TOTAL KNEE ARTHROPLASTY Right 06/13/2016   Procedure: RIGHT TOTAL KNEE ARTHROPLASTY;  Surgeon: Jene Every, MD;  Location: [facility];  Service: Orthopedics;  Laterality: Right;  . TOTAL KNEE ARTHROPLASTY Left 07/16/2017   Procedure: LEFT TOTAL KNEE ARTHROPLASTY;  Surgeon: Jene Every, MD;  [facility]: [facility];  Service: Orthopedics;  Laterality: Left;  2.5 hrs    There were no vitals filed for this visit.  Subjective Assessment - 08/29/17 1000    Subjective  Irritated knee Wednesday getting in the car and has had increased pain in the lateral Lt knee and distal anterior knee/tibial area. Saw MD prior to this incident on Wednesday and he was pleased with progress.     Patient Stated Goals  bend the knee like her Rt     Currently in Pain?  Yes    Pain Score  6     Pain Location  Knee    Pain Orientation  Left    Pain Descriptors / Indicators  Aching;Tightness;Stabbing    Pain Type  Surgical pain    Pain Onset  More than a month ago  Pain Frequency  Intermittent         [facility] PT Assessment - 08/29/17 0001      Assessment   Medical Diagnosis  Lt TKA    Referring Provider  Dr Leandra KernJeffery Beane    Onset Date/Surgical Date  07/16/17    Hand Dominance  Right    Next MD Visit  10/08/17      AROM   AROM Assessment Site  Knee    Right/Left Knee  Left    Left Knee Extension  -8    Left Knee Flexion  100      Strength   Left Hip Flexion  4+/5    Left Hip ABduction  4/5      Palpation   Patella mobility  hypomobile    Palpation comment  muscular tightness through quads especially distal lateral quad; tightness through area of scar                   [facility] Adult PT Treatment/Exercise - 08/29/17 0001      Knee/Hip Exercises: Stretches   Active Hamstring Stretch  -- seated HS stretch Lt LE extended 30 sec x 3     Passive Hamstring Stretch  Left;3 reps;30 seconds supine with strap    ITB Stretch  Left;3 reps;30  seconds      Knee/Hip Exercises: Aerobic   Nustep  L5 x 10 min       Knee/Hip Exercises: Standing   Knee Flexion  AROM;Left;20 reps tapping Lt foot to ~ 4 inch step       Knee/Hip Exercises: Seated   Heel Slides  AAROM;Left;10 reps bending knee scooting fwd/bending fwd       Knee/Hip Exercises: Supine   Quad Sets  Strengthening;Left;1 set;10 reps      Programme researcher, broadcasting/film/videolectrical Stimulation   Electrical Stimulation Location  Lt knee    Technical sales engineerlectrical Stimulation Action  ion repelling    Electrical Stimulation Parameters  to tolerance    Electrical Stimulation Goals  Edema;Pain      Vasopneumatic   Number Minutes Vasopneumatic   15 minutes    Vasopnuematic Location   Knee    Vasopneumatic Pressure  Medium    Vasopneumatic Temperature   3*      Manual Therapy   Kinesiotex  Edema basket weave pattern Lt knee to decreased edema                PT Short Term Goals - 08/06/17 1015      PT SHORT TERM GOAL #1   Period  Days        PT Long Term Goals - 08/29/17 [id number]      PT LONG TERM GOAL #1   Title  Independent with advanced HEP (10/10/17)    Time  10    Period  Weeks    Status  Revised      PT LONG TERM GOAL #2   Title  improve Lt Knee ROM -4 extension to 118 flexion ( 10/10/17)      Time  10    Period  Weeks    Status  Revised      PT LONG TERM GOAL #3   Title  demo Lt LE strength =/> 5-/5 to allow alternating gait on stairs (10/10/17)    Time  10    Period  Weeks    Status  Revised      PT LONG TERM GOAL #4   Title  improve gait pattern with minimal limp  with improved gait pattern with walking (10/10/17)      Time  10    Period  Weeks    Status  Revised      PT LONG TERM GOAL #5   Title  Maintain FOTO =/< 52% limited, CK level  ( 09/12/17)      Time  4    Period  Weeks    Status  Achieved            Plan - 08/29/17 0955    Clinical Impression Statement  Akela presents today with c/o irritation of Lt LE - she had increased tightness through the Lt hamstrings  with pain radiating into the distal knee. She had decreased pain with treatment. She will continue with ice, elevation and focus on HS stretching for the next 2-3 days.     Rehab Potential  Excellent    PT Frequency  2x / week    PT Duration  12 weeks    PT Treatment/Interventions  Gait training;Neuromuscular re-education;Manual techniques;Functional mobility training;Moist Heat;Ultrasound;Therapeutic activities;Patient/family education;Taping;Vasopneumatic Device;Therapeutic exercise;Cryotherapy;Electrical Stimulation;Passive range of motion    PT Next Visit Plan  TKA rehab    Consulted and Agree with Plan of Care  Patient       Patient will benefit from skilled therapeutic intervention in order to improve the following deficits and impairments:  Abnormal gait, Pain, Decreased mobility, Decreased scar mobility, Increased muscle spasms, Decreased strength, Decreased range of motion, Decreased endurance, Decreased balance, Difficulty walking, Increased edema  Visit Diagnosis: Muscle weakness (generalized) - Plan: PT plan of care cert/re-cert  Localized edema - Plan: PT plan of care cert/re-cert  Stiffness of left knee, not elsewhere classified - Plan: PT plan of care cert/re-cert  Acute pain of left knee - Plan: PT plan of care cert/re-cert  Other abnormalities of gait and mobility - Plan: PT plan of care cert/re-cert     Problem List Patient Active Problem List   Diagnosis Date Noted  . Primary osteoarthritis of left knee 07/16/2017  . Right knee DJD 06/13/2016  . Complete rotator cuff tear 12/01/2015  . Wheezing 04/07/2014  . Impaired fasting glucose 07/26/2013  . Essential hypertension, benign 07/26/2013  . Back pain 07/26/2013  . Knee pain, bilateral 01/10/2013  . Morbid obesity (HCC) 01/10/2013  . Headache(784.0) 12/06/2012    Sarye Kath Rober Minion PT, MPH  08/29/2017, 10:24 AM  [facility] [address] [place] [address] [place],  [place], [address] Phone: [phone]   Fax:  [phone]  Name: TARNISHA KACHMAR MRN: [id number] Date of Birth: Sep 16, 1960

## 2017-09-01 ENCOUNTER — Encounter: Payer: Self-pay | Admitting: Physical Therapy

## 2017-09-01 ENCOUNTER — Ambulatory Visit (INDEPENDENT_AMBULATORY_CARE_PROVIDER_SITE_OTHER): Payer: BLUE CROSS/BLUE SHIELD | Admitting: Physical Therapy

## 2017-09-01 DIAGNOSIS — M25562 Pain in left knee: Secondary | ICD-10-CM | POA: Diagnosis not present

## 2017-09-01 DIAGNOSIS — R600 Localized edema: Secondary | ICD-10-CM

## 2017-09-01 DIAGNOSIS — R2689 Other abnormalities of gait and mobility: Secondary | ICD-10-CM | POA: Diagnosis not present

## 2017-09-01 DIAGNOSIS — M6281 Muscle weakness (generalized): Secondary | ICD-10-CM | POA: Diagnosis not present

## 2017-09-01 DIAGNOSIS — M25662 Stiffness of left knee, not elsewhere classified: Secondary | ICD-10-CM

## 2017-09-01 NOTE — Therapy (Signed)
[place], [place], [address] Phone: [phone]   Fax:  [phone]  Physical Therapy Treatment  Patient Details  Name: Megan Petersen MRN: [id number] Date of Birth: 1960/12/02 Referring Provider: Dr Donnald Garre   Encounter Date: 09/01/2017  PT End of Session - 09/01/17 0932    Visit Number  9    Number of Visits  18    Date for PT Re-Evaluation  10/10/17    PT Start Time  0932    PT Stop Time  1026    PT Time Calculation (min)  54 min    Activity Tolerance  Patient tolerated treatment well       Past Medical History:  Diagnosis Date  . Allergy   . Asthma   . Atypical glandular cells on Pap smear   . Esophageal reflux   . Family history of adverse reaction to anesthesia    brother slow to awaken  . GERD (gastroesophageal reflux disease)    Nexium controls  . Headache   . Hemorrhoids    history of- not a problem at present.  . Hyperlipidemia   . Hypertension   . Impaired fasting glucose    Dr. Darcella Cheshire -told pt Hgb A1C level was good.  Marland Kitchen LSIL (low grade squamous intraepithelial lesion) on Pap smear   . Morbid obesity ([place])   . Osteoarthritis    Bilateral knees  . Osteoarthritis   . Pneumonia 2016  . PONV (postoperative nausea and vomiting)   . Pre-diabetes    hgA1c on 06-09-17 was 5.7  . Primary cough headache   . Rosacea   . Swelling of limb   . Transfusion history    '88- s/p childbirth  . Trigeminal neuralgia   . URI (upper respiratory infection)    cough with green sputum since 11-26-15  . Vertigo    10 yrs ago- only-    Past Surgical History:  Procedure Laterality Date  . CHOLECYSTECTOMY     history of gallstones  . LEEP    . right knee meniscus repair  2014  . SHOULDER OPEN ROTATOR CUFF REPAIR Right 12/01/2015   Procedure: RIGHT SHOULDER MINI OPEN ROTATOR CUFF REPAIR WITH RESECTON OF DISTAL CLAVICLE AND SUBACROMIAL DECOMPRESSION;  Surgeon: Susa Day, MD;   Location: [facility];  Service: Orthopedics;  Laterality: Right;  . TOTAL KNEE ARTHROPLASTY Right 06/13/2016   Procedure: RIGHT TOTAL KNEE ARTHROPLASTY;  Surgeon: Susa Day, MD;  Location: [facility];  Service: Orthopedics;  Laterality: Right;  . TOTAL KNEE ARTHROPLASTY Left 07/16/2017   Procedure: LEFT TOTAL KNEE ARTHROPLASTY;  Surgeon: Susa Day, MD;  Location: [facility];  Service: Orthopedics;  Laterality: Left;  2.5 hrs    There were no vitals filed for this visit.  Subjective Assessment - 09/01/17 0938    Subjective  Megan Petersen reports the knee is better today than last visit. Sleeping better.     Patient Stated Goals  bend the knee like her Rt     Currently in Pain?  Yes    Pain Score  3     Pain Location  Knee    Pain Orientation  Left    Pain Descriptors / Indicators  Aching;Dull    Pain Type  Surgical pain    Pain Onset  More than a month ago    Pain Frequency  Intermittent    Aggravating Factors   bending the knee    Pain Relieving Factors  ice and meds  [facility] PT Assessment - 09/01/17 0001      Assessment   Medical Diagnosis  Lt TKA      AROM   AROM Assessment Site  Knee    Right/Left Knee  Left    Left Knee Flexion  107      PROM   Left Knee Flexion  109                  [facility] Adult PT Treatment/Exercise - 09/01/17 0001      Knee/Hip Exercises: Aerobic   Stationary Bike  for ROM x3', then 4' L1      Knee/Hip Exercises: Standing   Forward Step Up  Left 30 reps for ROM, foot on 13" step with lunge to stretch    Wall Squat  20 reps    SLS  3x30 sec attempts Lt LE with HHA PRN       Knee/Hip Exercises: Seated   Heel Slides  AAROM;Left;10 reps 10 sec scoots to bend knee      Knee/Hip Exercises: Supine   Quad Sets  Strengthening;Left;10 reps 10 sec holds    Heel Slides  AROM;10 reps               PT Short Term Goals - 08/06/17 1015      PT SHORT TERM GOAL #1   Period  Days        PT Long Term Goals - 09/01/17 1018      PT  LONG TERM GOAL #1   Title  Independent with advanced HEP (10/10/17)    Status  On-going      PT LONG TERM GOAL #2   Title  improve Lt Knee ROM -4 extension to 118 flexion ( 10/10/17)      Status  Partially Met met for extension, not flexion      PT LONG TERM GOAL #3   Title  demo Lt LE strength =/> 5-/5 to allow alternating gait on stairs (10/10/17)    Status  On-going      PT LONG TERM GOAL #4   Title  improve gait pattern with minimal limp with improved gait pattern with walking (10/10/17)      Status  On-going      PT LONG TERM GOAL #5   Title  Maintain FOTO =/< 52% limited, CK level  ( 09/12/17)      Status  On-going            Plan - 09/01/17 0944    Clinical Impression Statement  Megan Petersen is making progress with Lt knee ROM, partially met that goal. Her pain is better today as compared to last week, she is elevating more at home. She fatigues quicklly with weighbearing work.     Rehab Potential  Excellent    PT Frequency  2x / week    PT Duration  12 weeks    PT Treatment/Interventions  Gait training;Neuromuscular re-education;Manual techniques;Functional mobility training;Moist Heat;Ultrasound;Therapeutic activities;Patient/family education;Taping;Vasopneumatic Device;Therapeutic exercise;Cryotherapy;Electrical Stimulation;Passive range of motion    PT Next Visit Plan  tape was held last visit, see how she did without and if increased swelling re-tape.     Consulted and Agree with Plan of Care  Patient       Patient will benefit from skilled therapeutic intervention in order to improve the following deficits and impairments:  Abnormal gait, Pain, Decreased mobility, Decreased scar mobility, Increased muscle spasms, Decreased strength, Decreased range of motion, Decreased endurance, Decreased balance, Difficulty walking, Increased edema  Visit Diagnosis: Muscle weakness (generalized)  Localized edema  Stiffness of left knee, not elsewhere classified  Acute pain of left  knee  Other abnormalities of gait and mobility     Problem List Patient Active Problem List   Diagnosis Date Noted  . Primary osteoarthritis of left knee 07/16/2017  . Right knee DJD 06/13/2016  . Complete rotator cuff tear 12/01/2015  . Wheezing 04/07/2014  . Impaired fasting glucose 07/26/2013  . Essential hypertension, benign 07/26/2013  . Back pain 07/26/2013  . Knee pain, bilateral 01/10/2013  . Morbid obesity ([place]) 01/10/2013  . Headache(784.0) 12/06/2012    Jeral Pinch PT  09/01/2017, 10:21 AM  [facility] [place], [place], [address] Phone: [phone]   Fax:  [phone]  Name: Megan Petersen MRN: [id number] Date of Birth: 01-24-1961

## 2017-09-03 ENCOUNTER — Encounter: Payer: Self-pay | Admitting: Rehabilitative and Restorative Service Providers"

## 2017-09-03 ENCOUNTER — Ambulatory Visit (INDEPENDENT_AMBULATORY_CARE_PROVIDER_SITE_OTHER): Payer: BLUE CROSS/BLUE SHIELD | Admitting: Rehabilitative and Restorative Service Providers"

## 2017-09-03 DIAGNOSIS — M6281 Muscle weakness (generalized): Secondary | ICD-10-CM | POA: Diagnosis not present

## 2017-09-03 DIAGNOSIS — R2689 Other abnormalities of gait and mobility: Secondary | ICD-10-CM

## 2017-09-03 DIAGNOSIS — M25562 Pain in left knee: Secondary | ICD-10-CM | POA: Diagnosis not present

## 2017-09-03 DIAGNOSIS — R600 Localized edema: Secondary | ICD-10-CM

## 2017-09-03 DIAGNOSIS — M25662 Stiffness of left knee, not elsewhere classified: Secondary | ICD-10-CM | POA: Diagnosis not present

## 2017-09-03 NOTE — Therapy (Signed)
[place], [place], [address] Phone: [phone]   Fax:  [phone]  Physical Therapy Treatment  Patient Details  Name: Megan Petersen MRN: [id number] Date of Birth: 10/25/1960 Referring Provider: Dr Donnald Garre   Encounter Date: 09/03/2017  PT End of Session - 09/03/17 0937    Visit Number  10    Number of Visits  18    Date for PT Re-Evaluation  10/10/17    PT Start Time  0931    PT Stop Time  1027    PT Time Calculation (min)  56 min    Activity Tolerance  Patient tolerated treatment well       Past Medical History:  Diagnosis Date  . Allergy   . Asthma   . Atypical glandular cells on Pap smear   . Esophageal reflux   . Family history of adverse reaction to anesthesia    brother slow to awaken  . GERD (gastroesophageal reflux disease)    Nexium controls  . Headache   . Hemorrhoids    history of- not a problem at present.  . Hyperlipidemia   . Hypertension   . Impaired fasting glucose    Dr. Darcella Cheshire -told pt Hgb A1C level was good.  Marland Kitchen LSIL (low grade squamous intraepithelial lesion) on Pap smear   . Morbid obesity ([place])   . Osteoarthritis    Bilateral knees  . Osteoarthritis   . Pneumonia 2016  . PONV (postoperative nausea and vomiting)   . Pre-diabetes    hgA1c on 06-09-17 was 5.7  . Primary cough headache   . Rosacea   . Swelling of limb   . Transfusion history    '88- s/p childbirth  . Trigeminal neuralgia   . URI (upper respiratory infection)    cough with green sputum since 11-26-15  . Vertigo    10 yrs ago- only-    Past Surgical History:  Procedure Laterality Date  . CHOLECYSTECTOMY     history of gallstones  . LEEP    . right knee meniscus repair  2014  . SHOULDER OPEN ROTATOR CUFF REPAIR Right 12/01/2015   Procedure: RIGHT SHOULDER MINI OPEN ROTATOR CUFF REPAIR WITH RESECTON OF DISTAL CLAVICLE AND SUBACROMIAL DECOMPRESSION;  Surgeon: Susa Day, MD;   [facility];  Service: Orthopedics;  Laterality: Right;  . TOTAL KNEE ARTHROPLASTY Right 06/13/2016   Procedure: RIGHT TOTAL KNEE ARTHROPLASTY;  Surgeon: Susa Day, MD;  [facility]: [facility];  Service: Orthopedics;  Laterality: Right;  . TOTAL KNEE ARTHROPLASTY Left 07/16/2017   Procedure: LEFT TOTAL KNEE ARTHROPLASTY;  Surgeon: Susa Day, MD;  [facility]: [facility];  Service: Orthopedics;  Laterality: Left;  2.5 hrs    There were no vitals filed for this visit.  Subjective Assessment - 09/03/17 0940    Subjective  Turned over in bed last night and felt and heard a "pop" in the Lt knee. She has had some pain in the knee just below the knee cap area since that. She continues to have increased pain and tightness in the Lt knee.     Currently in Pain?  Yes    Pain Score  7     Pain Location  Knee    Pain Orientation  Left    Pain Descriptors / Indicators  Aching;Tightness    Pain Type  Surgical pain    Pain Onset  More than a month ago    Pain Frequency  Intermittent  [facility] Adult PT Treatment/Exercise - 09/03/17 0001      Knee/Hip Exercises: Stretches   Active Hamstring Stretch  -- seated with heel resting on the floor for stretch 30 sec x3     Passive Hamstring Stretch  Left;3 reps;30 seconds supine with strap     ITB Stretch  Left;3 reps;30 seconds supine with strap     Gastroc Stretch  Left;3 reps;30 seconds      Knee/Hip Exercises: Aerobic   Stationary Bike  for ROM x 6'      Knee/Hip Exercises: Standing   Lateral Step Up  Left;2 sets;10 reps;Step Height: 4";Hand Hold: 1    Forward Step Up  Left;2 sets;10 reps;Step Height: 6";Hand Hold: 1    SLS  3x30 sec Lt LE with HHA as needed       Knee/Hip Exercises: Seated   Heel Slides  AAROM;Left;10 reps 10 sec scoots to bend knee      Knee/Hip Exercises: Supine   Quad Sets  Strengthening;Left;10 reps 10 sec holds    Heel Slides  AROM;10 reps      Electrical Stimulation   Electrical  Stimulation Location  Lt knee     Electrical Stimulation Action  ion repelling     Electrical Stimulation Parameters  to tolerance    Electrical Stimulation Goals  Edema;Pain      Vasopneumatic   Number Minutes Vasopneumatic   15 minutes    Vasopnuematic Location   Knee    Vasopneumatic Pressure  Medium    Vasopneumatic Temperature   3*      Manual Therapy   Kinesiotex  Edema basket weave to reduce edema                PT Short Term Goals - 08/06/17 1015      PT SHORT TERM GOAL #1   Period  Days        PT Long Term Goals - 09/01/17 1018      PT LONG TERM GOAL #1   Title  Independent with advanced HEP (10/10/17)    Status  On-going      PT LONG TERM GOAL #2   Title  improve Lt Knee ROM -4 extension to 118 flexion ( 10/10/17)      Status  Partially Met met for extension, not flexion      PT LONG TERM GOAL #3   Title  demo Lt LE strength =/> 5-/5 to allow alternating gait on stairs (10/10/17)    Status  On-going      PT LONG TERM GOAL #4   Title  improve gait pattern with minimal limp with improved gait pattern with walking (10/10/17)      Status  On-going      PT LONG TERM GOAL #5   Title  Maintain FOTO =/< 52% limited, CK level  ( 09/12/17)      Status  On-going            Plan - 09/03/17 8882    Clinical Impression Statement  Flare of pain Lt knee after she felt a pop in the knee last night when turning over in bed. She has had persistent pain and tightness in the Lt knee. Tolerated some exercises in clinic today modified as needed for pain.     Rehab Potential  Excellent    PT Frequency  2x / week    PT Duration  12 weeks    PT Treatment/Interventions  Gait training;Neuromuscular re-education;Manual techniques;Functional mobility training;Moist Heat;Ultrasound;Therapeutic  activities;Patient/family education;Taping;Vasopneumatic Device;Therapeutic exercise;Cryotherapy;Electrical Stimulation;Passive range of motion    PT Next Visit Plan  continue with knee  rehab as tolerated     Consulted and Agree with Plan of Care  Patient       Patient will benefit from skilled therapeutic intervention in order to improve the following deficits and impairments:  Abnormal gait, Pain, Decreased mobility, Decreased scar mobility, Increased muscle spasms, Decreased strength, Decreased range of motion, Decreased endurance, Decreased balance, Difficulty walking, Increased edema  Visit Diagnosis: Muscle weakness (generalized)  Localized edema  Stiffness of left knee, not elsewhere classified  Acute pain of left knee  Other abnormalities of gait and mobility     Problem List Patient Active Problem List   Diagnosis Date Noted  . Primary osteoarthritis of left knee 07/16/2017  . Right knee DJD 06/13/2016  . Complete rotator cuff tear 12/01/2015  . Wheezing 04/07/2014  . Impaired fasting glucose 07/26/2013  . Essential hypertension, benign 07/26/2013  . Back pain 07/26/2013  . Knee pain, bilateral 01/10/2013  . Morbid obesity ([place]) 01/10/2013  . Headache(784.0) 12/06/2012    Marnita Poirier Nilda Simmer PT, MPH  09/03/2017, 10:16 AM  [facility] [place], [place], [address] Phone: [phone]   Fax:  [phone]  Name: Megan Petersen MRN: [id number] Date of Birth: 09-Dec-1960

## 2017-09-05 ENCOUNTER — Ambulatory Visit (INDEPENDENT_AMBULATORY_CARE_PROVIDER_SITE_OTHER): Payer: BLUE CROSS/BLUE SHIELD | Admitting: Rehabilitative and Restorative Service Providers"

## 2017-09-05 ENCOUNTER — Encounter: Payer: Self-pay | Admitting: Rehabilitative and Restorative Service Providers"

## 2017-09-05 DIAGNOSIS — M6281 Muscle weakness (generalized): Secondary | ICD-10-CM | POA: Diagnosis not present

## 2017-09-05 DIAGNOSIS — R600 Localized edema: Secondary | ICD-10-CM

## 2017-09-05 DIAGNOSIS — M25662 Stiffness of left knee, not elsewhere classified: Secondary | ICD-10-CM | POA: Diagnosis not present

## 2017-09-05 DIAGNOSIS — M25562 Pain in left knee: Secondary | ICD-10-CM

## 2017-09-05 NOTE — Therapy (Signed)
[place], [place], [address] Phone: [phone]   Fax:  [phone]  Physical Therapy Treatment  Patient Details  Name: Megan Petersen MRN: [id number] Date of Birth: Nov 01, 1960 Referring Provider: Dr Donnald Garre   Encounter Date: 09/05/2017  PT End of Session - 09/05/17 1059    Visit Number  11    Number of Visits  18    Date for PT Re-Evaluation  10/10/17    PT Start Time  1059    PT Stop Time  1202    PT Time Calculation (min)  63 min    Activity Tolerance  Patient tolerated treatment well       Past Medical History:  Diagnosis Date  . Allergy   . Asthma   . Atypical glandular cells on Pap smear   . Esophageal reflux   . Family history of adverse reaction to anesthesia    brother slow to awaken  . GERD (gastroesophageal reflux disease)    Nexium controls  . Headache   . Hemorrhoids    history of- not a problem at present.  . Hyperlipidemia   . Hypertension   . Impaired fasting glucose    Dr. Darcella Cheshire -told pt Hgb A1C level was good.  Marland Kitchen LSIL (low grade squamous intraepithelial lesion) on Pap smear   . Morbid obesity ([place])   . Osteoarthritis    Bilateral knees  . Osteoarthritis   . Pneumonia 2016  . PONV (postoperative nausea and vomiting)   . Pre-diabetes    hgA1c on 06-09-17 was 5.7  . Primary cough headache   . Rosacea   . Swelling of limb   . Transfusion history    '88- s/p childbirth  . Trigeminal neuralgia   . URI (upper respiratory infection)    cough with green sputum since 11-26-15  . Vertigo    10 yrs ago- only-    Past Surgical History:  Procedure Laterality Date  . CHOLECYSTECTOMY     history of gallstones  . LEEP    . right knee meniscus repair  2014  . SHOULDER OPEN ROTATOR CUFF REPAIR Right 12/01/2015   Procedure: RIGHT SHOULDER MINI OPEN ROTATOR CUFF REPAIR WITH RESECTON OF DISTAL CLAVICLE AND SUBACROMIAL DECOMPRESSION;  Surgeon: Susa Day, MD;   Location: [facility];  Service: Orthopedics;  Laterality: Right;  . TOTAL KNEE ARTHROPLASTY Right 06/13/2016   Procedure: RIGHT TOTAL KNEE ARTHROPLASTY;  Surgeon: Susa Day, MD;  Location: [facility];  Service: Orthopedics;  Laterality: Right;  . TOTAL KNEE ARTHROPLASTY Left 07/16/2017   Procedure: LEFT TOTAL KNEE ARTHROPLASTY;  Surgeon: Susa Day, MD;  Location: [facility];  Service: Orthopedics;  Laterality: Left;  2.5 hrs    There were no vitals filed for this visit.  Subjective Assessment - 09/05/17 1103    Subjective  Knee is feeling some better today. Has improved since last visit. Still feels really tight across the front of the knee and has since surgery - much tighter than with Rt TKA. Some improvement reported following today's treatment.     Currently in Pain?  Yes    Pain Score  1     Pain Location  Knee    Pain Orientation  Left    Pain Descriptors / Indicators  Aching;Tightness    Pain Type  Surgical pain    Pain Onset  In the past 7 days    Pain Frequency  Intermittent  [facility] PT Assessment - 09/05/17 0001      Assessment   Medical Diagnosis  Lt TKA    Referring Provider  Dr Donnald Garre    Onset Date/Surgical Date  07/16/17    Hand Dominance  Right    Next MD Visit  10/08/17      PROM   Left Knee Flexion  112                  [facility] Adult PT Treatment/Exercise - 09/05/17 0001      Knee/Hip Exercises: Stretches   Quad Stretch  Left;4 reps;30 seconds prone with strap     Quad Stretch Limitations  repeated after [place]       Knee/Hip Exercises: Aerobic   Stationary Bike  L2 x 6 min       Knee/Hip Exercises: Standing   Knee Flexion  AAROM;PROM;Left;5 reps fwd lunge working on ROM - 20 sec hold     Hip Flexion  AROM;Left;2 sets;10 reps;Knee bent      Knee/Hip Exercises: Seated   Heel Slides  AAROM;Left;10 reps 20 sec hold in knee flexion    Heel Slides Limitations  repeated after [place]     Sit to Sand  10 reps;without UE support      Knee/Hip  Exercises: Supine   Heel Slides  AROM;AAROM;Left;10 reps assisting with strap       Electrical Stimulation   Electrical Stimulation Location  Lt knee     Electrical Stimulation Action  ion repelling     Electrical Stimulation Parameters  to tolerance    Electrical Stimulation Goals  Edema;Pain      Ultrasound   Ultrasound Location  Lt distal quad  pt sitting with knee in flexion     Ultrasound Parameters  1.5 w/cm2; 1 mHz; 100%; 8 min     Ultrasound Goals  Pain;Other (Comment) tightness       Vasopneumatic   Number Minutes Vasopneumatic   15 minutes    Vasopnuematic Location   Knee    Vasopneumatic Pressure  Medium    Vasopneumatic Temperature   3*      Manual Therapy   Soft tissue mobilization  working through distal quad with massage stick                PT Short Term Goals - 08/06/17 1015      PT SHORT TERM GOAL #1   Period  Days        PT Long Term Goals - 09/01/17 1018      PT LONG TERM GOAL #1   Title  Independent with advanced HEP (10/10/17)    Status  On-going      PT LONG TERM GOAL #2   Title  improve Lt Knee ROM -4 extension to 118 flexion ( 10/10/17)      Status  Partially Met met for extension, not flexion      PT LONG TERM GOAL #3   Title  demo Lt LE strength =/> 5-/5 to allow alternating gait on stairs (10/10/17)    Status  On-going      PT LONG TERM GOAL #4   Title  improve gait pattern with minimal limp with improved gait pattern with walking (10/10/17)      Status  On-going      PT LONG TERM GOAL #5   Title  Maintain FOTO =/< 52% limited, CK level  ( 09/12/17)      Status  On-going  Plan - 09/05/17 1152    Clinical Impression Statement  Knee pain is improved today compared to last visit. Patient continues to c/o pain in the anterior knee and tightness in the knee and distal quad. She responded well to intervention today with focus on knee flexion adding [place] to distal quad and lateral knee area to promote increased tissue  extensibility and increase ROM. Good gains in knee flexion post treatment. Progressing well toward stated goals of therapy. .    Rehab Potential  Excellent    PT Frequency  2x / week    PT Duration  12 weeks    PT Treatment/Interventions  Gait training;Neuromuscular re-education;Manual techniques;Functional mobility training;Moist Heat;Ultrasound;Therapeutic activities;Patient/family education;Taping;Vasopneumatic Device;Therapeutic exercise;Cryotherapy;Electrical Stimulation;Passive range of motion    PT Next Visit Plan  continue with knee rehab as tolerated; assess response to [place]    Consulted and Agree with Plan of Care  Patient       Patient will benefit from skilled therapeutic intervention in order to improve the following deficits and impairments:  Abnormal gait, Pain, Decreased mobility, Decreased scar mobility, Increased muscle spasms, Decreased strength, Decreased range of motion, Decreased endurance, Decreased balance, Difficulty walking, Increased edema  Visit Diagnosis: Muscle weakness (generalized)  Localized edema  Stiffness of left knee, not elsewhere classified  Acute pain of left knee     Problem List Patient Active Problem List   Diagnosis Date Noted  . Primary osteoarthritis of left knee 07/16/2017  . Right knee DJD 06/13/2016  . Complete rotator cuff tear 12/01/2015  . Wheezing 04/07/2014  . Impaired fasting glucose 07/26/2013  . Essential hypertension, benign 07/26/2013  . Back pain 07/26/2013  . Knee pain, bilateral 01/10/2013  . Morbid obesity ([place]) 01/10/2013  . Headache(784.0) 12/06/2012    Cartrell Bentsen Nilda Simmer PT, MPH  09/05/2017, 12:02 PM  [facility] [place], [place], [address] Phone: [phone]   Fax:  [phone]  Name: JOURNEI THOMASSEN MRN: [id number] Date of Birth: 1960/11/25

## 2017-09-08 ENCOUNTER — Encounter: Payer: Self-pay | Admitting: Rehabilitative and Restorative Service Providers"

## 2017-09-08 ENCOUNTER — Ambulatory Visit (INDEPENDENT_AMBULATORY_CARE_PROVIDER_SITE_OTHER): Payer: BLUE CROSS/BLUE SHIELD | Admitting: Rehabilitative and Restorative Service Providers"

## 2017-09-08 DIAGNOSIS — R2689 Other abnormalities of gait and mobility: Secondary | ICD-10-CM

## 2017-09-08 DIAGNOSIS — M25662 Stiffness of left knee, not elsewhere classified: Secondary | ICD-10-CM

## 2017-09-08 DIAGNOSIS — M6281 Muscle weakness (generalized): Secondary | ICD-10-CM

## 2017-09-08 DIAGNOSIS — M25562 Pain in left knee: Secondary | ICD-10-CM | POA: Diagnosis not present

## 2017-09-08 DIAGNOSIS — R600 Localized edema: Secondary | ICD-10-CM

## 2017-09-08 NOTE — Patient Instructions (Addendum)
Strengthening: Hip Abduction - Resisted    Standing, extend leg out from side leading with your heel. Repeat __10__ times per set. Do __2-3__ sets per session. Do __1-2_ sessions per day. Repeat on opposite leg

## 2017-09-08 NOTE — Therapy (Addendum)
[facility] [place] [address] [place], [place], [address] Phone: [phone]   Fax:  [phone]  Physical Therapy Treatment  Patient Details  Name: Megan Petersen MRN: [id number] Date of Birth: 1961/07/12 Referring Provider: Dr Leandra Kern   Encounter Date: 09/08/2017  PT End of Session - 09/08/17 0945    Visit Number  12    Number of Visits  18    Date for PT Re-Evaluation  10/10/17    PT Start Time  0931    PT Stop Time  1031    PT Time Calculation (min)  60 min    Activity Tolerance  Patient tolerated treatment well       Past Medical History:  Diagnosis Date  . Allergy   . Asthma   . Atypical glandular cells on Pap smear   . Esophageal reflux   . Family history of adverse reaction to anesthesia    brother slow to awaken  . GERD (gastroesophageal reflux disease)    Nexium controls  . Headache   . Hemorrhoids    history of- not a problem at present.  . Hyperlipidemia   . Hypertension   . Impaired fasting glucose    Dr. Jenny Reichmann -told pt Hgb A1C level was good.  Marland Kitchen LSIL (low grade squamous intraepithelial lesion) on Pap smear   . Morbid obesity (HCC)   . Osteoarthritis    Bilateral knees  . Osteoarthritis   . Pneumonia 2016  . PONV (postoperative nausea and vomiting)   . Pre-diabetes    hgA1c on 06-09-17 was 5.7  . Primary cough headache   . Rosacea   . Swelling of limb   . Transfusion history    '88- s/p childbirth  . Trigeminal neuralgia   . URI (upper respiratory infection)    cough with green sputum since 11-26-15  . Vertigo    10 yrs ago- only-    Past Surgical History:  Procedure Laterality Date  . CHOLECYSTECTOMY     history of gallstones  . LEEP    . right knee meniscus repair  2014  . SHOULDER OPEN ROTATOR CUFF REPAIR Right 12/01/2015   Procedure: RIGHT SHOULDER MINI OPEN ROTATOR CUFF REPAIR WITH RESECTON OF DISTAL CLAVICLE AND SUBACROMIAL DECOMPRESSION;  Surgeon: Jene Every, MD;   Location: [facility];  Service: Orthopedics;  Laterality: Right;  . TOTAL KNEE ARTHROPLASTY Right 06/13/2016   Procedure: RIGHT TOTAL KNEE ARTHROPLASTY;  Surgeon: Jene Every, MD;  Location: [facility];  Service: Orthopedics;  Laterality: Right;  . TOTAL KNEE ARTHROPLASTY Left 07/16/2017   Procedure: LEFT TOTAL KNEE ARTHROPLASTY;  Surgeon: Jene Every, MD;  Location: [facility];  Service: Orthopedics;  Laterality: Left;  2.5 hrs    There were no vitals filed for this visit.  Subjective Assessment - 09/08/17 0951    Subjective  knee was sore from all the bending we did last session but notices that the knot that was at the top of the scar in the thigh is not there. Knee does not feel as tight. Walking some in the home without her cane.     Currently in Pain?  Yes    Pain Score  1     Pain Location  Knee    Pain Orientation  Left    Pain Descriptors / Indicators  Aching;Tightness    Pain Type  Surgical pain    Pain Onset  More than a month ago    Pain Frequency  Intermittent  [facility] Adult PT Treatment/Exercise - 09/08/17 0001      Knee/Hip Exercises: Stretches   Quad Stretch  Left;4 reps;30 seconds prone with strap     Quad Stretch Limitations  repeated after [place]       Knee/Hip Exercises: Aerobic   Stationary Bike  L3 x 6 min       Knee/Hip Exercises: Standing   Knee Flexion  AAROM;PROM;Left;5 reps fwd lunge working on ROM - 20 sec hold     Hip Flexion  AROM;Left;2 sets;10 reps;Knee bent    Hip ADduction  AROM;Right;Left;2 sets;10 reps leading out with heel - standing at counter for UE support     Lateral Step Up  Left;2 sets;10 reps;Step Height: 4";Hand Hold: 1    Forward Step Up  Left;2 sets;10 reps;Step Height: 6";Hand Hold: 1    SLS  5x20 sec Lt LE with 0n blue theracushion HHA as needed     Other Standing Knee Exercises  side steps Rt/Lt 12 ft x 5 each; heel to toe and backwards walking 12 ft x 5 each at counter       Knee/Hip Exercises: Seated    Heel Slides  AAROM;Left;10 reps 20 sec hold in knee flexion    Sit to Sand  10 reps;without UE support slow eccentric lowering to sit from stand       Knee/Hip Exercises: Supine   Heel Slides  AROM;AAROM;Left;5 reps assisting with strap       Programme researcher, broadcasting/film/video Location  Lt knee     Conservation officer, historic buildings Parameters  to tolerance    Electrical Stimulation Goals  Edema;Pain      Ultrasound   Ultrasound Location  Lt quad/incision     Ultrasound Parameters  1.5 w/cm2; 1 mHz; 100%; 8 min     Ultrasound Goals  Pain;Other (Comment) tightness       Vasopneumatic   Number Minutes Vasopneumatic   15 minutes    Vasopnuematic Location   Knee    Vasopneumatic Pressure  Medium    Vasopneumatic Temperature   3*      Manual Therapy   Soft tissue mobilization  scar massage     Kinesiotex  Edema basket weave anterior knee              PT Education - 09/08/17 1021    Education provided  Yes    Education Details  HEP     Person(s) Educated  Patient    Methods  Explanation;Demonstration;Tactile cues;Verbal cues;Handout    Comprehension  Verbalized understanding;Returned demonstration;Verbal cues required;Tactile cues required       PT Short Term Goals - 08/06/17 1015      PT SHORT TERM GOAL #1   Period  Days        PT Long Term Goals - 09/08/17 0946      PT LONG TERM GOAL #1   Title  Independent with advanced HEP (10/10/17)    Time  10    Period  Weeks    Status  On-going      PT LONG TERM GOAL #2   Title  improve Lt Knee ROM -4 extension to 118 flexion ( 10/10/17)      Time  10    Period  Weeks    Status  On-going      PT LONG TERM GOAL #3   Title  demo Lt LE strength =/> 5-/5 to allow alternating gait  on stairs (10/10/17)    Time  10    Period  Weeks    Status  On-going      PT LONG TERM GOAL #4   Title  improve gait pattern with minimal limp with improved gait pattern with walking  (10/10/17)      Time  10    Status  On-going      PT LONG TERM GOAL #5   Title  Maintain FOTO =/< 52% limited, CK level  ( 09/12/17)      Time  10    Period  Weeks    Status  On-going            Plan - 09/08/17 0948    Clinical Impression Statement  Excellent response to US Lt knee/distal quad with less palpable tightness and decreased sensation of pulling and tightness with movement per pt report. Patient tolerated exercise well working on ROM and strengthening. Progressing well toward stated goals of therapy.     Rehab Potential  Excellent    PT Frequency  2x / week    PT Duration  12 weeks    PT Treatment/Interventions  Gait training;Neuromuscular re-education;Manual techniques;Functional mobility training;Moist Heat;Ultrasound;Therapeutic activities;Patient/family education;Taping;Vasopneumatic Device;Therapeutic exercise;Cryotherapy;Electrical Stimulation;Passive range of motion    PT Next Visit Plan  continue with knee rehab as tolerated; continue US as indicated for tightness     Consulted and Agree with Plan of Care  Patient       Patient will benefit from skilled therapeutic intervention in order to improve the following deficits and impairments:  Abnormal gait, Pain, Decreased mobility, Decreased scar mobility, Increased muscle spasms, Decreased strength, Decreased range of motion, Decreased endurance, Decreased balance, Difficulty walking, Increased edema  Visit Diagnosis: Muscle weakness (generalized)  Localized edema  Stiffness of left knee, not elsewhere classified  Acute pain of left knee  Other abnormalities of gait and mobility     Problem List Patient Active Problem List   Diagnosis Date Noted  . Primary osteoarthritis of left knee 07/16/2017  . Right knee DJD 06/13/2016  . Complete rotator cuff tear 12/01/2015  . Wheezing 04/07/2014  . Impaired fasting glucose 07/26/2013  . Essential hypertension, benign 07/26/2013  . Back pain 07/26/2013  . Knee  pain, bilateral 01/10/2013  . Morbid obesity (HCC) 01/10/2013  . Headache(784.0) 12/06/2012    Jeylin Woodmansee Rober MinionP Jaliana Medellin PT, MPH  09/08/2017, 10:43 AM  [facility] [address] [place], [place], [address] Phone: [phone]   Fax:  [phone]  Name: Megan Petersen MRN: [id number] Date of Birth: February 13, 1961

## 2017-09-11 ENCOUNTER — Encounter: Payer: Self-pay | Admitting: Rehabilitative and Restorative Service Providers"

## 2017-09-11 ENCOUNTER — Ambulatory Visit (INDEPENDENT_AMBULATORY_CARE_PROVIDER_SITE_OTHER): Payer: BLUE CROSS/BLUE SHIELD | Admitting: Rehabilitative and Restorative Service Providers"

## 2017-09-11 DIAGNOSIS — M25562 Pain in left knee: Secondary | ICD-10-CM | POA: Diagnosis not present

## 2017-09-11 DIAGNOSIS — R600 Localized edema: Secondary | ICD-10-CM | POA: Diagnosis not present

## 2017-09-11 DIAGNOSIS — M6281 Muscle weakness (generalized): Secondary | ICD-10-CM | POA: Diagnosis not present

## 2017-09-11 DIAGNOSIS — M25662 Stiffness of left knee, not elsewhere classified: Secondary | ICD-10-CM

## 2017-09-11 NOTE — Therapy (Signed)
[facility] [address] [place], [place], [address] Phone: [phone]   Fax:  [phone]  Physical Therapy Treatment  Patient Details  Name: Megan Petersen MRN: [id number] Date of Birth: 12-14-1960 Referring Provider: Dr Leandra KernJeffery Petersen   Encounter Date: 09/11/2017  PT End of Session - 09/11/17 0936    Visit Number  13    Number of Visits  18    Date for PT Re-Evaluation  10/10/17    PT Start Time  0931    PT Stop Time  1030    PT Time Calculation (min)  59 min    Activity Tolerance  Patient tolerated treatment well       Past Medical History:  Diagnosis Date  . Allergy   . Asthma   . Atypical glandular cells on Pap smear   . Esophageal reflux   . Family history of adverse reaction to anesthesia    brother slow to awaken  . GERD (gastroesophageal reflux disease)    Nexium controls  . Headache   . Hemorrhoids    history of- not a problem at present.  . Hyperlipidemia   . Hypertension   . Impaired fasting glucose    Dr. Jenny ReichmannZanard,PCP -told pt Hgb A1C level was good.  Marland Kitchen. LSIL (low grade squamous intraepithelial lesion) on Pap smear   . Morbid obesity (HCC)   . Osteoarthritis    Bilateral knees  . Osteoarthritis   . Pneumonia 2016  . PONV (postoperative nausea and vomiting)   . Pre-diabetes    hgA1c on 06-09-17 was 5.7  . Primary cough headache   . Rosacea   . Swelling of limb   . Transfusion history    '88- s/p childbirth  . Trigeminal neuralgia   . URI (upper respiratory infection)    cough with green sputum since 11-26-15  . Vertigo    10 yrs ago- only-    Past Surgical History:  Procedure Laterality Date  . CHOLECYSTECTOMY     history of gallstones  . LEEP    . right knee meniscus repair  2014  . SHOULDER OPEN ROTATOR CUFF REPAIR Right 12/01/2015   Procedure: RIGHT SHOULDER MINI OPEN ROTATOR CUFF REPAIR WITH RESECTON OF DISTAL CLAVICLE AND SUBACROMIAL DECOMPRESSION;  Surgeon: Megan EveryJeffrey Beane, MD;   Location: [facility];  Service: Orthopedics;  Laterality: Right;  . TOTAL KNEE ARTHROPLASTY Right 06/13/2016   Procedure: RIGHT TOTAL KNEE ARTHROPLASTY;  Surgeon: Megan EveryJeffrey Beane, MD;  Location: [facility];  Service: Orthopedics;  Laterality: Right;  . TOTAL KNEE ARTHROPLASTY Left 07/16/2017   Procedure: LEFT TOTAL KNEE ARTHROPLASTY;  Surgeon: Megan EveryBeane, Jeffrey, MD;  Location: [facility];  Service: Orthopedics;  Laterality: Left;  2.5 hrs    There were no vitals filed for this visit.  Subjective Assessment - 09/11/17 0936    Subjective  Megan LoserRhonda reports that she is always stiff and sore the day after therapy. Feels the US has helped loosen up the knots at the top of the scar. Awoke at 3 am this moring and could not get comfortable again.     Currently in Pain?  Yes    Pain Score  2     Pain Location  Knee    Pain Orientation  Left    Pain Descriptors / Indicators  Tightness    Pain Type  Surgical pain    Pain Onset  More than a month ago    Pain Frequency  Intermittent  [facility] Adult PT Treatment/Exercise - 09/11/17 0001      Knee/Hip Exercises: Stretches   Quad Stretch  Left;4 reps;30 seconds prone with strap     Quad Stretch Limitations  repeated after [place]       Knee/Hip Exercises: Aerobic   Stationary Bike  L3 x 6 min       Knee/Hip Exercises: Standing   Knee Flexion  AAROM;PROM;Left;5 reps fwd lunge working on ROM - 20 sec hold     Hip Flexion  AROM;Left;2 sets;10 reps;Knee bent    Hip Abduction  AROM;Right;Left;2 sets;10 reps;Knee straight green TB - leading with heel     Lateral Step Up  Left;2 sets;10 reps;Step Height: 4";Hand Hold: 1 heel tap without full transfer of weight     Forward Step Up  Left;2 sets;10 reps;Step Height: 6";Hand Hold: 1    Step Down  Right;2 sets;10 reps;Hand Hold: 2;Step Height: 6"    SLS  5x20 sec Lt LE with 0n blue theracushion HHA as needed       Knee/Hip Exercises: Seated   Sit to Sand  10 reps;without UE support slow eccentric  lowering to sit from stand green TB for abd       Knee/Hip Exercises: Supine   Heel Slides  AROM;AAROM;Left;5 reps assisting with strap       Programme researcher, broadcasting/film/video Location  Lt knee     Electrical Stimulation Action  ion repelling    Electrical Stimulation Parameters  to tolerance    Electrical Stimulation Goals  Edema;Pain      Ultrasound   Ultrasound Location  Lt quad/incisioin area     Ultrasound Parameters  1.5 w/cm2; 1 mHz; 100%; 8 min     Ultrasound Goals  Pain;Other (Comment) tightness       Vasopneumatic   Number Minutes Vasopneumatic   15 minutes    Vasopnuematic Location   Knee    Vasopneumatic Pressure  Medium    Vasopneumatic Temperature   3*      Manual Therapy   Soft tissue mobilization  scar massage     Kinesiotex  Edema basket weave anterior knee                PT Short Term Goals - 08/06/17 1015      PT SHORT TERM GOAL #1   Period  Days        PT Long Term Goals - 09/08/17 0946      PT LONG TERM GOAL #1   Title  Independent with advanced HEP (10/10/17)    Time  10    Period  Weeks    Status  On-going      PT LONG TERM GOAL #2   Title  improve Lt Knee ROM -4 extension to 118 flexion ( 10/10/17)      Time  10    Period  Weeks    Status  On-going      PT LONG TERM GOAL #3   Title  demo Lt LE strength =/> 5-/5 to allow alternating gait on stairs (10/10/17)    Time  10    Period  Weeks    Status  On-going      PT LONG TERM GOAL #4   Title  improve gait pattern with minimal limp with improved gait pattern with walking (10/10/17)      Time  10    Status  On-going      PT LONG TERM GOAL #5  Title  Maintain FOTO =/< 52% limited, CK level  ( 09/12/17)      Time  10    Period  Weeks    Status  On-going            Plan - 09/11/17 0948    Clinical Impression Statement  Responding well to [place] Lt knee through the distal quad and area of scar. She continues to progress well with rehab gaining strength and ROM.  Gait is improving with less limp noted Lt LE. Continues to use single point cane when she is in the community but ambulating more in the home without assistive device.     Rehab Potential  Excellent    PT Frequency  2x / week    PT Duration  12 weeks    PT Treatment/Interventions  Gait training;Neuromuscular re-education;Manual techniques;Functional mobility training;Moist Heat;Ultrasound;Therapeutic activities;Patient/family education;Taping;Vasopneumatic Device;Therapeutic exercise;Cryotherapy;Electrical Stimulation;Passive range of motion    PT Next Visit Plan  continue with knee rehab as tolerated; continue [place] as indicated for tightness     Consulted and Agree with Plan of Care  Patient       Patient will benefit from skilled therapeutic intervention in order to improve the following deficits and impairments:  Abnormal gait, Pain, Decreased mobility, Decreased scar mobility, Increased muscle spasms, Decreased strength, Decreased range of motion, Decreased endurance, Decreased balance, Difficulty walking, Increased edema  Visit Diagnosis: Muscle weakness (generalized)  Localized edema  Stiffness of left knee, not elsewhere classified  Acute pain of left knee     Problem List Patient Active Problem List   Diagnosis Date Noted  . Primary osteoarthritis of left knee 07/16/2017  . Right knee DJD 06/13/2016  . Complete rotator cuff tear 12/01/2015  . Wheezing 04/07/2014  . Impaired fasting glucose 07/26/2013  . Essential hypertension, benign 07/26/2013  . Back pain 07/26/2013  . Knee pain, bilateral 01/10/2013  . Morbid obesity (HCC) 01/10/2013  . Headache(784.0) 12/06/2012    Megan Petersen Megan Petersen PT MPH  09/11/2017, 10:23 AM  [facility] [address] [place], [place], [address] Phone: [phone]   Fax:  [phone]  Name: KAEDYNCE TAPP MRN: [id number] Date of Birth: 1960-11-06

## 2017-09-17 ENCOUNTER — Ambulatory Visit (INDEPENDENT_AMBULATORY_CARE_PROVIDER_SITE_OTHER): Payer: BLUE CROSS/BLUE SHIELD | Admitting: Physical Therapy

## 2017-09-17 ENCOUNTER — Encounter: Payer: Self-pay | Admitting: Physical Therapy

## 2017-09-17 DIAGNOSIS — R600 Localized edema: Secondary | ICD-10-CM | POA: Diagnosis not present

## 2017-09-17 DIAGNOSIS — M25562 Pain in left knee: Secondary | ICD-10-CM | POA: Diagnosis not present

## 2017-09-17 DIAGNOSIS — M6281 Muscle weakness (generalized): Secondary | ICD-10-CM | POA: Diagnosis not present

## 2017-09-17 DIAGNOSIS — R2689 Other abnormalities of gait and mobility: Secondary | ICD-10-CM | POA: Diagnosis not present

## 2017-09-17 DIAGNOSIS — M25662 Stiffness of left knee, not elsewhere classified: Secondary | ICD-10-CM

## 2017-09-17 NOTE — Therapy (Signed)
[place], [place], [address] Phone: [phone]   Fax:  [phone]  Physical Therapy Treatment  Patient Details  Name: Megan Petersen MRN: [id number] Date of Birth: March 09, 1961 Referring Provider: Dr Donnald Garre   Encounter Date: 09/17/2017  PT End of Session - 09/17/17 0932    Visit Number  14    Number of Visits  18    Date for PT Re-Evaluation  10/10/17    PT Start Time  0932    PT Stop Time  1030    PT Time Calculation (min)  58 min       Past Medical History:  Diagnosis Date  . Allergy   . Asthma   . Atypical glandular cells on Pap smear   . Esophageal reflux   . Family history of adverse reaction to anesthesia    brother slow to awaken  . GERD (gastroesophageal reflux disease)    Nexium controls  . Headache   . Hemorrhoids    history of- not a problem at present.  . Hyperlipidemia   . Hypertension   . Impaired fasting glucose    Dr. Darcella Cheshire -told pt Hgb A1C level was good.  Marland Kitchen LSIL (low grade squamous intraepithelial lesion) on Pap smear   . Morbid obesity ([place])   . Osteoarthritis    Bilateral knees  . Osteoarthritis   . Pneumonia 2016  . PONV (postoperative nausea and vomiting)   . Pre-diabetes    hgA1c on 06-09-17 was 5.7  . Primary cough headache   . Rosacea   . Swelling of limb   . Transfusion history    '88- s/p childbirth  . Trigeminal neuralgia   . URI (upper respiratory infection)    cough with green sputum since 11-26-15  . Vertigo    10 yrs ago- only-    Past Surgical History:  Procedure Laterality Date  . CHOLECYSTECTOMY     history of gallstones  . LEEP    . right knee meniscus repair  2014  . SHOULDER OPEN ROTATOR CUFF REPAIR Right 12/01/2015   Procedure: RIGHT SHOULDER MINI OPEN ROTATOR CUFF REPAIR WITH RESECTON OF DISTAL CLAVICLE AND SUBACROMIAL DECOMPRESSION;  Surgeon: Susa Day, MD;  [facility];  Service: Orthopedics;  Laterality:  Right;  . TOTAL KNEE ARTHROPLASTY Right 06/13/2016   Procedure: RIGHT TOTAL KNEE ARTHROPLASTY;  Surgeon: Susa Day, MD;  [facility];  Service: Orthopedics;  Laterality: Right;  . TOTAL KNEE ARTHROPLASTY Left 07/16/2017   Procedure: LEFT TOTAL KNEE ARTHROPLASTY;  Surgeon: Susa Day, MD;  [facility]: [facility];  Service: Orthopedics;  Laterality: Left;  2.5 hrs    There were no vitals filed for this visit.  Subjective Assessment - 09/17/17 0937    Subjective  Megan Petersen reports that for the last two nights she hasn't had to get up    Currently in Pain?  Yes    Pain Score  2     Pain Location  Knee    Pain Orientation  Left    Pain Descriptors / Indicators  Dull    Pain Type  Surgical pain    Pain Onset  More than a month ago    Pain Frequency  Intermittent    Aggravating Factors   bending the knee    Pain Relieving Factors  ice and meds and gentle exercise         [facility] PT Assessment - 09/17/17 0001      Assessment  Medical Diagnosis  Lt TKA      AROM   Left Knee Extension  -6    Left Knee Flexion  120                  [facility] Adult PT Treatment/Exercise - 09/17/17 0001      Knee/Hip Exercises: Stretches   Active Hamstring Stretch  Left with 30 knee presses, 2 sec holds      Knee/Hip Exercises: Aerobic   Stationary Bike  L3 x 6 min       Knee/Hip Exercises: Standing   Knee Flexion  Strengthening;Both;3 sets;10 reps each side with 3#    Other Standing Knee Exercises  sit to/from stand using high mat table then Lt LE with Rt toe touch assist      Knee/Hip Exercises: Supine   Bridges  Strengthening;Both;2 sets;15 reps with 5 sec holds      Modalities   Modalities  Psychologist, educational Location  Lt knee     Electrical Stimulation Action  IFC    Electrical Stimulation Parameters  to tolerance    Electrical Stimulation Goals  Edema;Pain      Vasopneumatic   Number Minutes  Vasopneumatic   15 minutes    Vasopnuematic Location   Knee    Vasopneumatic Pressure  Medium    Vasopneumatic Temperature   3*               PT Short Term Goals - 08/06/17 1015      PT SHORT TERM GOAL #1   Period  Days        PT Long Term Goals - 09/17/17 1610      PT LONG TERM GOAL #1   Title  Independent with advanced HEP (10/10/17)    Status  On-going      PT LONG TERM GOAL #2   Title  improve Lt Knee ROM -4 extension to 118 flexion ( 10/10/17)      Status  Partially Met met for flexion, not extension      PT LONG TERM GOAL #4   Title  improve gait pattern with minimal limp with improved gait pattern with walking (10/10/17)      Status  On-going      PT LONG TERM GOAL #5   Title  Maintain FOTO =/< 52% limited, CK level  ( 09/12/17)      Status  On-going            Plan - 09/17/17 1012    Clinical Impression Statement  Megan Petersen continues to make progress in ROM, she paritally met this goal.  She has a lot of cramping today with her strengthening exercise, this decreased with rest and stretching.  Would benefit from continued tx to meet all her goals and restore her function.     Rehab Potential  Excellent    PT Frequency  2x / week    PT Duration  12 weeks    PT Treatment/Interventions  Gait training;Neuromuscular re-education;Manual techniques;Functional mobility training;Moist Heat;Ultrasound;Therapeutic activities;Patient/family education;Taping;Vasopneumatic Device;Therapeutic exercise;Cryotherapy;Electrical Stimulation;Passive range of motion    PT Next Visit Plan  continue with knee rehab as tolerated; continue [place] as indicated for tightness     Consulted and Agree with Plan of Care  Patient       Patient will benefit from skilled therapeutic intervention in order to improve the following deficits and impairments:  Abnormal gait, Pain, Decreased mobility, Decreased  scar mobility, Increased muscle spasms, Decreased strength, Decreased range of motion,  Decreased endurance, Decreased balance, Difficulty walking, Increased edema  Visit Diagnosis: Muscle weakness (generalized)  Localized edema  Stiffness of left knee, not elsewhere classified  Acute pain of left knee  Other abnormalities of gait and mobility     Problem List Patient Active Problem List   Diagnosis Date Noted  . Primary osteoarthritis of left knee 07/16/2017  . Right knee DJD 06/13/2016  . Complete rotator cuff tear 12/01/2015  . Wheezing 04/07/2014  . Impaired fasting glucose 07/26/2013  . Essential hypertension, benign 07/26/2013  . Back pain 07/26/2013  . Knee pain, bilateral 01/10/2013  . Morbid obesity ([place]) 01/10/2013  . Headache(784.0) 12/06/2012    Jeral Pinch PT 09/17/2017, 10:16 AM  [facility] [place], [place], [address] Phone: [phone]   Fax:  [phone]  Name: Megan Petersen MRN: [id number] Date of Birth: Apr 09, 1961

## 2017-09-19 ENCOUNTER — Encounter: Payer: Self-pay | Admitting: Rehabilitative and Restorative Service Providers"

## 2017-09-19 ENCOUNTER — Ambulatory Visit (INDEPENDENT_AMBULATORY_CARE_PROVIDER_SITE_OTHER): Payer: BLUE CROSS/BLUE SHIELD | Admitting: Rehabilitative and Restorative Service Providers"

## 2017-09-19 DIAGNOSIS — R2689 Other abnormalities of gait and mobility: Secondary | ICD-10-CM | POA: Diagnosis not present

## 2017-09-19 DIAGNOSIS — M25562 Pain in left knee: Secondary | ICD-10-CM

## 2017-09-19 DIAGNOSIS — M25662 Stiffness of left knee, not elsewhere classified: Secondary | ICD-10-CM | POA: Diagnosis not present

## 2017-09-19 DIAGNOSIS — M6281 Muscle weakness (generalized): Secondary | ICD-10-CM

## 2017-09-19 DIAGNOSIS — R600 Localized edema: Secondary | ICD-10-CM

## 2017-09-19 NOTE — Therapy (Signed)
[facility] [address] [place], [place], [address] Phone: [phone]   Fax:  [phone]  Physical Therapy Treatment  Patient Details  Name: Megan Petersen MRN: [id number] Date of Birth: 08/22/1961 Referring Provider: Dr Jeffery Beane   Encounter Date: 09/19/2017  PT End of Session - 09/19/17 0943    Visit Number  15    Number of Visits  18    Date for PT Re-Evaluation  10/10/17    PT Start Time  0930    PT Stop Time  1029    PT Time Calculation (min)  59 min    Activity Tolerance  Patient tolerated treatment well       Past Medical History:  Diagnosis Date  . Allergy   . Asthma   . Atypical glandular cells on Pap smear   . Esophageal reflux   . Family history of adverse reaction to anesthesia    brother slow to awaken  . GERD (gastroesophageal reflux disease)    Nexium controls  . Headache   . Hemorrhoids    history of- not a problem at present.  . Hyperlipidemia   . Hypertension   . Impaired fasting glucose    Dr. Zanard,PCP -told pt Hgb A1C level was good.  . LSIL (low grade squamous intraepithelial lesion) on Pap smear   . Morbid obesity (HCC)   . Osteoarthritis    Bilateral knees  . Osteoarthritis   . Pneumonia 2016  . PONV (postoperative nausea and vomiting)   . Pre-diabetes    hgA1c on 06-09-17 was 5.7  . Primary cough headache   . Rosacea   . Swelling of limb   . Transfusion history    '88- s/p childbirth  . Trigeminal neuralgia   . URI (upper respiratory infection)    cough with green sputum since 11-26-15  . Vertigo    10 yrs ago- only-    Past Surgical History:  Procedure Laterality Date  . CHOLECYSTECTOMY     history of gallstones  . LEEP    . right knee meniscus repair  2014  . SHOULDER OPEN ROTATOR CUFF REPAIR Right 12/01/2015   Procedure: RIGHT SHOULDER MINI OPEN ROTATOR CUFF REPAIR WITH RESECTON OF DISTAL CLAVICLE AND SUBACROMIAL DECOMPRESSION;  Surgeon: Jeffrey Beane, MD;   Location: [facility];  Service: Orthopedics;  Laterality: Right;  . TOTAL KNEE ARTHROPLASTY Right 06/13/2016   Procedure: RIGHT TOTAL KNEE ARTHROPLASTY;  Surgeon: Jeffrey Beane, MD;  Location: [facility];  Service: Orthopedics;  Laterality: Right;  . TOTAL KNEE ARTHROPLASTY Left 07/16/2017   Procedure: LEFT TOTAL KNEE ARTHROPLASTY;  Surgeon: Beane, Jeffrey, MD;  Location: [facility];  Service: Orthopedics;  Laterality: Left;  2.5 hrs    There were no vitals filed for this visit.  Subjective Assessment - 09/19/17 0940    Subjective  Megan Petersen reports that she is sore from last rehab session. Had sore stabbing pains in the knee caps during the night awakening her again the past two nights. Working on exercises at home. Feels the US helps loosen the scar and knee cap area.     Currently in Pain?  Yes    Pain Score  3     Pain Location  Knee    Pain Orientation  Left    Pain Descriptors / Indicators  Dull    Pain Type  Surgical pain    Pain Onset  More than a month ago    Pain Frequency  Intermittent      Aggravating Factors   bending knee     Pain Relieving Factors  ice, meds, gentle exercise                       [facility] Adult PT Treatment/Exercise - 09/19/17 0001      Knee/Hip Exercises: Stretches   Passive Hamstring Stretch  Left;3 reps;30 seconds supine with strap     ITB Stretch  Left;3 reps;30 seconds supine with strap       Knee/Hip Exercises: Aerobic   Stationary Bike  L3 x 6 min       Knee/Hip Exercises: Standing   Hip Abduction  AROM;Right;Left;2 sets;10 reps;Knee straight green TB    Hip Extension  Stengthening;Right;Left;2 sets;10 reps;Knee straight green TB     Lateral Step Up  Left;2 sets;10 reps;Step Height: 4";Hand Hold: 1    Forward Step Up  Left;2 sets;10 reps;Step Height: 6";Hand Hold: 1    Step Down  Right;2 sets;10 reps;Hand Hold: 2;Step Height: 4"    SLS  5x20 sec Lt LE with 0n blue theracushion HHA as needed       Electrical Stimulation   Electrical  Stimulation Location  Lt knee     Electrical Stimulation Action  ion repelling     Electrical Stimulation Parameters  to tolerance    Electrical Stimulation Goals  Edema;Pain      Ultrasound   Ultrasound Location  Lt knee through distal quad and incision     Ultrasound Parameters  1.5 w/cm2; 1 mHz; 100%; 8 min     Ultrasound Goals  Pain;Other (Comment) tightness       Vasopneumatic   Number Minutes Vasopneumatic   15 minutes    Vasopnuematic Location   Knee    Vasopneumatic Pressure  Medium    Vasopneumatic Temperature   3*      Manual Therapy   Manual therapy comments   scar tissue cross friction    Soft tissue mobilization  scar massage     Kinesiotex  Edema basket weave anterior knee                PT Short Term Goals - 08/06/17 1015      PT SHORT TERM GOAL #1   Period  Days        PT Long Term Goals - 09/17/17 0939      PT LONG TERM GOAL #1   Title  Independent with advanced HEP (10/10/17)    Status  On-going      PT LONG TERM GOAL #2   Title  improve Lt Knee ROM -4 extension to 118 flexion ( 10/10/17)      Status  Partially Met met for flexion, not extension      PT LONG TERM GOAL #4   Title  improve gait pattern with minimal limp with improved gait pattern with walking (10/10/17)      Status  On-going      PT LONG TERM GOAL #5   Title  Maintain FOTO =/< 52% limited, CK level  ( 09/12/17)      Status  On-going            Plan - 09/19/17 0944    Clinical Impression Statement  Megan Petersen continues to progress with knee rehab. She is gaining ROM and flexibilty through Lt knee. Working on strengthening.     Rehab Potential  Excellent    PT Frequency  2x / week    PT Duration  12 weeks      PT Treatment/Interventions  Gait training;Neuromuscular re-education;Manual techniques;Functional mobility training;Moist Heat;Ultrasound;Therapeutic activities;Patient/family education;Taping;Vasopneumatic Device;Therapeutic exercise;Cryotherapy;Electrical  Stimulation;Passive range of motion    PT Next Visit Plan  continue with knee rehab as tolerated; continue US as indicated for tightness     Consulted and Agree with Plan of Care  Patient       Patient will benefit from skilled therapeutic intervention in order to improve the following deficits and impairments:  Abnormal gait, Pain, Decreased mobility, Decreased scar mobility, Increased muscle spasms, Decreased strength, Decreased range of motion, Decreased endurance, Decreased balance, Difficulty walking, Increased edema  Visit Diagnosis: Muscle weakness (generalized)  Localized edema  Stiffness of left knee, not elsewhere classified  Acute pain of left knee  Other abnormalities of gait and mobility     Problem List Patient Active Problem List   Diagnosis Date Noted  . Primary osteoarthritis of left knee 07/16/2017  . Right knee DJD 06/13/2016  . Complete rotator cuff tear 12/01/2015  . Wheezing 04/07/2014  . Impaired fasting glucose 07/26/2013  . Essential hypertension, benign 07/26/2013  . Back pain 07/26/2013  . Knee pain, bilateral 01/10/2013  . Morbid obesity (HCC) 01/10/2013  . Headache(784.0) 12/06/2012     P  PT, MPH  09/19/2017, 10:24 AM  [facility] [address] [place], [place], [address] Phone: [phone]   Fax:  [phone]  Name: Megan Petersen MRN: [id number] Date of Birth: 05/03/1961   

## 2017-09-22 ENCOUNTER — Ambulatory Visit (INDEPENDENT_AMBULATORY_CARE_PROVIDER_SITE_OTHER): Payer: BLUE CROSS/BLUE SHIELD | Admitting: Rehabilitative and Restorative Service Providers"

## 2017-09-22 ENCOUNTER — Encounter: Payer: Self-pay | Admitting: Rehabilitative and Restorative Service Providers"

## 2017-09-22 DIAGNOSIS — M25662 Stiffness of left knee, not elsewhere classified: Secondary | ICD-10-CM | POA: Diagnosis not present

## 2017-09-22 DIAGNOSIS — M6281 Muscle weakness (generalized): Secondary | ICD-10-CM | POA: Diagnosis not present

## 2017-09-22 DIAGNOSIS — R600 Localized edema: Secondary | ICD-10-CM

## 2017-09-22 DIAGNOSIS — M25562 Pain in left knee: Secondary | ICD-10-CM

## 2017-09-22 NOTE — Therapy (Signed)
[place], [place], [address] Phone: [phone]   Fax:  [phone]  Physical Therapy Treatment  Patient Details  Name: Megan Petersen MRN: [id number] Date of Birth: [birth year] Referring Provider: Dr Susa Day   Encounter Date: 09/22/2017  PT End of Session - 09/22/17 0937    Visit Number  16    Number of Visits  18    Date for PT Re-Evaluation  10/10/17    PT Start Time  0930    PT Stop Time  1028    PT Time Calculation (min)  58 min    Activity Tolerance  Patient tolerated treatment well       Past Medical History:  Diagnosis Date  . Allergy   . Asthma   . Atypical glandular cells on Pap smear   . Esophageal reflux   . Family history of adverse reaction to anesthesia    brother slow to awaken  . GERD (gastroesophageal reflux disease)    Nexium controls  . Headache   . Hemorrhoids    history of- not a problem at present.  . Hyperlipidemia   . Hypertension   . Impaired fasting glucose    Dr. Darcella Cheshire -told pt Hgb A1C level was good.  Marland Kitchen LSIL (low grade squamous intraepithelial lesion) on Pap smear   . Morbid obesity ([place])   . Osteoarthritis    Bilateral knees  . Osteoarthritis   . Pneumonia 2016  . PONV (postoperative nausea and vomiting)   . Pre-diabetes    hgA1c on 06-09-17 was 5.7  . Primary cough headache   . Rosacea   . Swelling of limb   . Transfusion history    '88- s/p childbirth  . Trigeminal neuralgia   . URI (upper respiratory infection)    cough with green sputum since 11-26-15  . Vertigo    10 yrs ago- only-    Past Surgical History:  Procedure Laterality Date  . CHOLECYSTECTOMY     history of gallstones  . LEEP    . right knee meniscus repair  2014  . SHOULDER OPEN ROTATOR CUFF REPAIR Right 12/01/2015   Procedure: RIGHT SHOULDER MINI OPEN ROTATOR CUFF REPAIR WITH RESECTON OF DISTAL CLAVICLE AND SUBACROMIAL DECOMPRESSION;  Surgeon: Susa Day, MD;   [facility]: [facility];  Service: Orthopedics;  Laterality: Right;  . TOTAL KNEE ARTHROPLASTY Right 06/13/2016   Procedure: RIGHT TOTAL KNEE ARTHROPLASTY;  Surgeon: Susa Day, MD;  [facility]: [facility];  Service: Orthopedics;  Laterality: Right;  . TOTAL KNEE ARTHROPLASTY Left 07/16/2017   Procedure: LEFT TOTAL KNEE ARTHROPLASTY;  Surgeon: Susa Day, MD;  [facility]: [facility];  Service: Orthopedics;  Laterality: Left;  2.5 hrs    There were no vitals filed for this visit.  Subjective Assessment - 09/22/17 0936    Subjective  [name] reports that she continues to improve. She feels the [place] and taping help with the tightness. Having some pain in the arch of the Lt foot. Has some custom inserts but they increase pain in the arch.     Pertinent History  Rt TKA 05/2016, MRI Rt foot due to pain - has multiple things wrong with it and seeing MD for this.  Pt reports it stays swollen.     Patient Stated Goals  bend the knee like her Rt     Currently in Pain?  No/denies         [facility] PT Assessment - 09/22/17 0001  Assessment   Medical Diagnosis  Lt TKA    Referring Provider  Dr Susa Day    Onset Date/Surgical Date  07/16/17    Hand Dominance  Right    Next MD Visit  10/08/17    Prior Therapy  not for this knee      AROM   Left Knee Extension  -6    Left Knee Flexion  118                  [facility] Adult PT Treatment/Exercise - 09/22/17 0001      Knee/Hip Exercises: Stretches   Passive Hamstring Stretch  Left;3 reps;30 seconds supine with strap     ITB Stretch  Left;3 reps;30 seconds supine with strap       Knee/Hip Exercises: Aerobic   Stationary Bike  L3 x 6 min       Knee/Hip Exercises: Standing   Lateral Step Up  Left;2 sets;10 reps;Step Height: 4";Hand Hold: 1    Forward Step Up  Left;2 sets;10 reps;Step Height: 6";Hand Hold: 1    Step Down  Right;2 sets;10 reps;Hand Hold: 2;Step Height: 4"    Walking with Sports Cord  backwards x 5; each side x 5       Knee/Hip  Exercises: Supine   Bridges  Strengthening;Both;10 reps    Single Leg Bridge  Strengthening;Left;2 sets;10 reps      Press photographer Stimulation Parameters  to tolerance    Electrical Stimulation Goals  Edema;Pain      Ultrasound   Ultrasound Location  Lt knee anteriorly through scar and peripatellar areas     Ultrasound Parameters  1.2 w/ms2; 1 mHz; 100%; 8 min     Ultrasound Goals  Pain;Other (Comment) tightness       Vasopneumatic   Number Minutes Vasopneumatic   15 minutes    Vasopnuematic Location   Knee    Vasopneumatic Pressure  Medium    Vasopneumatic Temperature   3*      Manual Therapy   Manual therapy comments   scar tissue cross friction    Soft tissue mobilization  scar massage     Kinesiotex  Edema basket weave anterior knee                PT Short Term Goals - 08/06/17 1015      PT SHORT TERM GOAL #1   Period  Days        PT Long Term Goals - 09/22/17 1505      PT LONG TERM GOAL #1   Title  Independent with advanced HEP (10/10/17)    Time  10    Period  Weeks    Status  On-going      PT LONG TERM GOAL #2   Title  improve Lt Knee ROM -4 extension to 118 flexion ( 10/10/17)      Time  10    Period  Weeks    Status  Partially Met      PT LONG TERM GOAL #3   Title  demo Lt LE strength =/> 5-/5 to allow alternating gait on stairs (10/10/17)    Time  10    Period  Weeks    Status  On-going      PT LONG TERM GOAL #4   Title  improve gait pattern with minimal limp with improved gait  pattern with walking (10/10/17)      Time  10    Period  Weeks    Status  On-going      PT LONG TERM GOAL #5   Title  Maintain FOTO =/< 52% limited, CK level  ( 09/12/17)      Time  10    Period  Weeks    Status  On-going            Plan - 09/22/17 0941    Clinical Impression Statement  Strength, ROM, mobility, gait continue to improve. Pain  is decreased. Patient is progressing well toward stated goals of therapy.     Rehab Potential  Excellent    PT Frequency  2x / week    PT Duration  12 weeks    PT Treatment/Interventions  Gait training;Neuromuscular re-education;Manual techniques;Functional mobility training;Moist Heat;Ultrasound;Therapeutic activities;Patient/family education;Taping;Vasopneumatic Device;Therapeutic exercise;Cryotherapy;Electrical Stimulation;Passive range of motion    PT Next Visit Plan  continue with knee rehab as tolerated; continue [place] as indicated for tightness     Consulted and Agree with Plan of Care  Patient       Patient will benefit from skilled therapeutic intervention in order to improve the following deficits and impairments:  Abnormal gait, Pain, Decreased mobility, Decreased scar mobility, Increased muscle spasms, Decreased strength, Decreased range of motion, Decreased endurance, Decreased balance, Difficulty walking, Increased edema  Visit Diagnosis: Muscle weakness (generalized)  Localized edema  Stiffness of left knee, not elsewhere classified  Acute pain of left knee     Problem List Patient Active Problem List   Diagnosis Date Noted  . Primary osteoarthritis of left knee 07/16/2017  . Right knee DJD 06/13/2016  . Complete rotator cuff tear 12/01/2015  . Wheezing 04/07/2014  . Impaired fasting glucose 07/26/2013  . Essential hypertension, benign 07/26/2013  . Back pain 07/26/2013  . Knee pain, bilateral 01/10/2013  . Morbid obesity ([place]) 01/10/2013  . Headache(784.0) 12/06/2012     Nilda Simmer PT, MPH  09/22/2017, 10:22 AM  [facility] [place], [place], [address] Phone: [phone]   Fax:  [phone]  Name: SHADAE REINO MRN: [id number] Date of Birth: 02-11-1961

## 2017-09-25 ENCOUNTER — Ambulatory Visit (INDEPENDENT_AMBULATORY_CARE_PROVIDER_SITE_OTHER): Payer: BLUE CROSS/BLUE SHIELD | Admitting: Physical Therapy

## 2017-09-25 DIAGNOSIS — M25662 Stiffness of left knee, not elsewhere classified: Secondary | ICD-10-CM

## 2017-09-25 DIAGNOSIS — M6281 Muscle weakness (generalized): Secondary | ICD-10-CM | POA: Diagnosis not present

## 2017-09-25 DIAGNOSIS — R2689 Other abnormalities of gait and mobility: Secondary | ICD-10-CM

## 2017-09-25 DIAGNOSIS — M25562 Pain in left knee: Secondary | ICD-10-CM | POA: Diagnosis not present

## 2017-09-25 DIAGNOSIS — R600 Localized edema: Secondary | ICD-10-CM

## 2017-09-25 NOTE — Therapy (Signed)
[place], [place], [address] Phone: [phone]   Fax:  [phone]  Physical Therapy Treatment  Patient Details  Name: Megan Petersen MRN: [id number] Date of Birth: 1961-04-15 Referring Provider: Dr Susa Day   Encounter Date: 09/25/2017  PT End of Session - 09/25/17 0937    Visit Number  17    Number of Visits  18    Date for PT Re-Evaluation  10/10/17    PT Start Time  0937    PT Stop Time  1028    PT Time Calculation (min)  51 min    Activity Tolerance  Patient tolerated treatment well       Past Medical History:  Diagnosis Date  . Allergy   . Asthma   . Atypical glandular cells on Pap smear   . Esophageal reflux   . Family history of adverse reaction to anesthesia    brother slow to awaken  . GERD (gastroesophageal reflux disease)    Nexium controls  . Headache   . Hemorrhoids    history of- not a problem at present.  . Hyperlipidemia   . Hypertension   . Impaired fasting glucose    Dr. Darcella Cheshire -told pt Hgb A1C level was good.  Marland Kitchen LSIL (low grade squamous intraepithelial lesion) on Pap smear   . Morbid obesity ([place])   . Osteoarthritis    Bilateral knees  . Osteoarthritis   . Pneumonia 2016  . PONV (postoperative nausea and vomiting)   . Pre-diabetes    hgA1c on 06-09-17 was 5.7  . Primary cough headache   . Rosacea   . Swelling of limb   . Transfusion history    '88- s/p childbirth  . Trigeminal neuralgia   . URI (upper respiratory infection)    cough with green sputum since 11-26-15  . Vertigo    10 yrs ago- only-    Past Surgical History:  Procedure Laterality Date  . CHOLECYSTECTOMY     history of gallstones  . LEEP    . right knee meniscus repair  2014  . SHOULDER OPEN ROTATOR CUFF REPAIR Right 12/01/2015   Procedure: RIGHT SHOULDER MINI OPEN ROTATOR CUFF REPAIR WITH RESECTON OF DISTAL CLAVICLE AND SUBACROMIAL DECOMPRESSION;  Surgeon: Susa Day, MD;   Location[facility];  Service: Orthopedics;  Laterality: Right;  . TOTAL KNEE ARTHROPLASTY Right 06/13/2016   Procedure: RIGHT TOTAL KNEE ARTHROPLASTY;  Surgeon: Susa Day, MD;  [facility];  Service: Orthopedics;  Laterality: Right;  . TOTAL KNEE ARTHROPLASTY Left 07/16/2017   Procedure: LEFT TOTAL KNEE ARTHROPLASTY;  Surgeon: Susa Day, MD;  [facility];  Service: Orthopedics;  Laterality: Left;  2.5 hrs    There were no vitals filed for this visit.  Subjective Assessment - 09/25/17 0939    Subjective  Feeling stiff today due to the cold outside.     Currently in Pain?  No/denies                      [facility] Adult PT Treatment/Exercise - 09/25/17 0001      Ambulation/Gait   Gait Comments  line drills Lt LE to work on heel toe pattern and decreaing ER       Knee/Hip Exercises: Stretches   Active Hamstring Stretch  Left with 30 reps knee presses      Knee/Hip Exercises: Aerobic   Stationary Bike  L3 x 6 min  Knee/Hip Exercises: Standing   Other Standing Knee Exercises  BWD wt shifting to Lt LE focus on knee extension       Knee/Hip Exercises: Supine   Quad Sets  Strengthening;Left;20 reps      Modalities   Modalities  Electrical Stimulation;Vasopneumatic      Acupuncturist Location  Lt knee     Scientific laboratory technician Parameters  to tolerance    Electrical Stimulation Goals  Edema;Pain      Vasopneumatic   Number Minutes Vasopneumatic   15 minutes    Vasopnuematic Location   Knee    Vasopneumatic Pressure  Medium    Vasopneumatic Temperature   3*               PT Short Term Goals - 08/06/17 1015      PT SHORT TERM GOAL #1   Period  Days        PT Long Term Goals - 09/22/17 3545      PT LONG TERM GOAL #1   Title  Independent with advanced HEP (10/10/17)    Time  10    Period  Weeks    Status  On-going      PT LONG TERM GOAL #2    Title  improve Lt Knee ROM -4 extension to 118 flexion ( 10/10/17)      Time  10    Period  Weeks    Status  Partially Met      PT LONG TERM GOAL #3   Title  demo Lt LE strength =/> 5-/5 to allow alternating gait on stairs (10/10/17)    Time  10    Period  Weeks    Status  On-going      PT LONG TERM GOAL #4   Title  improve gait pattern with minimal limp with improved gait pattern with walking (10/10/17)      Time  10    Period  Weeks    Status  On-going      PT LONG TERM GOAL #5   Title  Maintain FOTO =/< 52% limited, CK level  ( 09/12/17)      Time  10    Period  Weeks    Status  On-going            Plan - 09/25/17 0955    Clinical Impression Statement  Megan Petersen is improving with her mobility, recommend she stop using her cane for ambulation. Her knee extension is limited and causes some antagic gait.  She is working on getting this motion back.     Rehab Potential  Excellent    PT Frequency  2x / week    PT Duration  12 weeks    PT Treatment/Interventions  Gait training;Neuromuscular re-education;Manual techniques;Functional mobility training;Moist Heat;Ultrasound;Therapeutic activities;Patient/family education;Taping;Vasopneumatic Device;Therapeutic exercise;Cryotherapy;Electrical Stimulation;Passive range of motion    PT Next Visit Plan  continue with knee rehab as tolerated; continue [place] as indicated for tightness     Consulted and Agree with Plan of Care  Patient       Patient will benefit from skilled therapeutic intervention in order to improve the following deficits and impairments:  Abnormal gait, Pain, Decreased mobility, Decreased scar mobility, Increased muscle spasms, Decreased strength, Decreased range of motion, Decreased endurance, Decreased balance, Difficulty walking, Increased edema  Visit Diagnosis: Muscle weakness (generalized)  Localized edema  Stiffness of left knee, not elsewhere classified  Acute pain  of left knee  Other abnormalities of gait  and mobility     Problem List Patient Active Problem List   Diagnosis Date Noted  . Primary osteoarthritis of left knee 07/16/2017  . Right knee DJD 06/13/2016  . Complete rotator cuff tear 12/01/2015  . Wheezing 04/07/2014  . Impaired fasting glucose 07/26/2013  . Essential hypertension, benign 07/26/2013  . Back pain 07/26/2013  . Knee pain, bilateral 01/10/2013  . Morbid obesity ([place]) 01/10/2013  . Headache(784.0) 12/06/2012    Manuela Schwartz Macee Venables PT 09/25/2017, 10:09 AM  [facility] [place], [place], [address] Phone: [phone]   Fax:  [phone]  Name: Megan Petersen MRN: [id number] Date of Birth: 03/25/1961

## 2017-09-29 ENCOUNTER — Encounter: Payer: Self-pay | Admitting: Rehabilitative and Restorative Service Providers"

## 2017-09-29 ENCOUNTER — Ambulatory Visit (INDEPENDENT_AMBULATORY_CARE_PROVIDER_SITE_OTHER): Payer: BLUE CROSS/BLUE SHIELD | Admitting: Rehabilitative and Restorative Service Providers"

## 2017-09-29 DIAGNOSIS — M25562 Pain in left knee: Secondary | ICD-10-CM

## 2017-09-29 DIAGNOSIS — R600 Localized edema: Secondary | ICD-10-CM

## 2017-09-29 DIAGNOSIS — R2689 Other abnormalities of gait and mobility: Secondary | ICD-10-CM | POA: Diagnosis not present

## 2017-09-29 DIAGNOSIS — M25662 Stiffness of left knee, not elsewhere classified: Secondary | ICD-10-CM

## 2017-09-29 DIAGNOSIS — M6281 Muscle weakness (generalized): Secondary | ICD-10-CM | POA: Diagnosis not present

## 2017-09-29 NOTE — Therapy (Signed)
[facility] [address] [place], [place], [address] Phone: [phone]   Fax:  [phone]  Physical Therapy Treatment  Patient Details  Name: Megan Petersen MRN: [id number] Date of Birth: [birth year] Referring Provider: Dr Jene EveryJeffrey Beane   Encounter Date: 09/29/2017  PT End of Session - 09/29/17 0812    Visit Number  18    Number of Visits  20    Date for PT Re-Evaluation  11/10/17    PT Start Time  0800    PT Stop Time  0856    PT Time Calculation (min)  56 min    Activity Tolerance  Patient tolerated treatment well       Past Medical History:  Diagnosis Date  . Allergy   . Asthma   . Atypical glandular cells on Pap smear   . Esophageal reflux   . Family history of adverse reaction to anesthesia    brother slow to awaken  . GERD (gastroesophageal reflux disease)    Nexium controls  . Headache   . Hemorrhoids    history of- not a problem at present.  . Hyperlipidemia   . Hypertension   . Impaired fasting glucose    Dr. Jenny ReichmannZanard,PCP -told pt Hgb A1C level was good.  Marland Kitchen. LSIL (low grade squamous intraepithelial lesion) on Pap smear   . Morbid obesity (HCC)   . Osteoarthritis    Bilateral knees  . Osteoarthritis   . Pneumonia 2016  . PONV (postoperative nausea and vomiting)   . Pre-diabetes    hgA1c on 06-09-17 was 5.7  . Primary cough headache   . Rosacea   . Swelling of limb   . Transfusion history    '88- s/p childbirth  . Trigeminal neuralgia   . URI (upper respiratory infection)    cough with green sputum since 11-26-15  . Vertigo    10 yrs ago- only-    Past Surgical History:  Procedure Laterality Date  . CHOLECYSTECTOMY     history of gallstones  . LEEP    . right knee meniscus repair  2014  . SHOULDER OPEN ROTATOR CUFF REPAIR Right 12/01/2015   Procedure: RIGHT SHOULDER MINI OPEN ROTATOR CUFF REPAIR WITH RESECTON OF DISTAL CLAVICLE AND SUBACROMIAL DECOMPRESSION;  Surgeon: Jene EveryJeffrey Beane, MD;   Location: [facility];  Service: Orthopedics;  Laterality: Right;  . TOTAL KNEE ARTHROPLASTY Right 06/13/2016   Procedure: RIGHT TOTAL KNEE ARTHROPLASTY;  Surgeon: Jene EveryJeffrey Beane, MD;  Location: [facility];  Service: Orthopedics;  Laterality: Right;  . TOTAL KNEE ARTHROPLASTY Left 07/16/2017   Procedure: LEFT TOTAL KNEE ARTHROPLASTY;  Surgeon: Jene EveryBeane, Jeffrey, MD;  Location: [facility];  Service: Orthopedics;  Laterality: Left;  2.5 hrs    There were no vitals filed for this visit.      [facility] - 09/29/17 0001      Assessment   Medical Diagnosis  Lt TKA    Referring Provider  Dr Jene EveryJeffrey Beane    Onset Date/Surgical Date  07/16/17    Hand Dominance  Right    Next MD Visit  10/08/17      AROM   Left Knee Extension  -5    Left Knee Flexion  115      Strength   Left Hip Flexion  -- 5-/5    Left Hip ABduction  4+/5    Right/Left Knee  -- Lt 5-/5     Right/Left Ankle  -- WNL      Palpation  Patella mobility   decreased hypomobile                  [facility] Adult PT Treatment/Exercise - 09/29/17 0001      Knee/Hip Exercises: Stretches   Passive Hamstring Stretch  Left;3 reps;30 seconds supine with strap     ITB Stretch  Left;3 reps;30 seconds supine with strap     Other Knee/Hip Stretches  adductor stretch supine with strap 30 sec x 3       Knee/Hip Exercises: Aerobic   Stationary Bike  L3 x 6 min       Knee/Hip Exercises: Standing   Hip Flexion  AROM;Left;2 sets;10 reps;Knee bent    Lateral Step Up  Left;2 sets;10 reps;Step Height: 4";Hand Hold: 1    Forward Step Up  Left;2 sets;10 reps;Step Height: 6";Hand Hold: 1    Step Down  Right;2 sets;10 reps;Hand Hold: 2;Step Height: 4"      Knee/Hip Exercises: Supine   Quad Sets  Strengthening;Left;20 reps heel on foam roll     Short Arc Quad Sets  Strengthening;Left;2 sets;10 reps      Knee/Hip Exercises: Sidelying   Hip ABduction  AROM;Strengthening;Left;20 reps      Geologist, engineering Parameters  to tolerance    Electrical Stimulation Goals  Edema;Pain      Vasopneumatic   Number Minutes Vasopneumatic   15 minutes    Vasopnuematic Location   Knee    Vasopneumatic Pressure  Medium    Vasopneumatic Temperature   3*      Manual Therapy   Manual therapy comments  assisted knee extension 20 sec hold x 3 reps                PT Short Term Goals - 08/06/17 1015      PT SHORT TERM GOAL #1   Period  Days        PT Long Term Goals - 09/29/17 0815      PT LONG TERM GOAL #1   Title  Independent with advanced HEP (11/10/17)    Time  16    Period  Weeks    Status  Revised      PT LONG TERM GOAL #2   Title  improve Lt Knee ROM -4 extension to 118 flexion (11/10/17)      Time  16    Period  Weeks    Status  Revised      PT LONG TERM GOAL #3   Title  demo Lt LE strength =/> 5-/5 to allow alternating gait on stairs (11/10/17)    Time  16    Period  Weeks    Status  Revised      PT LONG TERM GOAL #4   Title  improve gait pattern with minimal limp with improved gait pattern with walking without assistive device  (11/10/17)      Time  16    Period  Weeks    Status  Revised      PT LONG TERM GOAL #5   Title  Maintain FOTO =/< 52% limited, CK level  ( 11/10/17)      Time  16    Period  Weeks    Status  Revised            Plan - 09/29/17 0813    Clinical Impression Statement  Megan Petersen continues to progress well with post op Lt TKA. She continues to have some limited ROM at end ranges. ROM and strength continue to gradually improve. She is walking without the cane and doing well. She has resumed most functional activities.     Rehab Potential  Excellent    PT Frequency  2x / week    PT Duration  12 weeks    PT Treatment/Interventions  Gait training;Neuromuscular re-education;Manual techniques;Functional mobility training;Moist Heat;Ultrasound;Therapeutic  activities;Patient/family education;Taping;Vasopneumatic Device;Therapeutic exercise;Cryotherapy;Electrical Stimulation;Passive range of motion    PT Next Visit Plan  continue with knee rehab as tolerated; continue [place] as indicated for tightness     Consulted and Agree with Plan of Care  Patient       Patient will benefit from skilled therapeutic intervention in order to improve the following deficits and impairments:  Abnormal gait, Pain, Decreased mobility, Decreased scar mobility, Increased muscle spasms, Decreased strength, Decreased range of motion, Decreased endurance, Decreased balance, Difficulty walking, Increased edema  Visit Diagnosis: Muscle weakness (generalized) - Plan: PT plan of care cert/re-cert  Localized edema - Plan: PT plan of care cert/re-cert  Stiffness of left knee, not elsewhere classified - Plan: PT plan of care cert/re-cert  Acute pain of left knee - Plan: PT plan of care cert/re-cert  Other abnormalities of gait and mobility - Plan: PT plan of care cert/re-cert     Problem List Patient Active Problem List   Diagnosis Date Noted  . Primary osteoarthritis of left knee 07/16/2017  . Right knee DJD 06/13/2016  . Complete rotator cuff tear 12/01/2015  . Wheezing 04/07/2014  . Impaired fasting glucose 07/26/2013  . Essential hypertension, benign 07/26/2013  . Back pain 07/26/2013  . Knee pain, bilateral 01/10/2013  . Morbid obesity (HCC) 01/10/2013  . Headache(784.0) 12/06/2012    Bhargav Barbaro Rober Minion PT, MPH  09/29/2017, 11:18 AM  [facility] [address] [place], [place], [address] Phone: [phone]   Fax:  [phone]  Name: Megan Petersen MRN: [id number] Date of Birth: [birth year]

## 2017-10-02 ENCOUNTER — Encounter: Payer: Self-pay | Admitting: Rehabilitative and Restorative Service Providers"

## 2017-10-02 ENCOUNTER — Ambulatory Visit (INDEPENDENT_AMBULATORY_CARE_PROVIDER_SITE_OTHER): Payer: BLUE CROSS/BLUE SHIELD | Admitting: Rehabilitative and Restorative Service Providers"

## 2017-10-02 DIAGNOSIS — R600 Localized edema: Secondary | ICD-10-CM | POA: Diagnosis not present

## 2017-10-02 DIAGNOSIS — M25662 Stiffness of left knee, not elsewhere classified: Secondary | ICD-10-CM | POA: Diagnosis not present

## 2017-10-02 DIAGNOSIS — M6281 Muscle weakness (generalized): Secondary | ICD-10-CM

## 2017-10-02 DIAGNOSIS — M25562 Pain in left knee: Secondary | ICD-10-CM | POA: Diagnosis not present

## 2017-10-02 NOTE — Therapy (Signed)
[facility] [address] [place], [place], [address] Phone: [phone]   Fax:  [phone]  Physical Therapy Treatment  Patient Details  Name: Megan Petersen MRN: [id number] Date of Birth: September 09, 1960 Referring Provider: Dr Jene Every   Encounter Date: 10/02/2017  PT End of Session - 10/02/17 0814    Visit Number  19    Number of Visits  20    Date for PT Re-Evaluation  11/10/17    PT Start Time  0755    PT Stop Time  0851    PT Time Calculation (min)  56 min    Activity Tolerance  Patient tolerated treatment well       Past Medical History:  Diagnosis Date  . Allergy   . Asthma   . Atypical glandular cells on Pap smear   . Esophageal reflux   . Family history of adverse reaction to anesthesia    brother slow to awaken  . GERD (gastroesophageal reflux disease)    Nexium controls  . Headache   . Hemorrhoids    history of- not a problem at present.  . Hyperlipidemia   . Hypertension   . Impaired fasting glucose    Dr. Jenny Reichmann -told pt Hgb A1C level was good.  Marland Kitchen LSIL (low grade squamous intraepithelial lesion) on Pap smear   . Morbid obesity (HCC)   . Osteoarthritis    Bilateral knees  . Osteoarthritis   . Pneumonia 2016  . PONV (postoperative nausea and vomiting)   . Pre-diabetes    hgA1c on 06-09-17 was 5.7  . Primary cough headache   . Rosacea   . Swelling of limb   . Transfusion history    '88- s/p childbirth  . Trigeminal neuralgia   . URI (upper respiratory infection)    cough with green sputum since 11-26-15  . Vertigo    10 yrs ago- only-    Past Surgical History:  Procedure Laterality Date  . CHOLECYSTECTOMY     history of gallstones  . LEEP    . right knee meniscus repair  2014  . SHOULDER OPEN ROTATOR CUFF REPAIR Right 12/01/2015   Procedure: RIGHT SHOULDER MINI OPEN ROTATOR CUFF REPAIR WITH RESECTON OF DISTAL CLAVICLE AND SUBACROMIAL DECOMPRESSION;  Surgeon: Jene Every, MD;   [facility];  Service: Orthopedics;  Laterality: Right;  . TOTAL KNEE ARTHROPLASTY Right 06/13/2016   Procedure: RIGHT TOTAL KNEE ARTHROPLASTY;  Surgeon: Jene Every, MD;  [facility];  Service: Orthopedics;  Laterality: Right;  . TOTAL KNEE ARTHROPLASTY Left 07/16/2017   Procedure: LEFT TOTAL KNEE ARTHROPLASTY;  Surgeon: Jene Every, MD;  [facility]: [facility];  Service: Orthopedics;  Laterality: Left;  2.5 hrs    There were no vitals filed for this visit.  Subjective Assessment - 10/02/17 0853    Subjective  Knee feels stiff this am. Less stiff and tight following [place] and scar massage. She is doing well walkiing without cane. Continues to on HEP                      [facility] Adult PT Treatment/Exercise - 10/02/17 0001      Knee/Hip Exercises: Stretches   Passive Hamstring Stretch  Left;3 reps;30 seconds supine with strap     ITB Stretch  Left;3 reps;30 seconds supine with strap     Other Knee/Hip Stretches  adductor stretch supine with strap 30 sec x 3       Knee/Hip Exercises: Aerobic  Stationary Bike  L3 x 6 min       Knee/Hip Exercises: Standing   Lateral Step Up  Left;2 sets;10 reps;Step Height: 4";Hand Hold: 1    Forward Step Up  Left;2 sets;10 reps;Step Height: 6";Hand Hold: 1    Step Down  Right;2 sets;10 reps;Hand Hold: 2;Step Height: 4"    SLS  5x20 sec Lt LE with 0n blue theracushion HHA as needed       Knee/Hip Exercises: Seated   Other Seated Knee/Hip Exercises  sitting knee bent scooting forward hold 30 sec x 5     Sit to Sand  10 reps;without UE support      Knee/Hip Exercises: Supine   Quad Sets  Strengthening;Left;20 reps heel on foam roll       Knee/Hip Exercises: Sidelying   Hip ABduction  AROM;Strengthening;Left;20 reps tap forward and back 3 taps each x 5 reps toe down       Electrical Stimulation   Electrical Stimulation Location  Lt knee     Electrical Stimulation Action  ion repelling     Electrical Stimulation Parameters  to  tolerance    Electrical Stimulation Goals  Edema;Pain      Ultrasound   Ultrasound Location  anterior knee; distal quad Lt     Ultrasound Parameters  1.5 w/cm2; 1 mHz; 100%; 8 min     Ultrasound Goals  Pain;Other (Comment) tightness       Vasopneumatic   Number Minutes Vasopneumatic   15 minutes    Vasopnuematic Location   Knee    Vasopneumatic Pressure  Medium    Vasopneumatic Temperature   3*      Manual Therapy   Manual therapy comments  assisted knee extension 20 sec hold x 3 reps     Soft tissue mobilization  scar massage                PT Short Term Goals - 08/06/17 1015      PT SHORT TERM GOAL #1   Period  Days        PT Long Term Goals - 09/29/17 0815      PT LONG TERM GOAL #1   Title  Independent with advanced HEP (11/10/17)    Time  16    Period  Weeks    Status  Revised      PT LONG TERM GOAL #2   Title  improve Lt Knee ROM -4 extension to 118 flexion (11/10/17)      Time  16    Period  Weeks    Status  Revised      PT LONG TERM GOAL #3   Title  demo Lt LE strength =/> 5-/5 to allow alternating gait on stairs (11/10/17)    Time  16    Period  Weeks    Status  Revised      PT LONG TERM GOAL #4   Title  improve gait pattern with minimal limp with improved gait pattern with walking without assistive device  (11/10/17)      Time  16    Period  Weeks    Status  Revised      PT LONG TERM GOAL #5   Title  Maintain FOTO =/< 52% limited, CK level  ( 11/10/17)      Time  16    Period  Weeks    Status  Revised            Plan - 10/02/17 [id number]  Clinical Impression Statement  Continued gradual progress with ROM, strength, function post op Lt TKA.     Rehab Potential  Excellent    PT Frequency  2x / week    PT Duration  12 weeks    PT Treatment/Interventions  Gait training;Neuromuscular re-education;Manual techniques;Functional mobility training;Moist Heat;Ultrasound;Therapeutic activities;Patient/family education;Taping;Vasopneumatic  Device;Therapeutic exercise;Cryotherapy;Electrical Stimulation;Passive range of motion    PT Next Visit Plan  continue with knee rehab as tolerated; continue [place] as indicated for tightness     Consulted and Agree with Plan of Care  Patient       Patient will benefit from skilled therapeutic intervention in order to improve the following deficits and impairments:  Abnormal gait, Pain, Decreased mobility, Decreased scar mobility, Increased muscle spasms, Decreased strength, Decreased range of motion, Decreased endurance, Decreased balance, Difficulty walking, Increased edema  Visit Diagnosis: Muscle weakness (generalized)  Localized edema  Stiffness of left knee, not elsewhere classified  Acute pain of left knee     Problem List Patient Active Problem List   Diagnosis Date Noted  . Primary osteoarthritis of left knee 07/16/2017  . Right knee DJD 06/13/2016  . Complete rotator cuff tear 12/01/2015  . Wheezing 04/07/2014  . Impaired fasting glucose 07/26/2013  . Essential hypertension, benign 07/26/2013  . Back pain 07/26/2013  . Knee pain, bilateral 01/10/2013  . Morbid obesity (HCC) 01/10/2013  . Headache(784.0) 12/06/2012    Aashir Umholtz Rober Minion PT, MPH  10/02/2017, 8:58 AM  [facility] [address] [place], [place], [address] Phone: [phone]   Fax:  [phone]  Name: Megan Petersen MRN: [id number] Date of Birth: 12/27/1960

## 2017-10-06 ENCOUNTER — Encounter: Payer: Self-pay | Admitting: Rehabilitative and Restorative Service Providers"

## 2017-10-06 ENCOUNTER — Ambulatory Visit (INDEPENDENT_AMBULATORY_CARE_PROVIDER_SITE_OTHER): Payer: BLUE CROSS/BLUE SHIELD | Admitting: Rehabilitative and Restorative Service Providers"

## 2017-10-06 DIAGNOSIS — M25662 Stiffness of left knee, not elsewhere classified: Secondary | ICD-10-CM

## 2017-10-06 DIAGNOSIS — R600 Localized edema: Secondary | ICD-10-CM | POA: Diagnosis not present

## 2017-10-06 DIAGNOSIS — M6281 Muscle weakness (generalized): Secondary | ICD-10-CM

## 2017-10-06 DIAGNOSIS — M25562 Pain in left knee: Secondary | ICD-10-CM | POA: Diagnosis not present

## 2017-10-06 NOTE — Therapy (Addendum)
[facility] [place], [place], [address] Phone: [phone]   Fax:  [phone]  Physical Therapy Treatment  Patient Details  Name: Megan Petersen MRN: [id number] Date of Birth: 1961-06-22 Referring Provider: Dr Susa Day   Encounter Date: 10/06/2017  PT End of Session - 10/06/17 0939    Visit Number  20    Number of Visits  32    Date for PT Re-Evaluation  11/17/17    PT Start Time  0925    PT Stop Time  1021    PT Time Calculation (min)  56 min    Activity Tolerance  Patient tolerated treatment well       Past Medical History:  Diagnosis Date  . Allergy   . Asthma   . Atypical glandular cells on Pap smear   . Esophageal reflux   . Family history of adverse reaction to anesthesia    brother slow to awaken  . GERD (gastroesophageal reflux disease)    Nexium controls  . Headache   . Hemorrhoids    history of- not a problem at present.  . Hyperlipidemia   . Hypertension   . Impaired fasting glucose    Dr. Darcella Cheshire -told pt Hgb A1C level was good.  Marland Kitchen LSIL (low grade squamous intraepithelial lesion) on Pap smear   . Morbid obesity ([place])   . Osteoarthritis    Bilateral knees  . Osteoarthritis   . Pneumonia 2016  . PONV (postoperative nausea and vomiting)   . Pre-diabetes    hgA1c on 06-09-17 was 5.7  . Primary cough headache   . Rosacea   . Swelling of limb   . Transfusion history    '88- s/p childbirth  . Trigeminal neuralgia   . URI (upper respiratory infection)    cough with green sputum since 11-26-15  . Vertigo    10 yrs ago- only-    Past Surgical History:  Procedure Laterality Date  . CHOLECYSTECTOMY     history of gallstones  . LEEP    . right knee meniscus repair  2014  . SHOULDER OPEN ROTATOR CUFF REPAIR Right 12/01/2015   Procedure: RIGHT SHOULDER MINI OPEN ROTATOR CUFF REPAIR WITH RESECTON OF DISTAL CLAVICLE AND SUBACROMIAL DECOMPRESSION;  Surgeon: Susa Day, MD;   [facility];  Service: Orthopedics;  Laterality: Right;  . TOTAL KNEE ARTHROPLASTY Right 06/13/2016   Procedure: RIGHT TOTAL KNEE ARTHROPLASTY;  Surgeon: Susa Day, MD;  [facility];  Service: Orthopedics;  Laterality: Right;  . TOTAL KNEE ARTHROPLASTY Left 07/16/2017   Procedure: LEFT TOTAL KNEE ARTHROPLASTY;  Surgeon: Susa Day, MD;  [facility];  Service: Orthopedics;  Laterality: Left;  2.5 hrs    There were no vitals filed for this visit.      [facility] PT Assessment - 10/06/17 0001      Assessment   Medical Diagnosis  Lt TKA    Referring Provider  Dr Susa Day    Onset Date/Surgical Date  07/16/17    Hand Dominance  Right    Next MD Visit  10/08/17      Observation/Other Assessments   Focus on Therapeutic Outcomes (FOTO)   49% limitation       Sensation   Additional Comments  WFL's some numbness at incision area       AROM   Right/Left Knee  -- AAROM in supine     Left Knee Extension  -5      Strength  Left Hip Flexion  4+/5    Left Hip ABduction  4+/5    Right/Left Knee  -- 5-/5    Right/Left Ankle  -- 5-/5      Palpation   Patella mobility   decreased hypomobile                  [facility] Adult PT Treatment/Exercise - 10/06/17 0001      Knee/Hip Exercises: Stretches   Passive Hamstring Stretch  Left;3 reps;30 seconds supine with strap     ITB Stretch  Left;3 reps;30 seconds supine with strap     Other Knee/Hip Stretches  adductor stretch supine with strap 30 sec x 3       Knee/Hip Exercises: Aerobic   Stationary Bike  L3 x 6 min       Knee/Hip Exercises: Standing   Lateral Step Up  Left;2 sets;10 reps;Step Height: 4";Hand Hold: 1    Forward Step Up  Left;2 sets;10 reps;Step Height: 6";Hand Hold: 1    Step Down  Right;2 sets;10 reps;Hand Hold: 2;Step Height: 4"      Knee/Hip Exercises: Supine   Quad Sets  Strengthening;Left;20 reps heel on foam roll     Straight Leg Raises  AROM;Strengthening;Left;10 reps       Acupuncturist Stimulation Location  Lt knee     Heritage manager Stimulation Parameters  to tolerance    Electrical Stimulation Goals  Edema;Pain      Vasopneumatic   Number Minutes Vasopneumatic   15 minutes    Vasopnuematic Location   Knee    Vasopneumatic Pressure  Medium    Vasopneumatic Temperature   3*      Manual Therapy   Manual therapy comments  assisted knee extension 20 sec hold x 3 reps                PT Short Term Goals - 08/06/17 1015      PT SHORT TERM GOAL #1   Period  Days        PT Long Term Goals - 10/06/17 8016      PT LONG TERM GOAL #1   Title  Independent with advanced HEP (11/17/17)    Time  20    Period  Weeks    Status  Revised      PT LONG TERM GOAL #2   Title  improve Lt Knee ROM -4 extension to 120 flexion (11/17/17)      Time  20    Status  Revised      PT LONG TERM GOAL #3   Title  demo Lt LE strength =/> 5-/5 to allow alternating gait on stairs (11/17/17)    Time  20    Period  Weeks    Status  Revised      PT LONG TERM GOAL #4   Title  improve gait pattern with minimal limp with improved gait pattern with walking without assistive device  (11/17/17)      Time  20    Period  Weeks    Status  Revised      PT LONG TERM GOAL #5   Title  Maintain FOTO =/< 52% limited, CK level  ( 11/17/17)      Time  20    Period  Weeks    Status  Revised            Plan - 10/06/17 0940  Clinical Impression Statement  Patient presents with LBP since Friday 10/03/17. She has a history of LBP in the past requiring injection. She had some limitations in exercise tolerance with rehab today. Maizie has made steady progress with knee rehab including increasing ROM and strength. She is now ambulating without assistive device with minimal limp. She is working on [employer]. Progressing well toward stated goals of therapy.     Rehab Potential  Excellent    PT Frequency  2x / week    PT  Duration  12 weeks    PT Treatment/Interventions  Gait training;Neuromuscular re-education;Manual techniques;Functional mobility training;Moist Heat;Ultrasound;Therapeutic activities;Patient/family education;Taping;Vasopneumatic Device;Therapeutic exercise;Cryotherapy;Electrical Stimulation;Passive range of motion    PT Next Visit Plan  continue with knee rehab as tolerated; continue [place] as indicated for tightness     Consulted and Agree with Plan of Care  Patient       Patient will benefit from skilled therapeutic intervention in order to improve the following deficits and impairments:  Abnormal gait, Pain, Decreased mobility, Decreased scar mobility, Increased muscle spasms, Decreased strength, Decreased range of motion, Decreased endurance, Decreased balance, Difficulty walking, Increased edema  Visit Diagnosis: Muscle weakness (generalized) - Plan: PT plan of care cert/re-cert  Localized edema - Plan: PT plan of care cert/re-cert  Stiffness of left knee, not elsewhere classified - Plan: PT plan of care cert/re-cert  Acute pain of left knee - Plan: PT plan of care cert/re-cert     Problem List Patient Active Problem List   Diagnosis Date Noted  . Primary osteoarthritis of left knee 07/16/2017  . Right knee DJD 06/13/2016  . Complete rotator cuff tear 12/01/2015  . Wheezing 04/07/2014  . Impaired fasting glucose 07/26/2013  . Essential hypertension, benign 07/26/2013  . Back pain 07/26/2013  . Knee pain, bilateral 01/10/2013  . Morbid obesity ([place]) 01/10/2013  . Headache(784.0) 12/06/2012    Nima Bamburg Nilda Simmer PT, MPH  10/06/2017, 10:13 AM  [facility] [address] [place], [place], [address] Phone: [phone]   Fax:  [phone]  Name: VANITA CANNELL MRN: [id number] Date of Birth: 12-21-1960  PHYSICAL THERAPY DISCHARGE SUMMARY  Visits from Start of Care: 2-  Current functional level related to goals / functional  outcomes: See progress note for discharge status   Remaining deficits: See progress note   Education / Equipment: HEP  Plan: Patient agrees to discharge.  Patient goals were met. Patient is being discharged due to meeting the stated rehab goals.  ?????    Kiyo Heal P. Helene Kelp PT, MPH 10/22/17 2:31 PM

## 2017-12-14 IMAGING — DX DG KNEE 1-2V PORT*R*
2 series · 2 of 2 positions shown · non-contrast
Comparison: None.

CLINICAL DATA: Post right total knee replacement

EXAM:
PORTABLE RIGHT KNEE - 1-2 VIEW

[knee ap]
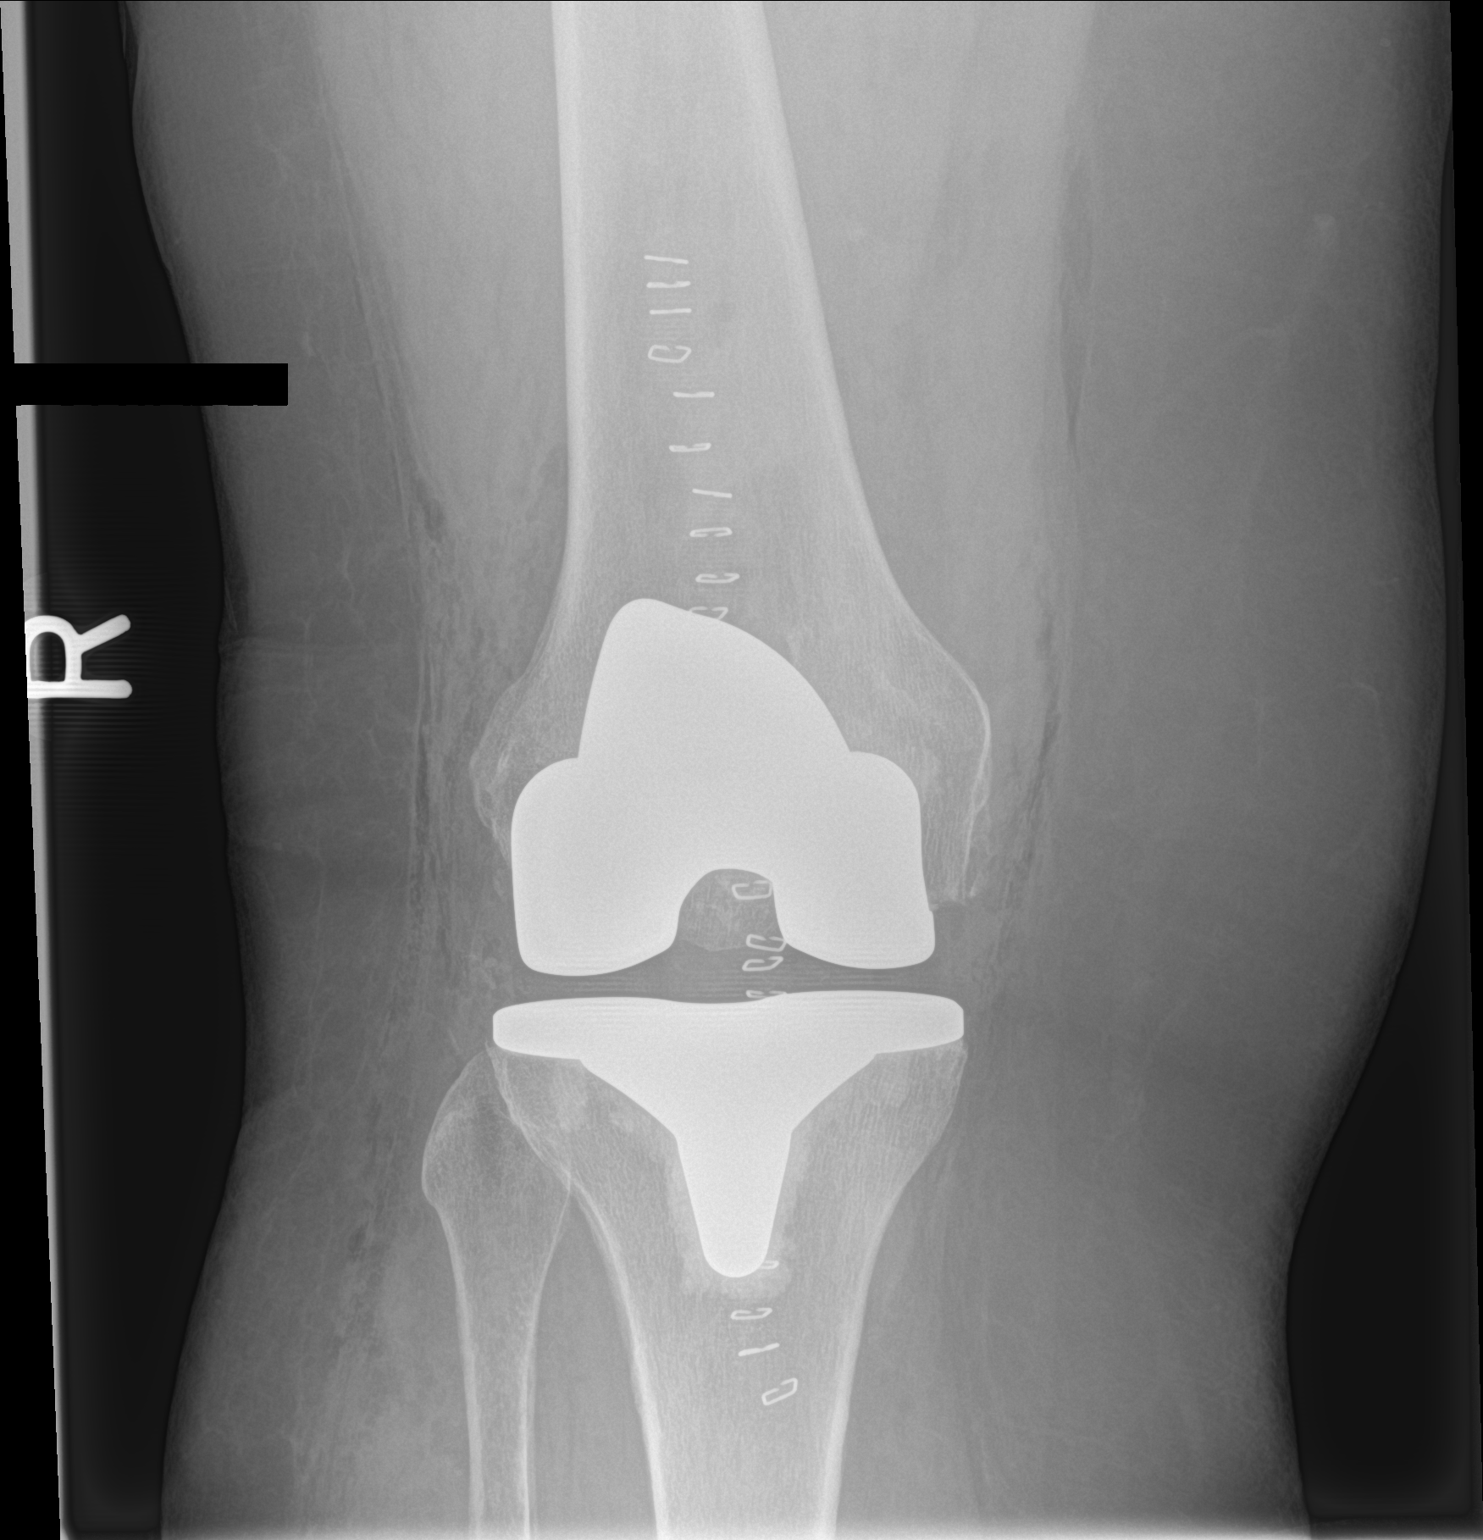

[knee lat]
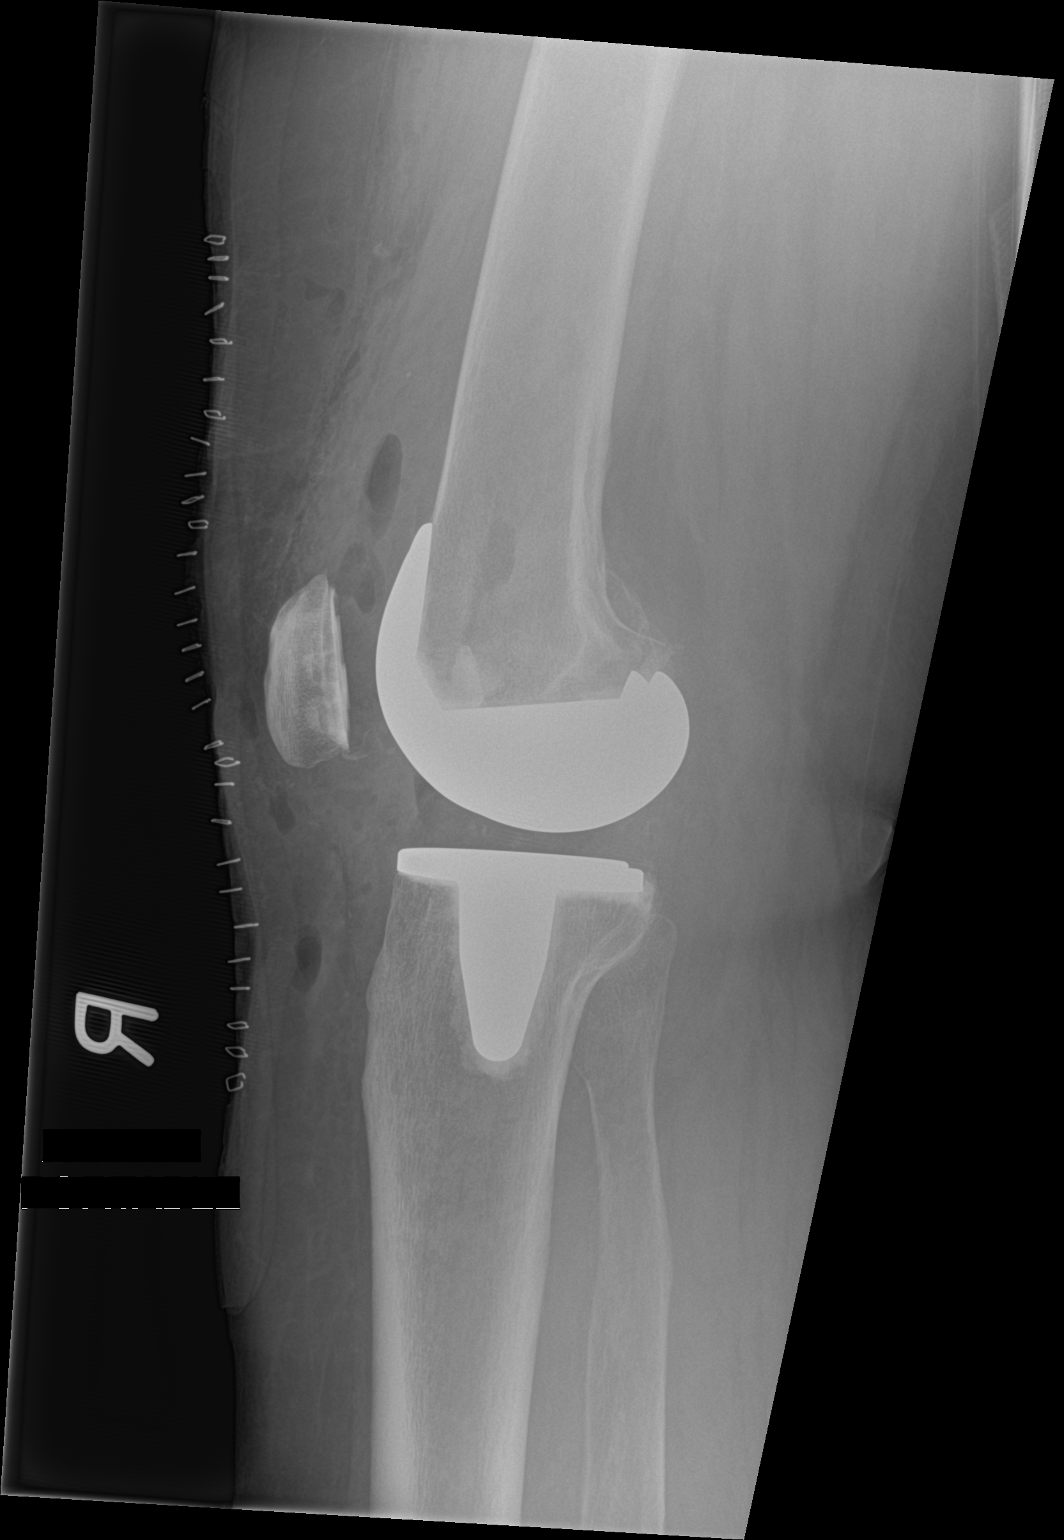

[2 of 2 positions shown; findings below may reference images not displayed]

FINDINGS: The femoral and tibial components of the right total knee
replacement appear to be in good position and alignment. No
complicating features are seen. Air is noted in the soft tissues and
joint space postoperatively. There is lucency overlying the distal
right femur on the lateral view probably representing air in the
soft tissues overlapping.
IMPRESSION: Right total knee replacement components in good position. No
complicating features.

I did call this report as requested to the PACU at [REDACTED] at the time of interpretation.

## 2019-01-16 IMAGING — DX DG KNEE 1-2V PORT*L*
2 series · 2 of 2 positions shown · non-contrast
Comparison: None in PACs

CLINICAL DATA: Postoperative images from left total knee joint
prosthesis placement.

EXAM:
PORTABLE LEFT KNEE - 1-2 VIEW

[knee ap]
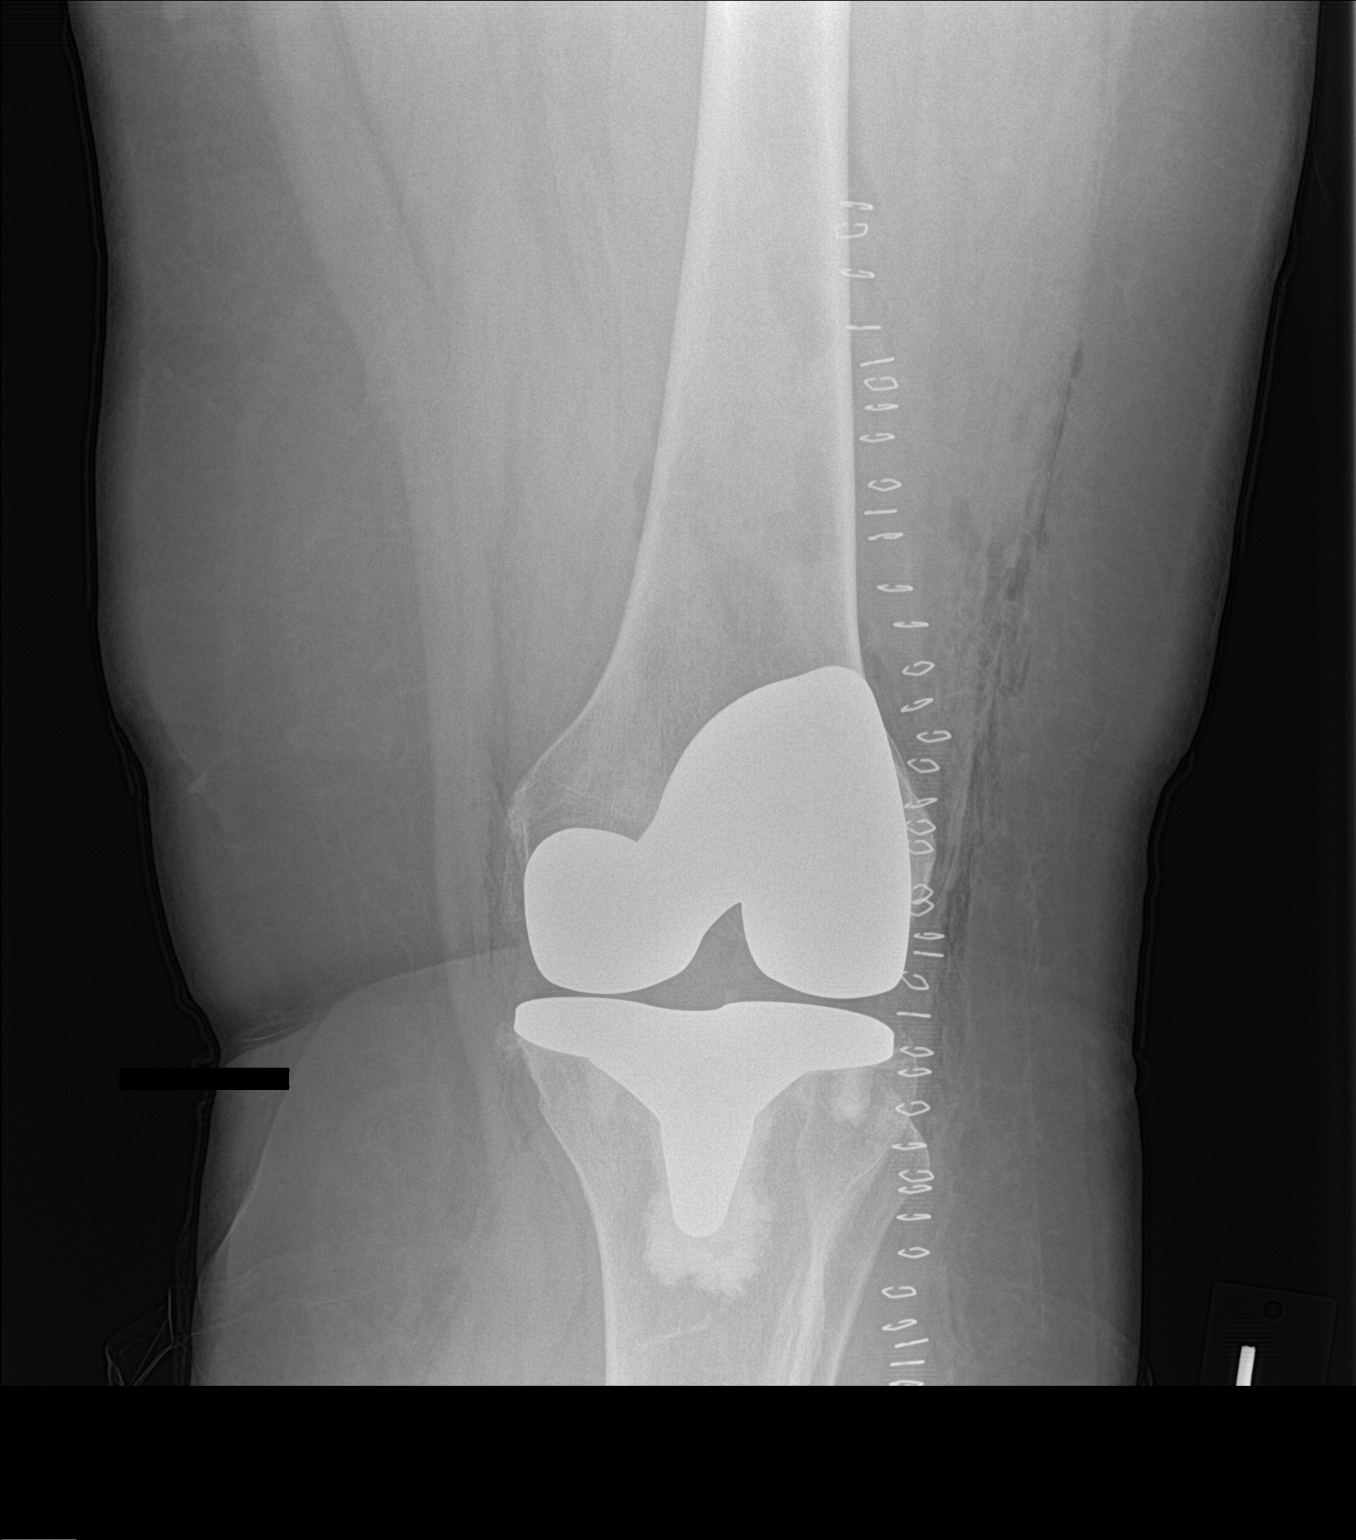

[knee lat]
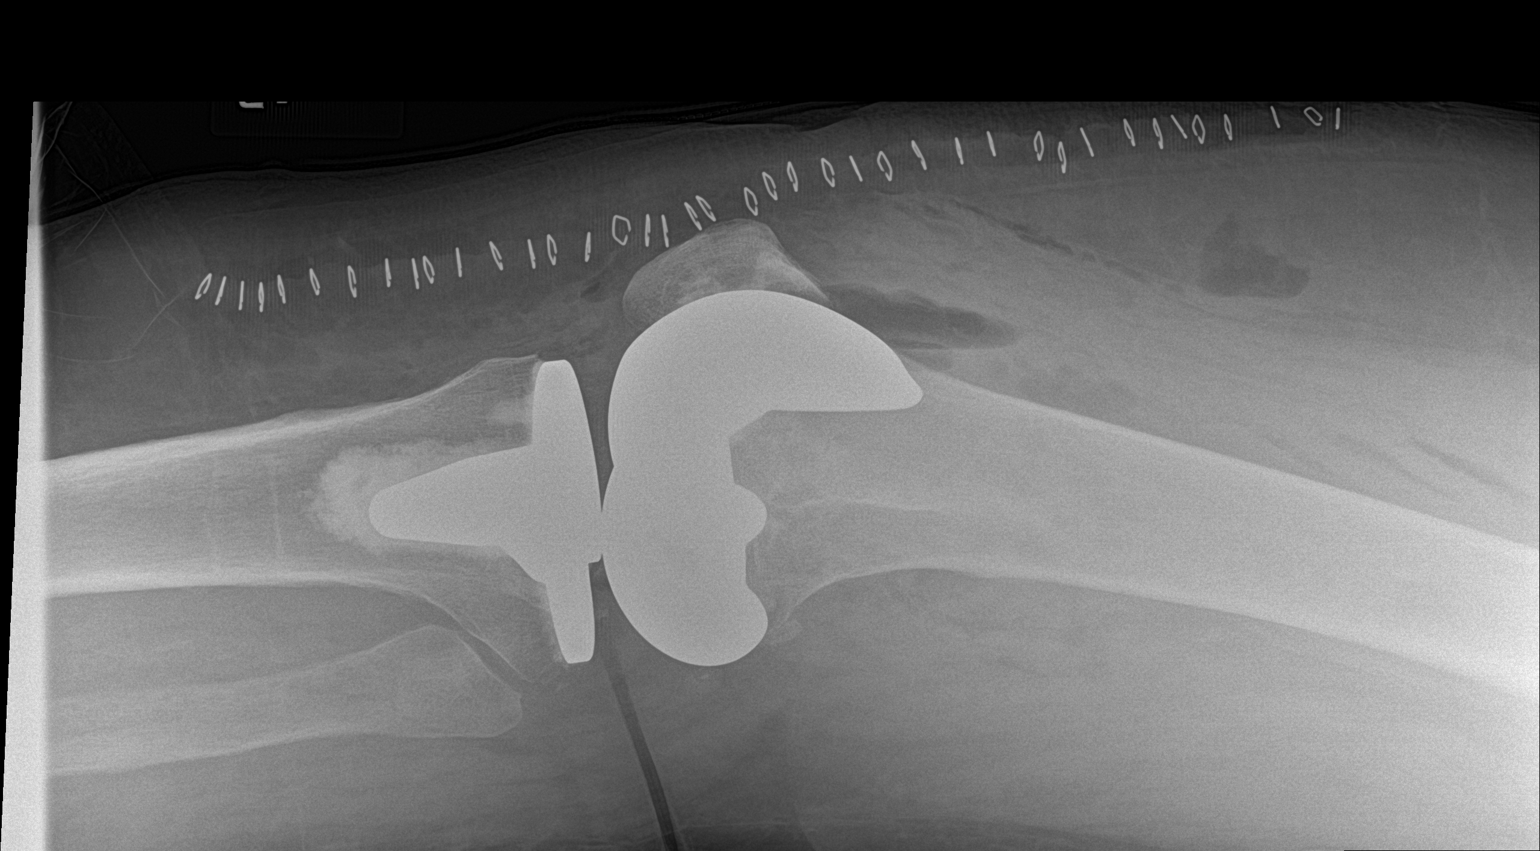

[2 of 2 positions shown; findings below may reference images not displayed]

FINDINGS: The patient has undergone placement of the left total knee joint
prosthesis. Radiographic positioning of the prosthetic components is
good. The interface with the native bone appears normal. There is
gas and fluid within the anterior soft tissues. Surgical skin
staples are present.
IMPRESSION: No immediate postprocedure complication following left total knee
joint prosthesis placement.

## 2022-05-17 DIAGNOSIS — N8502 Endometrial intraepithelial neoplasia [EIN]: Secondary | ICD-10-CM | POA: Insufficient documentation

## 2022-07-04 HISTORY — PX: TOTAL ABDOMINAL HYSTERECTOMY: SHX209

## 2022-08-01 LAB — HM MAMMOGRAPHY

## 2022-08-09 LAB — HM DIABETES EYE EXAM

## 2022-10-21 DIAGNOSIS — I10 Essential (primary) hypertension: Secondary | ICD-10-CM | POA: Insufficient documentation

## 2022-10-21 DIAGNOSIS — E119 Type 2 diabetes mellitus without complications: Secondary | ICD-10-CM | POA: Insufficient documentation

## 2022-10-31 ENCOUNTER — Encounter: Payer: Self-pay | Admitting: Emergency Medicine

## 2022-10-31 ENCOUNTER — Ambulatory Visit
Admission: EM | Admit: 2022-10-31 | Discharge: 2022-10-31 | Disposition: A | Discharge status: Home / Self Care | Attending: Family Medicine | Admitting: Family Medicine

## 2022-10-31 DIAGNOSIS — H66002 Acute suppurative otitis media without spontaneous rupture of ear drum, left ear: Secondary | ICD-10-CM

## 2022-10-31 DIAGNOSIS — B9689 Other specified bacterial agents as the cause of diseases classified elsewhere: Secondary | ICD-10-CM | POA: Diagnosis not present

## 2022-10-31 DIAGNOSIS — J019 Acute sinusitis, unspecified: Secondary | ICD-10-CM | POA: Diagnosis not present

## 2022-10-31 MED ORDER — CEFDINIR 300 MG PO CAPS: 300.0000 mg | ORAL_CAPSULE | Freq: Two times a day (BID) | ORAL | 0 refills | Status: DC

## 2022-10-31 NOTE — Discharge Instructions (Signed)
Make sure you are drinking lots of fluids Use Flonase daily.  This will help with congestion, and drainage in the ear Take cefdinir 2 times a day for 10 full days See your doctor if symptoms or not improving by next week Take Tylenol or ibuprofen for pain

## 2022-10-31 NOTE — ED Triage Notes (Signed)
Patient c/o possible sinus infection, head and nasal congestion x 1 week.  Left ear pain started yesterday.  Patient has taken Dayquil and Mucinex.

## 2022-10-31 NOTE — ED Provider Notes (Signed)
[name]    CSN: [id number] Arrival date & time: 10/31/22  0808      History   Chief Complaint Chief Complaint  Patient presents with   Sinus Infection    HPI Megan Petersen is a 62 y.o. female.   HPI  Patient states she has been sick for a week.  She feels like she has a sinus infection.  There is pressure and pain in her face.  Sore throat.  Hoarse voice.  Starting yesterday she had some ear pressure and pain.  Today her left ear is very painful.  She states that she has had sinus infections before.  No coughing or chest congestion.  Past Medical History:  Diagnosis Date   Allergy    Asthma    Atypical glandular cells on Pap smear    Esophageal reflux    Family history of adverse reaction to anesthesia    brother slow to awaken   GERD (gastroesophageal reflux disease)    Nexium controls   Headache    Hemorrhoids    history of- not a problem at present.   Hyperlipidemia    Hypertension    Impaired fasting glucose    Dr. Darcella Cheshire -told pt Hgb A1C level was good.   LSIL (low grade squamous intraepithelial lesion) on Pap smear    Morbid obesity (HCC)    Osteoarthritis    Bilateral knees   Osteoarthritis    Pneumonia 2016   PONV (postoperative nausea and vomiting)    Pre-diabetes    hgA1c on 06-09-17 was 5.7   Primary cough headache    Rosacea    Swelling of limb    Transfusion history    '88- s/p childbirth   Trigeminal neuralgia    URI (upper respiratory infection)    cough with green sputum since 11-26-15   Vertigo    10 yrs ago- only-    Patient Active Problem List   Diagnosis Date Noted   Primary osteoarthritis of left knee 07/16/2017   Right knee DJD 06/13/2016   Complete rotator cuff tear 12/01/2015   Wheezing 04/07/2014   Impaired fasting glucose 07/26/2013   Essential hypertension, benign 07/26/2013   Back pain 07/26/2013   Knee pain, bilateral 01/10/2013   Morbid obesity ([place]) 01/10/2013   Headache(784.0) 12/06/2012     Past Surgical History:  Procedure Laterality Date   CHOLECYSTECTOMY     history of gallstones   LEEP     right knee meniscus repair  2014   SHOULDER OPEN ROTATOR CUFF REPAIR Right 12/01/2015   Procedure: RIGHT SHOULDER MINI OPEN ROTATOR CUFF REPAIR WITH RESECTON OF DISTAL CLAVICLE AND SUBACROMIAL DECOMPRESSION;  Surgeon: Susa Day, MD;  Location: [facility];  Service: Orthopedics;  Laterality: Right;   TOTAL KNEE ARTHROPLASTY Right 06/13/2016   Procedure: RIGHT TOTAL KNEE ARTHROPLASTY;  Surgeon: Susa Day, MD;  Location: [facility];  Service: Orthopedics;  Laterality: Right;   TOTAL KNEE ARTHROPLASTY Left 07/16/2017   Procedure: LEFT TOTAL KNEE ARTHROPLASTY;  Surgeon: Susa Day, MD;  Location: [facility];  Service: Orthopedics;  Laterality: Left;  2.5 hrs    OB History   No obstetric history on file.      Home Medications    Prior to Admission medications   Medication Sig Start Date End Date Taking? Authorizing Provider  cefdinir (OMNICEF) 300 MG capsule Take 1 capsule (300 mg total) by mouth 2 (two) times daily. 10/31/22  Yes Raylene Everts, MD  cloNIDine (CATAPRES) 0.1 MG tablet  Take 0.1 mg 2 (two) times daily by mouth. Morning & afternoon.   Yes [provider]  diclofenac sodium (VOLTAREN) 1 % GEL Apply 2-4 g 4 (four) times daily as needed topically (for pain.).   Yes [provider]  docusate sodium (STOOL SOFTENER) 250 MG capsule Take 250 mg daily by mouth.   Yes [provider]  Ipratropium-Albuterol (COMBIVENT RESPIMAT) 20-100 MCG/ACT AERS respimat Inhale 1 puff into the lungs every 6 (six) hours as needed for wheezing.   Yes [provider]  irbesartan (AVAPRO) 300 MG tablet Take 300 mg daily by mouth.   Yes [provider]  lidocaine (LIDODERM) 5 % Place 1-3 patches daily as needed onto the skin (pain). Remove & Discard patch within 12 hours or as directed by MD    Yes [provider]  methocarbamol (ROBAXIN) 500 MG  tablet Take 1 tablet (500 mg total) by mouth every 6 (six) hours as needed for muscle spasms. 07/16/17  Yes Susa Day, MD  traMADol (ULTRAM) 50 MG tablet Take 1 tablet (50 mg total) by mouth every 6 (six) hours as needed for moderate pain. Patient taking differently: Take 50 mg by mouth 3 (three) times daily as needed for moderate pain (for pain). 12/01/15  Yes Susa Day, MD  budesonide-formoterol [facility]) 160-4.5 MCG/ACT inhaler Inhale 2 puffs into the lungs 2 (two) times daily. Patient taking differently: Inhale 2 puffs daily into the lungs.  04/20/14 07/08/18  Jonathon Resides, MD  esomeprazole (NEXIUM) 40 MG capsule Take 1 capsule (40 mg total) by mouth daily before breakfast. 09/20/13 07/08/17  Zanard, Bernadene Bell, MD  furosemide (LASIX) 40 MG tablet Take 0.5 tablets (20 mg total) by mouth daily. 06/14/16 02/26/19  Cecilie Kicks, PA-C  montelukast (SINGULAIR) 10 MG tablet Take 1 tablet (10 mg total) by mouth at bedtime. 04/20/14 07/08/18  Jonathon Resides, MD    Family History Family History  Problem Relation Age of Onset   Heart disease Mother    Hypertension Mother    Cancer Mother        Ovarian   Drug abuse Sister    Hypertension Sister    Diabetes Sister    Drug abuse Brother    Hypertension Brother    Diabetes Brother    Cancer Father        Lymphoma    Social History Social History   Tobacco Use   Smoking status: Never   Smokeless tobacco: Never  Substance Use Topics   Alcohol use: No   Drug use: No     Allergies   Ace inhibitors, Betadine [povidone iodine], Chloraprep one step [chlorhexidine gluconate], Demerol [meperidine], Erythromycin, Lyrica [pregabalin], Penicillins, Shellfish allergy, and Codeine Allergies reviewed  Review of Systems Review of Systems See HPI  Physical Exam Triage Vital Signs ED Triage Vitals  Enc Vitals Group     BP 10/31/22 0836 (!) 157/93     Pulse Rate 10/31/22 0836 83     Resp 10/31/22 0836 18     Temp 10/31/22 0836  98.4 F (36.9 C)     Temp Source 10/31/22 0836 Oral     SpO2 10/31/22 0836 95 %     Weight 10/31/22 0837 270 lb (122.5 kg)     Height 10/31/22 0837 '5\' 2"'$  (1.575 m)     Head Circumference --      Peak Flow --      Pain Score 10/31/22 0837 6     Pain Loc --  Pain Edu? --      Excl. in [place]? --    No data found.  Updated Vital Signs BP (!) 157/93 (BP Location: Right Arm)   Pulse 83   Temp 98.4 F (36.9 C) (Oral)   Resp 18   Ht '5\' 2"'$  (1.575 m)   Wt 122.5 kg   SpO2 95%   BMI 49.38 kg/m      Physical Exam Constitutional:      General: She is not in acute distress.    Appearance: She is well-developed. She is obese. She is ill-appearing.  HENT:     Head: Normocephalic and atraumatic.     Right Ear: Tympanic membrane, ear canal and external ear normal.     Nose: Congestion and rhinorrhea present.     Mouth/Throat:     Mouth: Mucous membranes are moist.     Pharynx: Posterior oropharyngeal erythema present.     Comments: Left TM is injected and dull.  Bulging.  Nasal congestion with swollen and red membranes yellow postnasal drip.  Tonsils large and red.  No exudate.  Facial sinuses are tender Eyes:     Conjunctiva/sclera: Conjunctivae normal.     Pupils: Pupils are equal, round, and reactive to light.  Cardiovascular:     Rate and Rhythm: Normal rate.     Heart sounds: Normal heart sounds.  Pulmonary:     Effort: Pulmonary effort is normal. No respiratory distress.     Breath sounds: Normal breath sounds.  Abdominal:     General: There is no distension.     Palpations: Abdomen is soft.  Musculoskeletal:        General: Normal range of motion.     Cervical back: Normal range of motion.  Lymphadenopathy:     Cervical: No cervical adenopathy.  Skin:    General: Skin is warm and dry.  Neurological:     Mental Status: She is alert.      UC Treatments / Results  Labs (all labs ordered are listed, but only abnormal results are displayed) Labs Reviewed - No data  to display  EKG   Radiology No results found.  Procedures Procedures (including critical care time)  Medications Ordered in UC Medications - No data to display  Initial Impression / Assessment and Plan / UC Course  I have reviewed the triage vital signs and the nursing notes.  Pertinent labs & imaging results that were available during my care of the patient were reviewed by me and considered in my medical decision making (see chart for details).     Final Clinical Impressions(s) / UC Diagnoses   Final diagnoses:  Non-recurrent acute suppurative otitis media of left ear without spontaneous rupture of tympanic membrane  Acute bacterial sinusitis     Discharge Instructions      Make sure you are drinking lots of fluids Use Flonase daily.  This will help with congestion, and drainage in the ear Take cefdinir 2 times a day for 10 full days See your doctor if symptoms or not improving by next week Take Tylenol or ibuprofen for pain    ED Prescriptions     Medication Sig Dispense Auth. Provider   cefdinir (OMNICEF) 300 MG capsule Take 1 capsule (300 mg total) by mouth 2 (two) times daily. 20 capsule Raylene Everts, MD      PDMP not reviewed this encounter.   Raylene Everts, MD 10/31/22 [phone]

## 2022-11-01 ENCOUNTER — Telehealth: Payer: Self-pay | Admitting: Emergency Medicine

## 2022-11-01 NOTE — Telephone Encounter (Signed)
LMTRC.  Advised if doing well to disregard the call.  Any questions or concerns, don't hesitate to contact the office. 

## 2022-11-21 ENCOUNTER — Ambulatory Visit
Admission: RE | Admit: 2022-11-21 | Discharge: 2022-11-21 | Disposition: A | Discharge status: Home / Self Care | Payer: Medicare Other | Source: Ambulatory Visit | Attending: Family Medicine | Admitting: Family Medicine

## 2022-11-21 ENCOUNTER — Other Ambulatory Visit: Payer: Self-pay

## 2022-11-21 VITALS — BP 179/96 | HR 100 | Temp 98.5°F | Resp 22

## 2022-11-21 DIAGNOSIS — H6692 Otitis media, unspecified, left ear: Secondary | ICD-10-CM

## 2022-11-21 MED ORDER — AZITHROMYCIN 250 MG PO TABS: 250.0000 mg | ORAL_TABLET | Freq: Every day | ORAL | 0 refills | Status: DC

## 2022-11-21 MED ORDER — PREDNISONE 50 MG PO TABS: ORAL_TABLET | ORAL | 0 refills | Status: DC

## 2022-11-21 NOTE — Discharge Instructions (Addendum)
Advised patient to take medications as directed with food to completion.  Encouraged patient to increase daily water intake to 64 ounces per day while taking this medication.  Advised patient if symptoms worsen and/or unresolved please follow-up with PCP or ENT for further evaluation.

## 2022-11-21 NOTE — ED Provider Notes (Signed)
[name]    CSN: [id number] Arrival date & time: 11/21/22  1850      History   Chief Complaint Chief Complaint  Patient presents with   Cough    Sinus congestion - Entered by patient    HPI Megan Petersen is a 62 y.o. female.   HPI Patient was evaluated and treated here on 10/31/2022 for nonrecurrent acute suppurative otitis media of left ear without spontaneous rupture of tympanic membrane and acute bacterial sinusitis.  PMH significant for morbid obesity, HTN, and asthma.  Past Medical History:  Diagnosis Date   Allergy    Asthma    Atypical glandular cells on Pap smear    Esophageal reflux    Family history of adverse reaction to anesthesia    brother slow to awaken   GERD (gastroesophageal reflux disease)    Nexium controls   Headache    Hemorrhoids    history of- not a problem at present.   Hyperlipidemia    Hypertension    Impaired fasting glucose    Dr. Darcella Cheshire -told pt Hgb A1C level was good.   LSIL (low grade squamous intraepithelial lesion) on Pap smear    Morbid obesity (HCC)    Osteoarthritis    Bilateral knees   Osteoarthritis    Pneumonia 2016   PONV (postoperative nausea and vomiting)    Pre-diabetes    hgA1c on 06-09-17 was 5.7   Primary cough headache    Rosacea    Swelling of limb    Transfusion history    '88- s/p childbirth   Trigeminal neuralgia    URI (upper respiratory infection)    cough with green sputum since 11-26-15   Vertigo    10 yrs ago- only-    Patient Active Problem List   Diagnosis Date Noted   Primary osteoarthritis of left knee 07/16/2017   Right knee DJD 06/13/2016   Complete rotator cuff tear 12/01/2015   Wheezing 04/07/2014   Impaired fasting glucose 07/26/2013   Essential hypertension, benign 07/26/2013   Back pain 07/26/2013   Knee pain, bilateral 01/10/2013   Morbid obesity ([place]) 01/10/2013   Headache(784.0) 12/06/2012    Past Surgical History:  Procedure Laterality Date    CHOLECYSTECTOMY     history of gallstones   LEEP     right knee meniscus repair  2014   SHOULDER OPEN ROTATOR CUFF REPAIR Right 12/01/2015   Procedure: RIGHT SHOULDER MINI OPEN ROTATOR CUFF REPAIR WITH RESECTON OF DISTAL CLAVICLE AND SUBACROMIAL DECOMPRESSION;  Surgeon: Susa Day, MD;  Location: [facility];  Service: Orthopedics;  Laterality: Right;   TOTAL KNEE ARTHROPLASTY Right 06/13/2016   Procedure: RIGHT TOTAL KNEE ARTHROPLASTY;  Surgeon: Susa Day, MD;  Location: [facility];  Service: Orthopedics;  Laterality: Right;   TOTAL KNEE ARTHROPLASTY Left 07/16/2017   Procedure: LEFT TOTAL KNEE ARTHROPLASTY;  Surgeon: Susa Day, MD;  Location: [facility];  Service: Orthopedics;  Laterality: Left;  2.5 hrs    OB History   No obstetric history on file.      Home Medications    Prior to Admission medications   Medication Sig Start Date End Date Taking? Authorizing Provider  azithromycin (ZITHROMAX) 250 MG tablet Take 1 tablet (250 mg total) by mouth daily. Take first 2 tablets together, then 1 every day until finished. 11/21/22  Yes Eliezer Lofts, FNP  predniSONE (DELTASONE) 50 MG tablet Take 1 tab p.o. daily for 5 days. 11/21/22  Yes Eliezer Lofts, FNP  budesonide-formoterol [facility])  160-4.5 MCG/ACT inhaler Inhale 2 puffs into the lungs 2 (two) times daily. Patient taking differently: Inhale 2 puffs daily into the lungs.  04/20/14 07/08/18  Jonathon Resides, MD  cefdinir (OMNICEF) 300 MG capsule Take 1 capsule (300 mg total) by mouth 2 (two) times daily. Patient not taking: Reported on 11/21/2022 10/31/22   Raylene Everts, MD  cloNIDine (CATAPRES) 0.1 MG tablet Take 0.1 mg 2 (two) times daily by mouth. Morning & afternoon.    [provider]  diclofenac sodium (VOLTAREN) 1 % GEL Apply 2-4 g 4 (four) times daily as needed topically (for pain.).    [provider]  docusate sodium (STOOL SOFTENER) 250 MG capsule Take 250 mg daily by mouth.    [provider]   esomeprazole (NEXIUM) 40 MG capsule Take 1 capsule (40 mg total) by mouth daily before breakfast. 09/20/13 07/08/17  Zanard, Bernadene Bell, MD  furosemide (LASIX) 40 MG tablet Take 0.5 tablets (20 mg total) by mouth daily. 06/14/16 02/26/19  Cecilie Kicks, PA-C  Ipratropium-Albuterol (COMBIVENT RESPIMAT) 20-100 MCG/ACT AERS respimat Inhale 1 puff into the lungs every 6 (six) hours as needed for wheezing.    [provider]  irbesartan (AVAPRO) 300 MG tablet Take 300 mg daily by mouth.    [provider]  lidocaine (LIDODERM) 5 % Place 1-3 patches daily as needed onto the skin (pain). Remove & Discard patch within 12 hours or as directed by MD     [provider]  methocarbamol (ROBAXIN) 500 MG tablet Take 1 tablet (500 mg total) by mouth every 6 (six) hours as needed for muscle spasms. 07/16/17   Susa Day, MD  montelukast (SINGULAIR) 10 MG tablet Take 1 tablet (10 mg total) by mouth at bedtime. 04/20/14 07/08/18  Jonathon Resides, MD  traMADol (ULTRAM) 50 MG tablet Take 1 tablet (50 mg total) by mouth every 6 (six) hours as needed for moderate pain. Patient taking differently: Take 50 mg by mouth 3 (three) times daily as needed for moderate pain (for pain). 12/01/15   Susa Day, MD    Family History Family History  Problem Relation Age of Onset   Heart disease Mother    Hypertension Mother    Cancer Mother        Ovarian   Drug abuse Sister    Hypertension Sister    Diabetes Sister    Drug abuse Brother    Hypertension Brother    Diabetes Brother    Cancer Father        Lymphoma    Social History Social History   Tobacco Use   Smoking status: Never   Smokeless tobacco: Never  Vaping Use   Vaping Use: Never used  Substance Use Topics   Alcohol use: No   Drug use: No     Allergies   Ace inhibitors, Betadine [povidone iodine], Chloraprep one step [chlorhexidine gluconate], Demerol [meperidine], Erythromycin, Lyrica [pregabalin], Penicillins,  Shellfish allergy, and Codeine   Review of Systems Review of Systems  HENT:  Positive for congestion.   Respiratory:  Positive for cough.   All other systems reviewed and are negative.    Physical Exam Triage Vital Signs ED Triage Vitals  Enc Vitals Group     BP      Pulse      Resp      Temp      Temp src      SpO2      Weight  Height      Head Circumference      Peak Flow      Pain Score      Pain Loc      Pain Edu?      Excl. in [place]?    No data found.  Updated Vital Signs BP (!) 179/96 (BP Location: Left Arm)   Pulse 100   Temp 98.5 F (36.9 C) (Oral)   Resp (!) 22   SpO2 95%       Physical Exam Vitals reviewed.  Constitutional:      Appearance: Normal appearance. She is obese.  HENT:     Head: Normocephalic and atraumatic.     Right Ear: Tympanic membrane and external ear normal.     Left Ear: External ear normal.     Ears:     Comments: Left TM: Red rimmed, erythematous, bulging; moderate eustachian dysfunction tube noted bilaterally    Nose: Nose normal.     Mouth/Throat:     Mouth: Mucous membranes are moist.     Pharynx: Oropharynx is clear.  Eyes:     Extraocular Movements: Extraocular movements intact.     Conjunctiva/sclera: Conjunctivae normal.     Pupils: Pupils are equal, round, and reactive to light.  Cardiovascular:     Rate and Rhythm: Normal rate and regular rhythm.     Pulses: Normal pulses.     Heart sounds: Normal heart sounds.  Pulmonary:     Effort: Pulmonary effort is normal.     Breath sounds: Normal breath sounds. No wheezing, rhonchi or rales.  Musculoskeletal:        General: Normal range of motion.     Cervical back: Normal range of motion and neck supple.  Skin:    General: Skin is warm and dry.  Neurological:     General: No focal deficit present.     Mental Status: She is alert and oriented to person, place, and time. Mental status is at baseline.      UC Treatments / Results  Labs (all labs ordered  are listed, but only abnormal results are displayed) Labs Reviewed - No data to display  EKG   Radiology No results found.  Procedures Procedures (including critical care time)  Medications Ordered in UC Medications - No data to display  Initial Impression / Assessment and Plan / UC Course  I have reviewed the triage vital signs and the nursing notes.  Pertinent labs & imaging results that were available during my care of the patient were reviewed by me and considered in my medical decision making (see chart for details).     MDM: 1.  Left acute otitis media-Rx'd Zithromax 500 mg day 1, then 250 mg x 4 days, Rx'd Prednisone 50 mg daily x 5 days. Advised patient to take medications as directed with food to completion.  Encouraged patient to increase daily water intake to 64 ounces per day while taking this medication.  Advised patient if symptoms worsen and/or unresolved please follow-up with PCP or ENT for further evaluation.  Discharged home, hemodynamically stable. Final Clinical Impressions(s) / UC Diagnoses   Final diagnoses:  Left acute otitis media     Discharge Instructions      Advised patient to take medications as directed with food to completion.  Encouraged patient to increase daily water intake to 64 ounces per day while taking this medication.  Advised patient if symptoms worsen and/or unresolved please follow-up with PCP or ENT for further  evaluation.     ED Prescriptions     Medication Sig Dispense Auth. Provider   azithromycin (ZITHROMAX) 250 MG tablet Take 1 tablet (250 mg total) by mouth daily. Take first 2 tablets together, then 1 every day until finished. 6 tablet Eliezer Lofts, FNP   predniSONE (DELTASONE) 50 MG tablet Take 1 tab p.o. daily for 5 days. 5 tablet Eliezer Lofts, FNP      PDMP not reviewed this encounter.   Eliezer Lofts, [place] 11/21/22 [phone]

## 2022-11-21 NOTE — ED Triage Notes (Signed)
Symptoms started one week ago.  Recently completed a round of antibiotics for an ear infection.  Left ear continues to feel stuffy.  Facial pain, runny nose, coughing, non-productive.  Unknown fever.    Patient has had dayquil today.

## 2022-12-18 ENCOUNTER — Ambulatory Visit (INDEPENDENT_AMBULATORY_CARE_PROVIDER_SITE_OTHER): Payer: Medicare Other | Admitting: Family Medicine

## 2022-12-18 ENCOUNTER — Encounter: Payer: Self-pay | Admitting: Family Medicine

## 2022-12-18 VITALS — BP 130/74 | HR 80 | Temp 97.7°F | Ht 62.0 in | Wt 269.6 lb

## 2022-12-18 DIAGNOSIS — Z13228 Encounter for screening for other metabolic disorders: Secondary | ICD-10-CM | POA: Insufficient documentation

## 2022-12-18 DIAGNOSIS — Z1231 Encounter for screening mammogram for malignant neoplasm of breast: Secondary | ICD-10-CM | POA: Insufficient documentation

## 2022-12-18 DIAGNOSIS — Z794 Long term (current) use of insulin: Secondary | ICD-10-CM

## 2022-12-18 DIAGNOSIS — I10 Essential (primary) hypertension: Secondary | ICD-10-CM | POA: Diagnosis not present

## 2022-12-18 DIAGNOSIS — K219 Gastro-esophageal reflux disease without esophagitis: Secondary | ICD-10-CM | POA: Diagnosis not present

## 2022-12-18 DIAGNOSIS — E119 Type 2 diabetes mellitus without complications: Secondary | ICD-10-CM | POA: Diagnosis not present

## 2022-12-18 DIAGNOSIS — G8929 Other chronic pain: Secondary | ICD-10-CM

## 2022-12-18 DIAGNOSIS — Z7689 Persons encountering health services in other specified circumstances: Secondary | ICD-10-CM

## 2022-12-18 DIAGNOSIS — J45909 Unspecified asthma, uncomplicated: Secondary | ICD-10-CM

## 2022-12-18 DIAGNOSIS — M545 Low back pain, unspecified: Secondary | ICD-10-CM

## 2022-12-18 DIAGNOSIS — Z1211 Encounter for screening for malignant neoplasm of colon: Secondary | ICD-10-CM | POA: Insufficient documentation

## 2022-12-18 MED ORDER — SEMAGLUTIDE (1 MG/DOSE) 4 MG/3ML ~~LOC~~ SOPN: 1.0000 mg | PEN_INJECTOR | SUBCUTANEOUS | 1 refills | Status: DC

## 2022-12-18 MED ORDER — FREESTYLE LIBRE 3 SENSOR MISC: 1.0000 | Freq: Two times a day (BID) | 2 refills | Status: AC

## 2022-12-18 MED ORDER — METHOCARBAMOL 500 MG PO TABS: 500.0000 mg | ORAL_TABLET | Freq: Four times a day (QID) | ORAL | 1 refills | Status: DC | PRN

## 2022-12-18 NOTE — Progress Notes (Signed)
New Patient Office Visit  Subjective    Patient ID: Megan Petersen, female    DOB: 09/17/1960  Age: 62 y.o. MRN: [id number]  CC:  Chief Complaint  Patient presents with   Establish Care    Patient here to establish new care     HPI Megan Petersen presents to establish care with this practice. She is new to me. Lives in [place]. 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    Health maintenance reviewed. Referral sent to GI for colonoscopy. Last pap 03/22/22 per GYN. Had hysterectomy 07/04/22. Will continue to follow with GYN for future pap smears.  History reviewed.  Diabetes: ozempic 1.0 mg weekly, last dose last night.   Hypertension: clonidine BID, Avapro QD, lasix QD  Asthma: uses Symbicort, Combivent, and montelukast, has not needed rescue inhaler. Does not use albuterol. Symptoms are well controlled.   Headaches/shoulder/bilateral knee pain: takes indomethacin 75 ER QD Methylcarbinol for disc issues.   GERD: omeprazole QD. Symptoms are well controlled.   Chart review: 10/24/22 A1C 7.4 07/19/22: lipid panel: triglycerides 205, HDL 33 CMP: GFR > 90.    Outpatient Encounter Medications as of 12/18/2022  Medication Sig   cloNIDine (CATAPRES) 0.1 MG tablet Take 0.1 mg 2 (two) times daily by mouth. Morning & afternoon.   Continuous Glucose Sensor (FREESTYLE LIBRE 3 SENSOR) MISC 1 each by Does not apply route 2 (two) times daily. Place 1 sensor on the skin every 14 days. Use to check glucose continuously   diclofenac sodium (VOLTAREN) 1 % GEL Apply 2-4 g 4 (four) times daily as needed topically (for pain.).   docusate sodium (STOOL SOFTENER) 250 MG capsule Take 250 mg daily by mouth.   Ipratropium-Albuterol (COMBIVENT RESPIMAT) 20-100 MCG/ACT AERS respimat Inhale 1 puff into the lungs every 6 (six) hours as needed for wheezing.   irbesartan (AVAPRO) 300 MG tablet Take 300 mg daily by mouth.   lidocaine (LIDODERM) 5 % Place 1-3 patches daily as needed onto the  skin (pain). Remove & Discard patch within 12 hours or as directed by MD    Semaglutide, 1 MG/DOSE, 4 MG/3ML SOPN Inject 1 mg as directed once a week.   traMADol (ULTRAM) 50 MG tablet Take 1 tablet (50 mg total) by mouth every 6 (six) hours as needed for moderate pain. (Patient taking differently: Take 50 mg by mouth 3 (three) times daily as needed for moderate pain (for pain).)   [DISCONTINUED] methocarbamol (ROBAXIN) 500 MG tablet Take 1 tablet (500 mg total) by mouth every 6 (six) hours as needed for muscle spasms.   budesonide-formoterol (SYMBICORT) 160-4.5 MCG/ACT inhaler Inhale 2 puffs into the lungs 2 (two) times daily. (Patient taking differently: Inhale 2 puffs daily into the lungs. )   esomeprazole (NEXIUM) 40 MG capsule Take 1 capsule (40 mg total) by mouth daily before breakfast.   furosemide (LASIX) 40 MG tablet Take 0.5 tablets (20 mg total) by mouth daily.   methocarbamol (ROBAXIN) 500 MG tablet Take 1 tablet (500 mg total) by mouth every 6 (six) hours as needed for muscle spasms.   montelukast (SINGULAIR) 10 MG tablet Take 1 tablet (10 mg total) by mouth at bedtime.   [DISCONTINUED] azithromycin (ZITHROMAX) 250 MG tablet Take 1 tablet (250 mg total) by mouth daily. Take first 2 tablets together, then 1 every day until finished.   [DISCONTINUED] cefdinir (OMNICEF) 300 MG capsule Take 1 capsule (300 mg total) by mouth 2 (two) times daily. (Patient not taking: Reported  on 11/21/2022)   [DISCONTINUED] predniSONE (DELTASONE) 50 MG tablet Take 1 tab p.o. daily for 5 days.   No facility-administered encounter medications on file as of 12/18/2022.    Past Medical History:  Diagnosis Date   Allergy    Asthma    Atypical glandular cells on Pap smear    Esophageal reflux    Family history of adverse reaction to anesthesia    brother slow to awaken   GERD (gastroesophageal reflux disease)    Nexium controls   Headache    Hemorrhoids    history of- not a problem at present.    Hyperlipidemia    Hypertension    Impaired fasting glucose    Dr. Jenny Reichmann -told pt Hgb A1C level was good.   LSIL (low grade squamous intraepithelial lesion) on Pap smear    Morbid obesity    Osteoarthritis    Bilateral knees   Osteoarthritis    Pneumonia 2016   PONV (postoperative nausea and vomiting)    Pre-diabetes    hgA1c on 06-09-17 was 5.7   Primary cough headache    Rosacea    Swelling of limb    Transfusion history    '88- s/p childbirth   Trigeminal neuralgia    Type 2 diabetes mellitus    URI (upper respiratory infection)    cough with green sputum since 11-26-15   Vertigo    10 yrs ago- only-    Past Surgical History:  Procedure Laterality Date   CHOLECYSTECTOMY     history of gallstones   LEEP     right knee meniscus repair  2014   SHOULDER OPEN ROTATOR CUFF REPAIR Right 12/01/2015   Procedure: RIGHT SHOULDER MINI OPEN ROTATOR CUFF REPAIR WITH RESECTON OF DISTAL CLAVICLE AND SUBACROMIAL DECOMPRESSION;  Surgeon: Jene Every, MD;  Location: [facility];  Service: Orthopedics;  Laterality: Right;   TOTAL KNEE ARTHROPLASTY Right 06/13/2016   Procedure: RIGHT TOTAL KNEE ARTHROPLASTY;  Surgeon: Jene Every, MD;  Location: [facility];  Service: Orthopedics;  Laterality: Right;   TOTAL KNEE ARTHROPLASTY Left 07/16/2017   Procedure: LEFT TOTAL KNEE ARTHROPLASTY;  Surgeon: Jene Every, MD;  Location: [facility];  Service: Orthopedics;  Laterality: Left;  2.5 hrs    Family History  Problem Relation Age of Onset   Heart disease Mother    Hypertension Mother    Cancer Mother        Ovarian   Drug abuse Sister    Hypertension Sister    Diabetes Sister    Drug abuse Brother    Hypertension Brother    Diabetes Brother    Cancer Father        Lymphoma    Social History   Socioeconomic History   Marital status: Married    Spouse name: Garment/textile technologist   Number of children: 3   Years of education: 12   Highest education level: Not on file  Occupational History   Occupation:  Shipping    Employer: [employer]or, [employer]  Tobacco Use   Smoking status: Never   Smokeless tobacco: Never  Vaping Use   Vaping Use: Never used  Substance and Sexual Activity   Alcohol use: No   Drug use: No   Sexual activity: Yes    Partners: Male  Other Topics Concern   Not on file  Social History Narrative   Marital Status: Married Geneticist, molecular)   Children:  Sons Jeri [place], [place] [Lucas]); Daughter Leotis Shames)    Living Situation:  Lives with spouse and children.  Occupation: Chief Financial Officer)   Education:  Engineer, agricultural   Tobacco Use/Exposure:  None    Alcohol Use: None   Drug Use:  None   Diet:  Regular   Exercise:  Minimal   Hobbies:  Reading, Traveling, Movies, Archivist)             Social Determinants of Corporate investment banker Strain: Not on file  Food Insecurity: Not on file  Transportation Needs: Not on file  Physical Activity: Not on file  Stress: Not on file  Social Connections: Not on file  Intimate Partner Violence: Not on file    Review of Systems  Constitutional:  Negative for chills and fever.  Respiratory:  Negative for shortness of breath.   Cardiovascular:  Negative for chest pain.  Gastrointestinal:  Positive for constipation. Negative for abdominal pain, nausea and vomiting.  Musculoskeletal:  Positive for back pain. Negative for falls.  Neurological:  Negative for dizziness and headaches.  Endo/Heme/Allergies:  Negative for polydipsia.  Psychiatric/Behavioral:  Negative for depression and suicidal ideas.         Objective    BP 130/74 (BP Location: Right Arm, Patient Position: Sitting, Cuff Size: Large)   Pulse 80   Temp 97.7 F (36.5 C) (Oral)   Ht 5\' 2"  (1.575 m)   Wt 269 lb 9.6 oz (122.3 kg)   SpO2 95%   BMI 49.31 kg/m   Physical Exam Vitals and nursing note reviewed.  Constitutional:      Appearance: Normal appearance. She is obese.  Cardiovascular:     Rate and Rhythm: Regular rhythm.     Heart sounds: Normal heart  sounds.  Pulmonary:     Effort: Pulmonary effort is normal.     Breath sounds: Normal breath sounds.  Skin:    General: Skin is warm and dry.     Capillary Refill: Capillary refill takes less than 2 seconds.  Neurological:     General: No focal deficit present.     Mental Status: She is alert. Mental status is at baseline.  Psychiatric:        Mood and Affect: Mood normal.        Behavior: Behavior normal.        Thought Content: Thought content normal.        Judgment: Judgment normal.       Assessment & Plan:   Problem List Items Addressed This Visit   Establishing care with new doctor, encounter for  Essential hypertension  Taking clonidine 0.1 mg BID, Avapro 300 mg QD, and Lasix 40 mg QD as prescribed. Denies chest pain, shortness of breath, vision changes, and headaches. Last CMP on 07/20/23 shows GFR > 90. Will repeat labs at CPE visit in one month. No refills needed today.   Type 2 diabetes mellitus without complication, without long-term current use of insulin  Does not monitor blood sugar at home. Libre 3 sensor sent to pharmacy. 10/24/22 POC A1C 7.4. Due on 01/22/23. Lifestyle modification reviewed. Has successfully lost weight. Refill on semaglutide.    -     FreeStyle Libre 3 Sensor; 1 each by Does not apply route 2 (two) times daily. Place 1 sensor on the skin every 14 days. Use to check glucose continuously  Dispense: 2 each; Refill: 2 -     Semaglutide (1 MG/DOSE); Inject 1 mg as directed once a week.  Dispense: 3 mL; Refill: 1  Screening for colon cancer -     Ambulatory referral  to Gastroenterology  Chronic bilateral low back pain without sciatica  This is a chronic problem. Continues to have spasms in low back. Refill sent.    -     Methocarbamol; Take 1 tablet (500 mg total) by mouth every 6 (six) hours as needed for muscle spasms.  Dispense: 30 tablet; Refill: 1  Gastroesophageal reflux disease without esophagitis  Takes omeprazole daily. Symptoms are  well controlled. No refills needed today.   Moderate asthma, unspecified whether complicated, unspecified whether persistent  Uses Symbicort, Combivent, and montelukast, has not needed rescue inhaler. Does not use albuterol. Symptoms are well controlled.      Agrees with plan of care discussed.  Questions answered.   Return in about 4 weeks (around 01/15/2023) for cpe with labs .   Novella Olive, FNP

## 2023-01-02 ENCOUNTER — Encounter: Payer: Self-pay | Admitting: Family Medicine

## 2023-01-03 ENCOUNTER — Telehealth: Payer: Self-pay

## 2023-01-03 NOTE — Telephone Encounter (Signed)
Freestyle Libre 3 Sensor has been denied by [employer]. For any questions or concerns please call [phone].

## 2023-01-08 ENCOUNTER — Other Ambulatory Visit: Payer: Self-pay | Admitting: Family Medicine

## 2023-01-08 DIAGNOSIS — E119 Type 2 diabetes mellitus without complications: Secondary | ICD-10-CM

## 2023-01-08 MED ORDER — FREESTYLE LIBRE 3 SENSOR MISC: 1.0000 | Freq: Two times a day (BID) | 2 refills | Status: DC

## 2023-01-08 MED ORDER — FREESTYLE LIBRE 3 SENSOR MISC: 1.0000 | Freq: Two times a day (BID) | 6 refills | Status: DC

## 2023-01-08 NOTE — Progress Notes (Signed)
Prescription for [facility] sent to [facility] and [facility].

## 2023-01-08 NOTE — Telephone Encounter (Signed)
Lvm to make pt aware of message below. °

## 2023-01-15 ENCOUNTER — Ambulatory Visit (INDEPENDENT_AMBULATORY_CARE_PROVIDER_SITE_OTHER): Payer: Medicare Other | Admitting: Family Medicine

## 2023-01-15 ENCOUNTER — Encounter: Payer: Self-pay | Admitting: Family Medicine

## 2023-01-15 VITALS — BP 118/73 | HR 69 | Temp 98.0°F | Resp 18 | Ht 62.0 in | Wt 260.4 lb

## 2023-01-15 DIAGNOSIS — G8929 Other chronic pain: Secondary | ICD-10-CM

## 2023-01-15 DIAGNOSIS — M62838 Other muscle spasm: Secondary | ICD-10-CM

## 2023-01-15 DIAGNOSIS — Z13220 Encounter for screening for lipoid disorders: Secondary | ICD-10-CM

## 2023-01-15 DIAGNOSIS — Z0000 Encounter for general adult medical examination without abnormal findings: Secondary | ICD-10-CM

## 2023-01-15 DIAGNOSIS — Z6841 Body Mass Index (BMI) 40.0 and over, adult: Secondary | ICD-10-CM

## 2023-01-15 DIAGNOSIS — Z1283 Encounter for screening for malignant neoplasm of skin: Secondary | ICD-10-CM

## 2023-01-15 DIAGNOSIS — I10 Essential (primary) hypertension: Secondary | ICD-10-CM | POA: Diagnosis not present

## 2023-01-15 DIAGNOSIS — E119 Type 2 diabetes mellitus without complications: Secondary | ICD-10-CM

## 2023-01-15 DIAGNOSIS — M545 Low back pain, unspecified: Secondary | ICD-10-CM | POA: Diagnosis not present

## 2023-01-15 DIAGNOSIS — Z13228 Encounter for screening for other metabolic disorders: Secondary | ICD-10-CM

## 2023-01-15 DIAGNOSIS — Z136 Encounter for screening for cardiovascular disorders: Secondary | ICD-10-CM

## 2023-01-15 MED ORDER — CLONIDINE HCL 0.1 MG PO TABS: 0.1000 mg | ORAL_TABLET | Freq: Two times a day (BID) | ORAL | 1 refills | Status: DC

## 2023-01-15 MED ORDER — SEMAGLUTIDE (1 MG/DOSE) 4 MG/3ML ~~LOC~~ SOPN: 1.0000 mg | PEN_INJECTOR | SUBCUTANEOUS | 1 refills | Status: DC

## 2023-01-15 MED ORDER — METHOCARBAMOL 500 MG PO TABS: 500.0000 mg | ORAL_TABLET | Freq: Four times a day (QID) | ORAL | 1 refills | Status: DC | PRN

## 2023-01-15 MED ORDER — IRBESARTAN 300 MG PO TABS: 300.0000 mg | ORAL_TABLET | Freq: Every day | ORAL | 1 refills | Status: DC

## 2023-01-15 NOTE — Progress Notes (Signed)
Complete physical exam  Patient: Megan Petersen   DOB: 1960/10/31   62 y.o. Female  MRN: [id number]  Subjective:    Chief Complaint  Patient presents with   Annual Exam    Patient is here for her annual physical and she is fasting this morning. Patient would like for you to look at a mole on the back of her neck in the hairline    Megan Petersen is a 62 y.o. female who presents today for a complete physical exam. She reports consuming a  watching what eats and watching blood sugar with continous monitor  diet. Home exercise routine includes walking with grandchildren . She generally feels well. She reports sleeping well. She does have additional problems to discuss today.   Lost 9 pounds recently intentionally since using Freestyle Libre monitor. Observing how foods affect her blood sugar and making better choices.   Mole on neck: left side of hairline, brown in color, will send to derm for check.   Hysterectomy: Sees Dr. Karis Juba GYN, does GYN care.   GI referral placed, has not scheduled appointment yet. Encouraged to do so.   Most recent fall risk assessment:    12/18/2022    9:47 AM  Fall Risk   Falls in the past year? 0  Number falls in past yr: 0  Injury with Fall? 0  Risk for fall due to : No Fall Risks  Follow up Falls evaluation completed     Most recent depression screenings:    12/18/2022    9:47 AM 05/14/2013    3:00 PM  PHQ 2/9 Scores  PHQ - 2 Score 0 1  Exception Documentation Medical reason     Vision:Within last year and Dental: No current dental problems and Receives regular dental care    Patient Care Team: Novella Olive, FNP as PCP - General (Family Medicine)   Outpatient Medications Prior to Visit  Medication Sig   Continuous Glucose Sensor (FREESTYLE LIBRE 3 SENSOR) MISC 1 each by Does not apply route 2 (two) times daily. Place 1 sensor on the skin every 14 days. Use to check glucose continuously   Continuous Glucose Sensor (FREESTYLE LIBRE 3  SENSOR) MISC 1 each by Does not apply route 2 (two) times daily. Place 1 sensor on the skin every 14 days. Use to check glucose continuously   Continuous Glucose Sensor (FREESTYLE LIBRE 3 SENSOR) MISC 1 each by Does not apply route 2 (two) times daily. Place 1 sensor on the skin every 14 days. Use to check glucose continuously   diclofenac sodium (VOLTAREN) 1 % GEL Apply 2-4 g 4 (four) times daily as needed topically (for pain.).   docusate sodium (STOOL SOFTENER) 250 MG capsule Take 250 mg daily by mouth.   Ipratropium-Albuterol (COMBIVENT RESPIMAT) 20-100 MCG/ACT AERS respimat Inhale 1 puff into the lungs every 6 (six) hours as needed for wheezing.   lidocaine (LIDODERM) 5 % Place 1-3 patches daily as needed onto the skin (pain). Remove & Discard patch within 12 hours or as directed by MD    traMADol (ULTRAM) 50 MG tablet Take 1 tablet (50 mg total) by mouth every 6 (six) hours as needed for moderate pain. (Patient taking differently: Take 50 mg by mouth 3 (three) times daily as needed for moderate pain (for pain).)   [DISCONTINUED] cloNIDine (CATAPRES) 0.1 MG tablet Take 0.1 mg 2 (two) times daily by mouth. Morning & afternoon.   [DISCONTINUED] irbesartan (AVAPRO) 300 MG tablet Take 300  mg daily by mouth.   [DISCONTINUED] methocarbamol (ROBAXIN) 500 MG tablet Take 1 tablet (500 mg total) by mouth every 6 (six) hours as needed for muscle spasms.   [DISCONTINUED] Semaglutide, 1 MG/DOSE, 4 MG/3ML SOPN Inject 1 mg as directed once a week.   budesonide-formoterol (SYMBICORT) 160-4.5 MCG/ACT inhaler Inhale 2 puffs into the lungs 2 (two) times daily. (Patient taking differently: Inhale 2 puffs daily into the lungs. )   esomeprazole (NEXIUM) 40 MG capsule Take 1 capsule (40 mg total) by mouth daily before breakfast.   furosemide (LASIX) 40 MG tablet Take 0.5 tablets (20 mg total) by mouth daily.   montelukast (SINGULAIR) 10 MG tablet Take 1 tablet (10 mg total) by mouth at bedtime.   No  facility-administered medications prior to visit.    Review of Systems  Constitutional:  Negative for chills and fever.  Eyes:  Negative for blurred vision and double vision.  Respiratory:  Negative for shortness of breath.   Cardiovascular:  Negative for chest pain.  Gastrointestinal:  Negative for abdominal pain, nausea and vomiting.  Neurological:  Negative for dizziness and headaches.  Psychiatric/Behavioral:  Negative for depression and suicidal ideas.           Objective:     BP 118/73   Pulse 69   Temp 98 F (36.7 C) (Oral)   Resp 18   Ht 5\' 2"  (1.575 m)   Wt 260 lb 6.4 oz (118.1 kg)   SpO2 94%   BMI 47.63 kg/m  BP Readings from Last 3 Encounters:  01/15/23 118/73  12/18/22 130/74  11/21/22 (!) 179/96      Physical Exam Vitals and nursing note reviewed.  Constitutional:      General: She is not in acute distress.    Appearance: Normal appearance. She is obese.  HENT:     Right Ear: Tympanic membrane normal.     Left Ear: Tympanic membrane normal.     Nose: Nose normal.     Mouth/Throat:     Mouth: Mucous membranes are moist.     Pharynx: Oropharynx is clear.  Eyes:     Extraocular Movements: Extraocular movements intact.  Neck:     Thyroid: No thyroid tenderness.  Cardiovascular:     Rate and Rhythm: Normal rate and regular rhythm.     Pulses: Normal pulses.     Heart sounds: Normal heart sounds.     Comments: LLE without edema, this is chronic.  Pulmonary:     Effort: Pulmonary effort is normal.     Breath sounds: Normal breath sounds.  Abdominal:     General: Bowel sounds are normal.     Palpations: Abdomen is soft.     Tenderness: There is no abdominal tenderness.  Musculoskeletal:     Right lower leg: 1+ Edema present.  Lymphadenopathy:     Cervical:     Right cervical: No superficial cervical adenopathy.    Left cervical: No superficial cervical adenopathy.  Skin:    General: Skin is warm and dry.     Capillary Refill: Capillary  refill takes less than 2 seconds.     Comments: Dark colored mole on left side nape of neck.   Neurological:     General: No focal deficit present.     Mental Status: She is alert. Mental status is at baseline.  Psychiatric:        Mood and Affect: Mood normal.        Behavior: Behavior normal.  Thought Content: Thought content normal.        Judgment: Judgment normal.     No results found for any visits on 01/15/23.     Assessment & Plan:    Routine Health Maintenance and Physical Exam  Immunization History  Administered Date(s) Administered   Influenza,inj,Quad PF,6+ Mos 05/30/2016, 06/17/2017, 07/07/2019, 09/17/2021   PFIZER(Purple Top)SARS-COV-2 Vaccination 11/27/2019, 12/25/2019   Pneumococcal Polysaccharide-23 03/23/2009   Tdap 09/01/2008   Zoster, Live 03/20/2012    Health Maintenance  Topic Date Due   Diabetic kidney evaluation - Urine ACR  Never done   Zoster Vaccines- Shingrix (1 of 2) Never done   HEMOGLOBIN A1C  11/07/2013   Diabetic kidney evaluation - eGFR measurement  07/17/2018   DTaP/Tdap/Td (2 - Td or Tdap) 09/01/2018   COLONOSCOPY (Pts 45-60yrs Insurance coverage will need to be confirmed)  12/18/2022   COVID-19 Vaccine (3 - 2023-24 season) 01/31/2023 (Originally 04/26/2022)   PAP SMEAR-Modifier  03/27/2023 (Originally 03/21/2015)   Hepatitis C Screening  01/15/2024 (Originally 07/23/1978)   HIV Screening  01/15/2024 (Originally 07/24/1975)   Medicare Annual Wellness (AWV)  03/19/2023   INFLUENZA VACCINE  03/27/2023   OPHTHALMOLOGY EXAM  08/10/2023   FOOT EXAM  01/15/2024   MAMMOGRAM  08/01/2024   HPV VACCINES  Aged Out    Discussed health benefits of physical activity, and encouraged her to engage in regular exercise appropriate for her age and condition.  Annual physical exam -     Comprehensive metabolic panel  Encounter for lipid screening for cardiovascular disease -     Lipid panel  Type 2 diabetes mellitus without complication,  without long-term current use of insulin (HCC) -     CBC with Differential/Platelet -     Comprehensive metabolic panel -     Hemoglobin A1c -     Lipid panel -     Microalbumin / creatinine urine ratio -     Semaglutide (1 MG/DOSE); Inject 1 mg as directed once a week.  Dispense: 9 mL; Refill: 1  Encounter for screening for metabolic disorder -     Comprehensive metabolic panel -     Hemoglobin A1c  BMI 45.0-49.9, adult (HCC) -     TSH + free T4  Screening for skin cancer -     Ambulatory referral to Dermatology  Chronic bilateral low back pain without sciatica  Muscle spasms of both lower extremities -     Methocarbamol; Take 1 tablet (500 mg total) by mouth every 6 (six) hours as needed for muscle spasms.  Dispense: 90 tablet; Refill: 1  Essential hypertension -     cloNIDine HCl; Take 1 tablet (0.1 mg total) by mouth 2 (two) times daily. Morning & afternoon.  Dispense: 180 tablet; Refill: 1 -     Irbesartan; Take 1 tablet (300 mg total) by mouth daily.  Dispense: 90 tablet; Refill: 1    Routine labs ordered.  HCM reviewed/discussed. History of hysterectomy, sees GYN for follow-up. Is actively losing weight, down 9 pounds so far. GI referral placed at previous visit, has not scheduled appointment yet. Anticipatory guidance regarding healthy weight, lifestyle and choices given. Recommend healthy diet.  Recommend approximately 150 minutes/week of moderate intensity exercise. Resistance training is good for building muscles and for bone health. Muscle mass helps to increase our metabolism and to burn more calories at rest.  Limit alcohol consumption: no more than one drink per day for women and 2 drinks per day for me. Recommend regular  dental and vision exams. Always use seatbelt/lap and shoulder restraints. Recommend using smoke alarms and checking batteries at least twice a year. Recommend using sunscreen when outside.  Please know that I am here to help you with all of your  health care goals and am happy to work with you to find a solution that works best for you.  The greatest advice I have received with any changes in life are to take it one step at a time, that even means if all you can focus on is the next 60 seconds, then do that and celebrate your victories.  With any changes in life, you will have set backs, and that is OK. The important thing to remember is, if you have a set back, it is not a failure, it is an opportunity to try again! Agrees with plan of care discussed.  Questions answered.  Return in about 3 months (around 04/17/2023) for dm, htn.     Novella Olive, FNP

## 2023-01-16 LAB — CBC WITH DIFFERENTIAL/PLATELET
Basophils Absolute: 0.1 10*3/uL (ref 0.0–0.2)
Basos: 1 %
EOS (ABSOLUTE): 0.2 10*3/uL (ref 0.0–0.4)
Eos: 2 %
Hematocrit: 43.1 % (ref 34.0–46.6)
Hemoglobin: 14.4 g/dL (ref 11.1–15.9)
Immature Grans (Abs): 0 10*3/uL (ref 0.0–0.1)
Immature Granulocytes: 0 %
Lymphocytes Absolute: 4.2 10*3/uL — ABNORMAL HIGH (ref 0.7–3.1)
Lymphs: 44 %
MCH: 28.1 pg (ref 26.6–33.0)
MCHC: 33.4 g/dL (ref 31.5–35.7)
MCV: 84 fL (ref 79–97)
Monocytes Absolute: 0.5 10*3/uL (ref 0.1–0.9)
Monocytes: 5 %
Neutrophils Absolute: 4.7 10*3/uL (ref 1.4–7.0)
Neutrophils: 48 %
Platelets: 332 10*3/uL (ref 150–450)
RBC: 5.12 x10E6/uL (ref 3.77–5.28)
RDW: 13.6 % (ref 11.7–15.4)
WBC: 9.6 10*3/uL (ref 3.4–10.8)

## 2023-01-16 LAB — HEMOGLOBIN A1C
Est. average glucose Bld gHb Est-mCnc: 169 mg/dL
Hgb A1c MFr Bld: 7.5 % — ABNORMAL HIGH (ref 4.8–5.6)

## 2023-01-16 LAB — MICROALBUMIN / CREATININE URINE RATIO
Creatinine, Urine: 59.4 mg/dL
Microalb/Creat Ratio: <5 mg/g creat (ref 0–29)
Microalbumin, Urine: <3 ug/mL

## 2023-01-16 LAB — COMPREHENSIVE METABOLIC PANEL
ALT: 35 IU/L — ABNORMAL HIGH (ref 0–32)
AST: 32 IU/L (ref 0–40)
Albumin/Globulin Ratio: 1.4 (ref 1.2–2.2)
Albumin: 4.2 g/dL (ref 3.9–4.9)
Alkaline Phosphatase: 121 IU/L (ref 44–121)
BUN/Creatinine Ratio: 16 (ref 12–28)
BUN: 12 mg/dL (ref 8–27)
Bilirubin Total: 0.5 mg/dL (ref 0.0–1.2)
CO2: 23 mmol/L (ref 20–29)
Calcium: 9.5 mg/dL (ref 8.7–10.3)
Chloride: 100 mmol/L (ref 96–106)
Creatinine, Ser: 0.76 mg/dL (ref 0.57–1.00)
Globulin, Total: 2.9 g/dL (ref 1.5–4.5)
Glucose: 133 mg/dL — ABNORMAL HIGH (ref 70–99)
Potassium: 4.7 mmol/L (ref 3.5–5.2)
Sodium: 140 mmol/L (ref 134–144)
Total Protein: 7.1 g/dL (ref 6.0–8.5)
eGFR: 89 mL/min/{1.73_m2} (ref >59)

## 2023-01-16 LAB — LIPID PANEL
Chol/HDL Ratio: 4.2 ratio (ref 0.0–4.4)
Cholesterol, Total: 150 mg/dL (ref 100–199)
HDL: 36 mg/dL — ABNORMAL LOW (ref >39)
LDL Chol Calc (NIH): 88 mg/dL (ref 0–99)
Triglycerides: 149 mg/dL (ref 0–149)
VLDL Cholesterol Cal: 26 mg/dL (ref 5–40)

## 2023-01-16 LAB — TSH+FREE T4
Free T4: 1.06 ng/dL (ref 0.82–1.77)
TSH: 1.86 u[IU]/mL (ref 0.450–4.500)

## 2023-01-22 ENCOUNTER — Encounter: Payer: Self-pay | Admitting: Family Medicine

## 2023-04-16 ENCOUNTER — Ambulatory Visit (INDEPENDENT_AMBULATORY_CARE_PROVIDER_SITE_OTHER): Payer: Medicare Other | Admitting: Family Medicine

## 2023-04-16 ENCOUNTER — Encounter: Payer: Self-pay | Admitting: Family Medicine

## 2023-04-16 ENCOUNTER — Other Ambulatory Visit: Payer: Self-pay | Admitting: Family Medicine

## 2023-04-16 VITALS — BP 128/76 | HR 82 | Temp 98.1°F | Resp 18 | Ht 62.0 in | Wt 267.7 lb

## 2023-04-16 DIAGNOSIS — E119 Type 2 diabetes mellitus without complications: Secondary | ICD-10-CM

## 2023-04-16 DIAGNOSIS — Z0000 Encounter for general adult medical examination without abnormal findings: Secondary | ICD-10-CM | POA: Diagnosis not present

## 2023-04-16 NOTE — Progress Notes (Signed)
Subjective:   Megan Petersen is a 62 y.o. female who presents for Medicare Annual (Subsequent) preventive examination.  Visit Complete: In person  Patient Medicare AWV questionnaire was completed by the patient on 04/16/23; I have confirmed that all information answered by patient is correct and no changes since this date.  Review of Systems    No chest pain or shortness of breath Cardiac Risk Factors include: diabetes mellitus;obesity (BMI >30kg/m2);sedentary lifestyle     Objective:    Today's Vitals   04/16/23 0832 04/16/23 0906  BP: (!) 144/75 128/76  Pulse: 82   Resp: 18   Temp: 98.1 F (36.7 C)   TempSrc: Oral   SpO2: 95%   Weight: 267 lb 11.2 oz (121.4 kg)   Height: 5\' 2"  (1.575 m)    Body mass index is 48.96 kg/m.     04/16/2023    8:48 AM 08/01/2017    2:10 PM 07/16/2017    1:30 PM 07/10/2017   10:26 AM 08/16/2016   10:37 AM 07/02/2016   10:26 AM 06/13/2016   12:29 PM  Advanced Directives  Does Patient Have a Medical Advance Directive? No No No No No No No  Would patient like information on creating a medical advance directive? Yes (MAU/Ambulatory/Procedural Areas - Information given) No - Patient declined No - Patient declined No - Patient declined  No - patient declined information No - patient declined information    Current Medications (verified) Outpatient Encounter Medications as of 04/16/2023  Medication Sig   budesonide-formoterol (SYMBICORT) 160-4.5 MCG/ACT inhaler Inhale 2 puffs into the lungs 2 (two) times daily. (Patient taking differently: Inhale 2 puffs daily into the lungs. )   cloNIDine (CATAPRES) 0.1 MG tablet Take 1 tablet (0.1 mg total) by mouth 2 (two) times daily. Morning & afternoon.   Continuous Glucose Sensor (FREESTYLE LIBRE 3 SENSOR) MISC 1 each by Does not apply route 2 (two) times daily. Place 1 sensor on the skin every 14 days. Use to check glucose continuously   Continuous Glucose Sensor (FREESTYLE LIBRE 3 SENSOR) MISC 1 each by  Does not apply route 2 (two) times daily. Place 1 sensor on the skin every 14 days. Use to check glucose continuously   Continuous Glucose Sensor (FREESTYLE LIBRE 3 SENSOR) MISC 1 each by Does not apply route 2 (two) times daily. Place 1 sensor on the skin every 14 days. Use to check glucose continuously   diclofenac sodium (VOLTAREN) 1 % GEL Apply 2-4 g 4 (four) times daily as needed topically (for pain.).   docusate sodium (STOOL SOFTENER) 250 MG capsule Take 250 mg daily by mouth.   esomeprazole (NEXIUM) 40 MG capsule Take 1 capsule (40 mg total) by mouth daily before breakfast.   furosemide (LASIX) 40 MG tablet Take 0.5 tablets (20 mg total) by mouth daily.   Ipratropium-Albuterol (COMBIVENT RESPIMAT) 20-100 MCG/ACT AERS respimat Inhale 1 puff into the lungs every 6 (six) hours as needed for wheezing.   irbesartan (AVAPRO) 300 MG tablet Take 1 tablet (300 mg total) by mouth daily.   lidocaine (LIDODERM) 5 % Place 1-3 patches daily as needed onto the skin (pain). Remove & Discard patch within 12 hours or as directed by MD    methocarbamol (ROBAXIN) 500 MG tablet Take 1 tablet (500 mg total) by mouth every 6 (six) hours as needed for muscle spasms.   montelukast (SINGULAIR) 10 MG tablet Take 1 tablet (10 mg total) by mouth at bedtime.   Semaglutide, 1 MG/DOSE, 4  MG/3ML SOPN Inject 1 mg as directed once a week.   [DISCONTINUED] traMADol (ULTRAM) 50 MG tablet Take 1 tablet (50 mg total) by mouth every 6 (six) hours as needed for moderate pain. (Patient taking differently: Take 50 mg by mouth 3 (three) times daily as needed for moderate pain (for pain).)   No facility-administered encounter medications on file as of 04/16/2023.    Allergies (verified) Ace inhibitors, Betadine [povidone iodine], Chloraprep one step [chlorhexidine gluconate], Demerol [meperidine], Erythromycin, Lyrica [pregabalin], Penicillins, Shellfish allergy, and Codeine   History: Past Medical History:  Diagnosis Date    Allergy    Asthma    Atypical glandular cells on Pap smear    Esophageal reflux    Family history of adverse reaction to anesthesia    brother slow to awaken   GERD (gastroesophageal reflux disease)    Nexium controls   Headache    Hemorrhoids    history of- not a problem at present.   Hyperlipidemia    Hypertension    Impaired fasting glucose    Dr. Jenny Reichmann -told pt Hgb A1C level was good.   LSIL (low grade squamous intraepithelial lesion) on Pap smear    Morbid obesity (HCC)    Osteoarthritis    Bilateral knees   Osteoarthritis    Pneumonia 2016   PONV (postoperative nausea and vomiting)    Pre-diabetes    hgA1c on 06-09-17 was 5.7   Primary cough headache    Rosacea    Swelling of limb    Transfusion history    '88- s/p childbirth   Trigeminal neuralgia    Type 2 diabetes mellitus (HCC)    URI (upper respiratory infection)    cough with green sputum since 11-26-15   Vertigo    10 yrs ago- only-   Past Surgical History:  Procedure Laterality Date   CHOLECYSTECTOMY     history of gallstones   LEEP     right knee meniscus repair  2014   SHOULDER OPEN ROTATOR CUFF REPAIR Right 12/01/2015   Procedure: RIGHT SHOULDER MINI OPEN ROTATOR CUFF REPAIR WITH RESECTON OF DISTAL CLAVICLE AND SUBACROMIAL DECOMPRESSION;  Surgeon: Jene Every, MD;  Location: [facility];  Service: Orthopedics;  Laterality: Right;   TOTAL ABDOMINAL HYSTERECTOMY  07/04/2022   TOTAL KNEE ARTHROPLASTY Right 06/13/2016   Procedure: RIGHT TOTAL KNEE ARTHROPLASTY;  Surgeon: Jene Every, MD;  Location: [facility];  Service: Orthopedics;  Laterality: Right;   TOTAL KNEE ARTHROPLASTY Left 07/16/2017   Procedure: LEFT TOTAL KNEE ARTHROPLASTY;  Surgeon: Jene Every, MD;  Location: [facility];  Service: Orthopedics;  Laterality: Left;  2.5 hrs   Family History  Problem Relation Age of Onset   Heart disease Mother    Hypertension Mother    Cancer Mother        Ovarian   Drug abuse Sister    Hypertension Sister     Diabetes Sister    Drug abuse Brother    Hypertension Brother    Diabetes Brother    Cancer Father        Lymphoma   Social History   Socioeconomic History   Marital status: Married    Spouse name: Dennie Bible   Number of children: 3   Years of education: 12   Highest education level: Not on file  Occupational History   Occupation: Shipping    Employer: Surveyor, quantity  Tobacco Use   Smoking status: Never   Smokeless tobacco: Never  Vaping Use   Vaping status:  Never Used  Substance and Sexual Activity   Alcohol use: No   Drug use: No   Sexual activity: Yes    Partners: Male  Other Topics Concern   Not on file  Social History Narrative   Marital Status: Married Geneticist, molecular)   Children:  Sons Jeri [place], [place] [Lucas]); Daughter Leotis Shames)    Living Situation:  Lives with spouse and children.   Occupation: Chief Financial Officer)   Education:  Engineer, agricultural   Tobacco Use/Exposure:  None    Alcohol Use: None   Drug Use:  None   Diet:  Regular   Exercise:  Minimal   Hobbies:  Reading, Traveling, Movies, Archivist)             Social Determinants of Health   Financial Resource Strain: Low Risk  (04/16/2023)   Overall Financial Resource Strain (CARDIA)    Difficulty of Paying Living Expenses: Not hard at all  Food Insecurity: No Food Insecurity (04/16/2023)   Hunger Vital Sign    Worried About Running Out of Food in the Last Year: Never true    Ran Out of Food in the Last Year: Never true  Transportation Needs: No Transportation Needs (04/16/2023)   PRAPARE - Administrator, Civil Service (Medical): No    Lack of Transportation (Non-Medical): No  Physical Activity: Insufficiently Active (04/16/2023)   Exercise Vital Sign    Days of Exercise per Week: 1 day    Minutes of Exercise per Session: 10 min  Stress: No Stress Concern Present (04/16/2023)   [employer] of Occupational Health - Occupational Stress Questionnaire    Feeling of Stress : Not at all  Social  Connections: Socially Integrated (04/16/2023)   Social Connection and Isolation Panel [NHANES]    Frequency of Communication with Friends and Family: More than three times a week    Frequency of Social Gatherings with Friends and Family: More than three times a week    Attends Religious Services: More than 4 times per year    Active Member of [employer] or Organizations: Yes    Attends Engineer, structural: More than 4 times per year    Marital Status: Married    Tobacco Counseling Does not smoke, counseling   Clinical Intake:  Pre-visit preparation completed: No  Pain : No/denies pain     BMI - recorded: 48.9 Nutritional Status: BMI > 30  Obese Nutritional Risks: None Diabetes: Yes CBG done?: No Did pt. bring in CBG monitor from home?: Yes Glucose Meter Downloaded?: Comment (has been using [employer] and does not have it on)  How often do you need to have someone help you when you read instructions, pamphlets, or other written materials from your doctor or pharmacy?: 1 - Never  Interpreter Needed?: No      Activities of Daily Living    04/16/2023    8:46 AM 12/18/2022    9:47 AM  In your present state of health, do you have any difficulty performing the following activities:  Hearing? 0 0  Vision? 0 0  Difficulty concentrating or making decisions? 0 0  Walking or climbing stairs? 0 0  Dressing or bathing? 0 0  Doing errands, shopping? 0 0  Preparing Food and eating ? N   Using the Toilet? N   In the past six months, have you accidently leaked urine? N   Do you have problems with loss of bowel control? N   Managing your Medications? N   Managing  your Finances? N   Housekeeping or managing your Housekeeping? N     Patient Care Team: Novella Olive, FNP as PCP - General (Family Medicine) [facility] Lynnea Ferrier., MD as Referring Physician (Obstetrics and Gynecology)  Indicate any recent Medical Services you may have received from other than [facility]  providers in the past year (date may be approximate).     Assessment:   This is a routine wellness examination for Milton.  Hearing/Vision screen No results found.  Dietary issues and exercise activities discussed:     Goals Addressed             This Visit's Progress    Disease Progression Prevented or Minimized   Not on track    Evidence-based guidance:   Prepare patient for laboratory and diagnostic exams based on risk and presentation.  Encourage lifestyle changes, such as increased intake of plant-based foods, stress reduction, consistent physical activity and smoking cessation to prevent long-term complications and chronic disease.   Individualize activity and exercise recommendations while considering potential limitations, such as neuropathy, retinopathy or the ability to prevent hyperglycemia or hypoglycemia.   Assess signs/symptoms and risk factors for hypertension, sleep-disordered breathing, neuropathy (including changes in gait and balance), retinopathy, nephropathy and sexual dysfunction.  Prepare patient for use of pharmacologic therapy that may include antihypertensive, analgesic, statin after 63 years of age with periodic adjustments, based on presenting chronic condition and laboratory results.   Address pregnancy planning and contraceptive choice, especially when prescribing antihypertensive or statin.  Ensure completion of annual comprehensive foot exam and dilated eye exam beginning at puberty or 63 years of age (whichever is earlier) once the child has been diabetic for 5 years.  Implement additional individualized goals and interventions based on identified risk factors.  Prepare patient for consultation or referral for specialist care, such as ophthalmology, neurology, cardiology, podiatry, nephrology or perinatology.    Notes:       Depression Screen    04/16/2023    8:49 AM 04/16/2023    8:44 AM 12/18/2022    9:47 AM 05/14/2013    3:00 PM  PHQ 2/9  Scores  PHQ - 2 Score 0 0 0 1  Exception Documentation   Medical reason     Fall Risk    04/16/2023    8:48 AM 12/18/2022    9:47 AM 05/14/2013    3:00 PM  Fall Risk   Falls in the past year? 0 0 No  Number falls in past yr: 0 0   Injury with Fall? 0 0   Risk for fall due to : No Fall Risks No Fall Risks   Follow up  Falls evaluation completed     MEDICARE RISK AT HOME: Medicare Risk at Home Any stairs in or around the home?: Yes If so, are there any without handrails?: Yes Home free of loose throw rugs in walkways, pet beds, electrical cords, etc?: No Adequate lighting in your home to reduce risk of falls?: Yes Life alert?: No Use of a cane, walker or w/c?: No Grab bars in the bathroom?: No Shower chair or bench in shower?: No Elevated toilet seat or a handicapped toilet?: No  TIMED UP AND GO:  Was the test performed?  Yes  Length of time to ambulate 10 feet: 2 sec Gait steady and fast without use of assistive device    Cognitive Function:        04/16/2023    8:50 AM  6CIT Screen  What Year?  0 points  What month? 0 points  What time? 0 points  Count back from 20 0 points  Months in reverse 0 points  Repeat phrase 0 points  Total Score 0 points    Immunizations Immunization History  Administered Date(s) Administered   Influenza,inj,Quad PF,6+ Mos 05/30/2016, 06/17/2017, 07/07/2019, 09/17/2021   PFIZER(Purple Top)SARS-COV-2 Vaccination 11/27/2019, 12/25/2019   Pneumococcal Polysaccharide-23 03/23/2009   Tdap 09/01/2008   Zoster, Live 03/20/2012    TDAP status: Due, Education has been provided regarding the importance of this vaccine. Advised may receive this vaccine at local pharmacy or Health Dept. Aware to provide a copy of the vaccination record if obtained from local pharmacy or Health Dept. Verbalized acceptance and understanding.  Flu Vaccine status: Declined, Education has been provided regarding the importance of this vaccine but patient still  declined. Advised may receive this vaccine at local pharmacy or Health Dept. Aware to provide a copy of the vaccination record if obtained from local pharmacy or Health Dept. Verbalized acceptance and understanding.  Pneumococcal vaccine status: Declined,  Education has been provided regarding the importance of this vaccine but patient still declined. Advised may receive this vaccine at local pharmacy or Health Dept. Aware to provide a copy of the vaccination record if obtained from local pharmacy or Health Dept. Verbalized acceptance and understanding.   Covid-19 vaccine status: Declined, Education has been provided regarding the importance of this vaccine but patient still declined. Advised may receive this vaccine at local pharmacy or Health Dept.or vaccine clinic. Aware to provide a copy of the vaccination record if obtained from local pharmacy or Health Dept. Verbalized acceptance and understanding.  Qualifies for Shingles Vaccine? Yes   Zostavax completed Yes   Shingrix Completed?: No.    Education has been provided regarding the importance of this vaccine. Patient has been advised to call insurance company to determine out of pocket expense if they have not yet received this vaccine. Advised may also receive vaccine at local pharmacy or Health Dept. Verbalized acceptance and understanding.  Screening Tests Health Maintenance  Topic Date Due   Zoster Vaccines- Shingrix (1 of 2) 07/23/2010   DTaP/Tdap/Td (2 - Td or Tdap) 09/01/2018   Colonoscopy  12/15/2022   COVID-19 Vaccine (3 - 2023-24 season) 05/05/2023 (Originally 04/26/2022)   INFLUENZA VACCINE  11/24/2023 (Originally 03/27/2023)   HIV Screening  01/15/2024 (Originally 07/24/1976)   HEMOGLOBIN A1C  07/18/2023   OPHTHALMOLOGY EXAM  08/10/2023   Diabetic kidney evaluation - eGFR measurement  01/15/2024   Diabetic kidney evaluation - Urine ACR  01/15/2024   FOOT EXAM  01/15/2024   Medicare Annual Wellness (AWV)  04/15/2024   MAMMOGRAM   08/01/2024   Hepatitis C Screening  Completed   HPV VACCINES  Aged Out    Health Maintenance  Health Maintenance Due  Topic Date Due   Zoster Vaccines- Shingrix (1 of 2) 07/23/2010   DTaP/Tdap/Td (2 - Td or Tdap) 09/01/2018   Colonoscopy  12/15/2022    Colorectal cancer screening: Referral to GI placed 12/18/22. Pt aware the office will call re: appt.  Mammogram status: Completed 08/17/22. Repeat every year  Bone Density status: Completed unsure of date. Results reflect: Bone density results: OSTEOPENIA. Repeat every 2 years.  Lung Cancer Screening: (Low Dose CT Chest recommended if Age 46-80 years, 20 pack-year currently smoking OR have quit w/in 15years.) does not qualify.   Lung Cancer Screening Referral: n/a  Additional Screening:  Hepatitis C Screening: does not qualify; Completed 06/01/16  Vision Screening:  Recommended annual ophthalmology exams for early detection of glaucoma and other disorders of the eye. Is the patient up to date with their annual eye exam?  Yes  Who is the provider or what is the name of the office in which the patient attends annual eye exams? [facility]  If pt is not established with a provider, would they like to be referred to a provider to establish care? No .   Dental Screening: Recommended annual dental exams for proper oral hygiene  Diabetic Foot Exam: Diabetic Foot Exam: Completed 01/15/24  Community Resource Referral / Chronic Care Management: CRR required this visit?  No   CCM required this visit?  No     Plan:     I have personally reviewed and noted the following in the patient's chart:   Medical and social history Use of alcohol, tobacco or illicit drugs  Current medications and supplements including opioid prescriptions. Patient is not currently taking opioid prescriptions. Functional ability and status Nutritional status Physical activity Advanced directives List of other physicians Hospitalizations, surgeries, and  ER visits in previous 12 months Vitals Screenings to include cognitive, depression, and falls Referrals and appointments  In addition, I have reviewed and discussed with patient certain preventive protocols, quality metrics, and best practice recommendations. A written personalized care plan for preventive services as well as general preventive health recommendations were provided to patient.     Novella Olive, FNP   04/16/2023   After Visit Summary: This visit is done in person she will get on My Chart  Nurse Notes: none

## 2023-05-27 LAB — HEMOGLOBIN A1C
Est. average glucose Bld gHb Est-mCnc: 157 mg/dL
Hgb A1c MFr Bld: 7.1 % — ABNORMAL HIGH (ref 4.8–5.6)

## 2023-05-28 ENCOUNTER — Encounter: Payer: Self-pay | Admitting: Family Medicine

## 2023-05-28 ENCOUNTER — Ambulatory Visit (INDEPENDENT_AMBULATORY_CARE_PROVIDER_SITE_OTHER): Payer: Medicare Other | Admitting: Family Medicine

## 2023-05-28 VITALS — BP 130/70 | HR 88 | Temp 98.9°F | Resp 18 | Ht 62.0 in | Wt 269.3 lb

## 2023-05-28 DIAGNOSIS — M25561 Pain in right knee: Secondary | ICD-10-CM

## 2023-05-28 DIAGNOSIS — Z1211 Encounter for screening for malignant neoplasm of colon: Secondary | ICD-10-CM

## 2023-05-28 DIAGNOSIS — J4520 Mild intermittent asthma, uncomplicated: Secondary | ICD-10-CM | POA: Insufficient documentation

## 2023-05-28 DIAGNOSIS — M62838 Other muscle spasm: Secondary | ICD-10-CM

## 2023-05-28 DIAGNOSIS — I10 Essential (primary) hypertension: Secondary | ICD-10-CM | POA: Diagnosis not present

## 2023-05-28 DIAGNOSIS — E119 Type 2 diabetes mellitus without complications: Secondary | ICD-10-CM

## 2023-05-28 DIAGNOSIS — M7989 Other specified soft tissue disorders: Secondary | ICD-10-CM | POA: Diagnosis not present

## 2023-05-28 DIAGNOSIS — K219 Gastro-esophageal reflux disease without esophagitis: Secondary | ICD-10-CM

## 2023-05-28 DIAGNOSIS — G8929 Other chronic pain: Secondary | ICD-10-CM

## 2023-05-28 DIAGNOSIS — M25562 Pain in left knee: Secondary | ICD-10-CM

## 2023-05-28 MED ORDER — INDOMETHACIN ER 75 MG PO CPCR: 75.0000 mg | ORAL_CAPSULE | Freq: Every day | ORAL | 1 refills | Status: DC

## 2023-05-28 MED ORDER — FUROSEMIDE 40 MG PO TABS: 20.0000 mg | ORAL_TABLET | Freq: Every day | ORAL | 1 refills | Status: DC

## 2023-05-28 MED ORDER — BUDESONIDE-FORMOTEROL FUMARATE 160-4.5 MCG/ACT IN AERO: 2.0000 | INHALATION_SPRAY | Freq: Every day | RESPIRATORY_TRACT | 5 refills | Status: AC

## 2023-05-28 MED ORDER — COMBIVENT RESPIMAT 20-100 MCG/ACT IN AERS: 1.0000 | INHALATION_SPRAY | Freq: Four times a day (QID) | RESPIRATORY_TRACT | 1 refills | Status: AC | PRN

## 2023-05-28 MED ORDER — MONTELUKAST SODIUM 10 MG PO TABS: 10.0000 mg | ORAL_TABLET | Freq: Every day | ORAL | 1 refills | Status: DC

## 2023-05-28 MED ORDER — IRBESARTAN 300 MG PO TABS: 300.0000 mg | ORAL_TABLET | Freq: Every day | ORAL | 1 refills | Status: DC

## 2023-05-28 MED ORDER — METHOCARBAMOL 500 MG PO TABS: 500.0000 mg | ORAL_TABLET | Freq: Every day | ORAL | 1 refills | Status: DC

## 2023-05-28 MED ORDER — ESOMEPRAZOLE MAGNESIUM 40 MG PO CPDR: 40.0000 mg | DELAYED_RELEASE_CAPSULE | Freq: Every day | ORAL | 1 refills | Status: DC

## 2023-05-28 MED ORDER — CLONIDINE HCL 0.1 MG PO TABS: 0.1000 mg | ORAL_TABLET | Freq: Two times a day (BID) | ORAL | 1 refills | Status: DC

## 2023-05-28 MED ORDER — SEMAGLUTIDE (1 MG/DOSE) 4 MG/3ML ~~LOC~~ SOPN: 1.0000 mg | PEN_INJECTOR | SUBCUTANEOUS | 1 refills | Status: DC

## 2023-05-28 NOTE — Assessment & Plan Note (Signed)
Taking semaglutide1 mg SQ weekly as prescribed.  Denies chest pain, shortness of breath, vision changes, polydipsia, polyphagia, polyuria. Denies hypoglycemia.  Pertinent lab work: A1C: 9/30 A1C 7.1 improved from 7.5  Unable to monitor glucose at home regularly due to cost. Attempted to get Freestyle Libre covered by her insurance.

## 2023-05-28 NOTE — Assessment & Plan Note (Signed)
Taking furosemide 20 mg daily,  irbesartan 300 mg daily,  and clonidine 0.1 mg BID  as prescribed.  Denies chest pain, shortness of breath, vision changes, headaches. Endorses  lower extremity edema, Pertinent lab work: 5/22: CMP normal GFR  Well-controlled in office. Does not monitor at home.

## 2023-05-28 NOTE — Assessment & Plan Note (Signed)
Colonoscopy due April 2024. Referral placed for screening colonoscopy.

## 2023-05-28 NOTE — Assessment & Plan Note (Signed)
Taking Robaxin 500 mg daily with good symptom control.

## 2023-05-28 NOTE — Progress Notes (Signed)
Established Patient Office Visit  Subjective   Patient ID: Megan Petersen, female    DOB: 1961-08-14  Age: 62 y.o. MRN: [id number]  Chief Complaint  Patient presents with   Hypertension   Diabetes    Follow-up     HPI  Hypertension Medication compliance: Taking furosemide 20 mg daily,  irbesartan 300 mg daily,  and clonidine 0.1 mg BID  as prescribed.  Denies chest pain, shortness of breath, vision changes, headaches. Endorses  lower extremity edema, Pertinent lab work: 5/22: CMP normal GFR  Monitoring at home: does not monitor  Tolerating medication well: no side effects reported.  Continue current medication regimen: no changes, refills sent  Follow-up: 6 months   Diabetes: Medication compliance: Taking semaglutide1 mg SQ weekly as prescribed.  Denies chest pain, shortness of breath, vision changes, polydipsia, polyphagia, polyuria. Denies hypoglycemia.  Pertinent lab work: A1C: 9/30 A1C 7.1 improved from 7.5  Monitoring: blood sugar readings at home: when wearing continous glucose monitor, not currently wearing due to cost          Continue current medication regimen: no changes   Well controlled: improved A1C   Follow-up: 6 months   GERD: esomeprazole 40 mg daily, symptoms well controlled. Refill sent.  Intermittent asthma: symptoms worse in fall, inhalers and montelukast 10 mg daily as presecribed. Refills sent.   Muscle spasms: controlled with Robaxin, refill sent.   Knee pain: controlled with indomethacin, refill sent.   Lower extremity swelling: taking furosemide 20 mg daily, refill sent.     Review of Systems  Eyes:  Negative for blurred vision and double vision.  Respiratory:  Negative for shortness of breath.   Cardiovascular:  Negative for chest pain.  Neurological:  Negative for headaches.      Objective:     BP 130/70 (BP Location: Right Arm, Patient Position: Sitting, Cuff Size: Large)   Pulse 88   Temp 98.9 F (37.2 C) (Oral)   Resp 18    Ht 5\' 2"  (1.575 m)   Wt 269 lb 4.8 oz (122.2 kg)   SpO2 96%   BMI 49.26 kg/m  BP Readings from Last 3 Encounters:  05/28/23 130/70  04/16/23 128/76  01/15/23 118/73      Physical Exam Vitals and nursing note reviewed.  Constitutional:      General: She is not in acute distress.    Appearance: Normal appearance. She is obese.  Cardiovascular:     Rate and Rhythm: Regular rhythm.     Heart sounds: Normal heart sounds.  Pulmonary:     Effort: Pulmonary effort is normal.     Breath sounds: Normal breath sounds.  Skin:    General: Skin is warm and dry.  Neurological:     General: No focal deficit present.     Mental Status: She is alert. Mental status is at baseline.  Psychiatric:        Mood and Affect: Mood normal.        Behavior: Behavior normal.        Thought Content: Thought content normal.        Judgment: Judgment normal.     No results found for any visits on 05/28/23.  Last metabolic panel Lab Results  Component Value Date   GLUCOSE 133 (H) 01/15/2023   NA 140 01/15/2023   K 4.7 01/15/2023   CL 100 01/15/2023   CO2 23 01/15/2023   BUN 12 01/15/2023   CREATININE 0.76 01/15/2023   EGFR 89 01/15/2023  CALCIUM 9.5 01/15/2023   PROT 7.1 01/15/2023   ALBUMIN 4.2 01/15/2023   LABGLOB 2.9 01/15/2023   AGRATIO 1.4 01/15/2023   BILITOT 0.5 01/15/2023   ALKPHOS 121 01/15/2023   AST 32 01/15/2023   ALT 35 (H) 01/15/2023   ANIONGAP 8 07/17/2017   Last hemoglobin A1c Lab Results  Component Value Date   HGBA1C 7.1 (H) 05/26/2023      The 10-year ASCVD risk score (Arnett DK, et al., 2019) is: 11.1%    Assessment & Plan:   Problem List Items Addressed This Visit     Knee pain, bilateral    Taking indomethacin 75 mg daily with good symptom control.       Relevant Medications   indomethacin (INDOCIN SR) 75 MG CR capsule   GERD (gastroesophageal reflux disease)    Taking esomeprazole 40 mg daily with good symptom control.       Relevant  Medications   esomeprazole (NEXIUM) 40 MG capsule   Type 2 diabetes mellitus (HCC) - Primary    Taking semaglutide1 mg SQ weekly as prescribed.  Denies chest pain, shortness of breath, vision changes, polydipsia, polyphagia, polyuria. Denies hypoglycemia.  Pertinent lab work: A1C: 9/30 A1C 7.1 improved from 7.5  Unable to monitor glucose at home regularly due to cost. Attempted to get Freestyle [employer]re covered by her insurance.       Relevant Medications   irbesartan (AVAPRO) 300 MG tablet   Semaglutide, 1 MG/DOSE, 4 MG/3ML SOPN   Essential hypertension    Taking furosemide 20 mg daily,  irbesartan 300 mg daily,  and clonidine 0.1 mg BID  as prescribed.  Denies chest pain, shortness of breath, vision changes, headaches. Endorses  lower extremity edema, Pertinent lab work: 5/22: CMP normal GFR  Well-controlled in office. Does not monitor at home.      Relevant Medications   cloNIDine (CATAPRES) 0.1 MG tablet   furosemide (LASIX) 40 MG tablet   irbesartan (AVAPRO) 300 MG tablet   Screening for colon cancer    Colonoscopy due April 2024. Referral placed for screening colonoscopy.       Relevant Orders   Ambulatory referral to Gastroenterology   Muscle spasms of both lower extremities    Taking Robaxin 500 mg daily with good symptom control.       Relevant Medications   methocarbamol (ROBAXIN) 500 MG tablet   Swelling of limb    Taking furosemide 20 mg daily with adequate  symptom control.       Relevant Medications   furosemide (LASIX) 40 MG tablet   Intermittent asthma without complication    Symptoms worse in fall. Taking Symbicort, Combivent, and montelukast as prescribe.      Relevant Medications   budesonide-formoterol (SYMBICORT) 160-4.5 MCG/ACT inhaler   Ipratropium-Albuterol (COMBIVENT RESPIMAT) 20-100 MCG/ACT AERS respimat   montelukast (SINGULAIR) 10 MG tablet  Agrees with plan of care discussed.  Questions answered.   Return in about 6 months (around  11/26/2023) for HTN, DM medication  management .    Novella Olive, FNP

## 2023-05-28 NOTE — Assessment & Plan Note (Signed)
Taking furosemide 20 mg daily with adequate  symptom control.

## 2023-05-28 NOTE — Assessment & Plan Note (Signed)
Taking indomethacin 75 mg daily with good symptom control.

## 2023-05-28 NOTE — Assessment & Plan Note (Signed)
Taking esomeprazole 40 mg daily with good symptom control.

## 2023-05-28 NOTE — Assessment & Plan Note (Signed)
Symptoms worse in fall. Taking Symbicort, Combivent, and montelukast as prescribe.

## 2023-09-29 ENCOUNTER — Ambulatory Visit (INDEPENDENT_AMBULATORY_CARE_PROVIDER_SITE_OTHER): Payer: Medicare Other | Admitting: Family Medicine

## 2023-09-29 ENCOUNTER — Encounter: Payer: Self-pay | Admitting: Family Medicine

## 2023-09-29 VITALS — BP 136/74 | HR 76 | Temp 98.7°F | Resp 18 | Ht 62.0 in | Wt 273.7 lb

## 2023-09-29 DIAGNOSIS — E119 Type 2 diabetes mellitus without complications: Secondary | ICD-10-CM | POA: Diagnosis not present

## 2023-09-29 DIAGNOSIS — I10 Essential (primary) hypertension: Secondary | ICD-10-CM | POA: Diagnosis not present

## 2023-09-29 DIAGNOSIS — L03116 Cellulitis of left lower limb: Secondary | ICD-10-CM | POA: Insufficient documentation

## 2023-09-29 MED ORDER — DOXYCYCLINE HYCLATE 100 MG PO TABS: 100.0000 mg | ORAL_TABLET | Freq: Two times a day (BID) | ORAL | 0 refills | Status: DC

## 2023-09-29 NOTE — Assessment & Plan Note (Signed)
To follow-up for chronic conditions in one week. CMP today.

## 2023-09-29 NOTE — Assessment & Plan Note (Signed)
3 mm x 2 mm open area on left lower extremity. Area has improved with antibiotic ointment. Due to diabetes, will treat with Doxycycline 100 mg BID for 10 days. If symptoms do not improve or worsen, she will follow-up. Reports area has been superficial without purulent drainage or fever.  No indication for x-ray today.

## 2023-09-29 NOTE — Progress Notes (Signed)
Acute Office Visit  Subjective:     Patient ID: Megan Petersen, female    DOB: 1961/02/01, 63 y.o.   MRN: [id number]  Chief Complaint  Patient presents with   Abrasion    Left lower extremity     HPI Patient is in today for open area on left lower extremity. Present for 10 days.  Area has gotten smaller with application of antibiotic ointment.  No fever or chills. Unsure of drainage.  Diabetic.    Review of Systems  Constitutional:  Negative for chills and fever.        Objective:    BP 136/74 (BP Location: Right Arm, Patient Position: Sitting, Cuff Size: Large)   Pulse 76   Temp 98.7 F (37.1 C)   Resp 18   Ht 5\' 2"  (1.575 m)   Wt 273 lb 11.2 oz (124.1 kg)   SpO2 95%   BMI 50.06 kg/m  BP Readings from Last 3 Encounters:  09/29/23 136/74  05/28/23 130/70  04/16/23 128/76      Physical Exam Vitals and nursing note reviewed.  Constitutional:      Appearance: Normal appearance. She is obese.  Cardiovascular:     Rate and Rhythm: Regular rhythm.     Heart sounds: Normal heart sounds.  Pulmonary:     Effort: Pulmonary effort is normal.     Breath sounds: Normal breath sounds.  Skin:    General: Skin is warm and dry.     Comments: 3 mm x 2 mm open area with scab with some surrounding erythema.   Neurological:     General: No focal deficit present.     Mental Status: She is alert. Mental status is at baseline.  Psychiatric:        Mood and Affect: Mood normal.        Behavior: Behavior normal.        Thought Content: Thought content normal.        Judgment: Judgment normal.    No results found for any visits on 09/29/23.      Assessment & Plan:   Problem List Items Addressed This Visit     Essential hypertension, benign   To follow-up for chronic conditions in one week. CMP today.       Relevant Orders   Comprehensive metabolic panel   Type 2 diabetes mellitus (HCC)   A1C today for follow-up in one week for medication management.        Relevant Orders   Hemoglobin A1c   Cellulitis of left lower extremity - Primary   3 mm x 2 mm open area on left lower extremity. Area has improved with antibiotic ointment. Due to diabetes, will treat with Doxycycline 100 mg BID for 10 days. If symptoms do not improve or worsen, she will follow-up. Reports area has been superficial without purulent drainage or fever.  No indication for x-ray today.       Relevant Medications   doxycycline (VIBRA-TABS) 100 MG tablet    Meds ordered this encounter  Medications   doxycycline (VIBRA-TABS) 100 MG tablet    Sig: Take 1 tablet (100 mg total) by mouth 2 (two) times daily.    Dispense:  20 tablet    Refill:  0    Supervising Provider:   Suzan Slick [[id number]]  Agrees with plan of care discussed.  Questions answered.   Return in about 1 week (around 10/06/2023) for DM, HTN.  Novella Olive, FNP

## 2023-09-29 NOTE — Assessment & Plan Note (Signed)
A1C today for follow-up in one week for medication management.

## 2023-09-30 ENCOUNTER — Encounter: Payer: Self-pay | Admitting: Family Medicine

## 2023-09-30 LAB — HEMOGLOBIN A1C
Est. average glucose Bld gHb Est-mCnc: 192 mg/dL
Hgb A1c MFr Bld: 8.3 % — ABNORMAL HIGH (ref 4.8–5.6)

## 2023-09-30 LAB — COMPREHENSIVE METABOLIC PANEL
ALT: 32 [IU]/L (ref 0–32)
AST: 26 [IU]/L (ref 0–40)
Albumin: 4.1 g/dL (ref 3.9–4.9)
Alkaline Phosphatase: 129 [IU]/L — ABNORMAL HIGH (ref 44–121)
BUN/Creatinine Ratio: 21 (ref 12–28)
BUN: 16 mg/dL (ref 8–27)
Bilirubin Total: 0.5 mg/dL (ref 0.0–1.2)
CO2: 22 mmol/L (ref 20–29)
Calcium: 9.8 mg/dL (ref 8.7–10.3)
Chloride: 101 mmol/L (ref 96–106)
Creatinine, Ser: 0.75 mg/dL (ref 0.57–1.00)
Globulin, Total: 2.7 g/dL (ref 1.5–4.5)
Glucose: 234 mg/dL — ABNORMAL HIGH (ref 70–99)
Potassium: 4.7 mmol/L (ref 3.5–5.2)
Sodium: 141 mmol/L (ref 134–144)
Total Protein: 6.8 g/dL (ref 6.0–8.5)
eGFR: 89 mL/min/{1.73_m2} (ref >59)

## 2023-10-06 ENCOUNTER — Encounter: Payer: Self-pay | Admitting: Family Medicine

## 2023-10-06 ENCOUNTER — Ambulatory Visit (INDEPENDENT_AMBULATORY_CARE_PROVIDER_SITE_OTHER): Payer: Medicare Other | Admitting: Family Medicine

## 2023-10-06 VITALS — BP 134/80 | HR 84 | Temp 98.4°F | Resp 18 | Ht 62.0 in | Wt 275.6 lb

## 2023-10-06 DIAGNOSIS — E119 Type 2 diabetes mellitus without complications: Secondary | ICD-10-CM

## 2023-10-06 DIAGNOSIS — Z1231 Encounter for screening mammogram for malignant neoplasm of breast: Secondary | ICD-10-CM

## 2023-10-06 DIAGNOSIS — I10 Essential (primary) hypertension: Secondary | ICD-10-CM

## 2023-10-06 MED ORDER — CLONIDINE HCL 0.1 MG PO TABS: 0.1000 mg | ORAL_TABLET | Freq: Two times a day (BID) | ORAL | 1 refills | Status: DC

## 2023-10-06 MED ORDER — SEMAGLUTIDE (1 MG/DOSE) 4 MG/3ML ~~LOC~~ SOPN: 1.0000 mg | PEN_INJECTOR | SUBCUTANEOUS | 1 refills | Status: DC

## 2023-10-06 MED ORDER — IRBESARTAN 300 MG PO TABS: 300.0000 mg | ORAL_TABLET | Freq: Every day | ORAL | 1 refills | Status: DC

## 2023-10-06 NOTE — Assessment & Plan Note (Signed)
Screening mammogram reordered today. ?

## 2023-10-06 NOTE — Patient Instructions (Signed)
 Healthy Heart:  Recommend heart healthy/Mediterranean diet, with whole grains, fruits, vegetable, fish, lean meats, nuts, and olive oil. Limit salt. Recommend moderate walking, 3-5 times/week for 30-50 minutes each session. Aim for at least 150 minutes.week. Goal should be pace of 3 miles/hours, or walking 1.5 miles in 30 minutes Recommend avoidance of tobacco products. Avoid excess alcohol.

## 2023-10-06 NOTE — Progress Notes (Signed)
 +  Established Patient Office Visit  Subjective   Patient ID: Megan Petersen, female    DOB: 02/11/1961  Age: 63 y.o. MRN: [id number]  Chief Complaint  Patient presents with   Medical Management of Chronic Issues    Patient is here for a 1 week follow up for DM and HTN    HPI  Hypertension Medication compliance: Taking clonidine  0.1 mg BID and irbesartan  300 mg daily as prescribed.  Denies chest pain, shortness of breath, lower extremity edema, vision changes, headaches.  Pertinent lab work: 09/29/23: kidney function is normal.  Monitoring at home: occasionally monitors blood pressure, usually 130/80 Tolerating medication well: no side effects  Continue current medication regimen: no changes  Follow-up: 6 months   Diabetes: Medication compliance: Taking semaglutide  1 mg weekly as prescribed  Denies chest pain, shortness of breath, vision changes, polydipsia, polyphagia, polyuria. Denies hypoglycemia.  Pertinent lab work: A1C: 09/29/23: A1C 8.3 Monitoring: blood sugar readings at home: does not monitor consistently          Continue current medication regimen: no changes, discussed increasing dose, does not want to do this.   Well controlled: not well controlled, lifestyle changes have "slipped"   Follow-up: 6 months   Colonoscopy vs. Cologuard: recommend colonoscopy per family history. Schedule asap.   Mammogram vs ultrasound: screening mammogram ordered today  Parasite cleanse: no advice per PCP concerning this practice.     Review of Systems  Eyes:  Negative for blurred vision and double vision.  Respiratory:  Negative for shortness of breath.   Cardiovascular:  Negative for chest pain.  Neurological:  Negative for dizziness and headaches.      Objective:     BP 134/80   Pulse 84   Temp 98.4 F (36.9 C) (Oral)   Resp 18   Ht 5\' 2"  (1.575 m)   Wt 275 lb 9.6 oz (125 kg)   SpO2 94%   BMI 50.41 kg/m  BP Readings from Last 3 Encounters:  10/06/23 134/80   09/29/23 136/74  05/28/23 130/70      Physical Exam Vitals and nursing note reviewed.  Constitutional:      General: She is not in acute distress.    Appearance: Normal appearance. She is obese.  Cardiovascular:     Rate and Rhythm: Regular rhythm.     Heart sounds: Normal heart sounds.  Pulmonary:     Effort: Pulmonary effort is normal.     Breath sounds: Normal breath sounds.  Skin:    General: Skin is warm and dry.  Neurological:     General: No focal deficit present.     Mental Status: She is alert. Mental status is at baseline.  Psychiatric:        Mood and Affect: Mood normal.        Behavior: Behavior normal.        Thought Content: Thought content normal.        Judgment: Judgment normal.     No results found for any visits on 10/06/23.  Last metabolic panel Lab Results  Component Value Date   GLUCOSE 234 (H) 09/29/2023   NA 141 09/29/2023   K 4.7 09/29/2023   CL 101 09/29/2023   CO2 22 09/29/2023   BUN 16 09/29/2023   CREATININE 0.75 09/29/2023   EGFR 89 09/29/2023   CALCIUM 9.8 09/29/2023   PROT 6.8 09/29/2023   ALBUMIN 4.1 09/29/2023   LABGLOB 2.7 09/29/2023   AGRATIO 1.4 01/15/2023   BILITOT  0.5 09/29/2023   ALKPHOS 129 (H) 09/29/2023   AST 26 09/29/2023   ALT 32 09/29/2023   ANIONGAP 8 07/17/2017   Last hemoglobin A1c Lab Results  Component Value Date   HGBA1C 8.3 (H) 09/29/2023      The 10-year ASCVD risk score (Arnett DK, et al., 2019) is: 12.9%    Assessment & Plan:   Problem List Items Addressed This Visit     Type 2 diabetes mellitus (HCC)   Taking semaglutide  1 mg weekly as prescribed.  Denies episodes of hypoglycemia, polydipsia, polyphagia.  Last A1c increased to 8.3.  Discussed increasing dose of semaglutide  today, declines due to possible side effects.  Reports that her lifestyle modifications have slipped, she will start Mediterranean diet and increase exercise to 150 mg/week.  Follow-up in 3 months for A1C check.        Relevant Medications   Semaglutide , 1 MG/DOSE, 4 MG/3ML SOPN   irbesartan  (AVAPRO ) 300 MG tablet   Essential hypertension   Taking clonidine  0.1 mg twice daily and irbesartan  300 mg daily as prescribed. denies chest pain, shortness of breath, lower extremity edema, vision changes and headaches.  CMP shows normal kidney function.  Reports that she occasionally takes her blood pressure usually at 130/80.  It is well-controlled at home.  No changes in current medication regimen.  Follow-up in 6 months, sooner if needed.  Recommend DASH diet and moderate exercise. Labs ordered at last acute visit for this follow-up today.       Relevant Medications   cloNIDine  (CATAPRES ) 0.1 MG tablet   irbesartan  (AVAPRO ) 300 MG tablet   Encounter for screening mammogram for malignant neoplasm of breast - Primary   Screening mammogram reordered today.      Relevant Orders   MM DIGITAL SCREENING BILATERAL  Agrees with plan of care discussed.  Questions answered.   Return in about 6 months (around 04/04/2024) for HTN, DM.    Mickiel Albany, FNP

## 2023-10-06 NOTE — Assessment & Plan Note (Addendum)
 Taking clonidine  0.1 mg twice daily and irbesartan  300 mg daily as prescribed. denies chest pain, shortness of breath, lower extremity edema, vision changes and headaches.  CMP shows normal kidney function.  Reports that she occasionally takes her blood pressure usually at 130/80.  It is well-controlled at home.  No changes in current medication regimen.  Follow-up in 6 months, sooner if needed.  Recommend DASH diet and moderate exercise. Labs ordered at last acute visit for this follow-up today.

## 2023-10-06 NOTE — Assessment & Plan Note (Addendum)
 Taking semaglutide  1 mg weekly as prescribed.  Denies episodes of hypoglycemia, polydipsia, polyphagia.  Last A1c increased to 8.3.  Discussed increasing dose of semaglutide  today, declines due to possible side effects.  Reports that her lifestyle modifications have slipped, she will start Mediterranean diet and increase exercise to 150 mg/week.  Follow-up in 3 months for A1C check.

## 2023-10-30 ENCOUNTER — Encounter: Payer: Self-pay | Admitting: Family Medicine

## 2023-10-30 ENCOUNTER — Ambulatory Visit: Admitting: Family Medicine

## 2023-10-30 VITALS — BP 140/72 | HR 82 | Temp 98.9°F | Resp 18 | Ht 62.0 in | Wt 275.8 lb

## 2023-10-30 DIAGNOSIS — J0100 Acute maxillary sinusitis, unspecified: Secondary | ICD-10-CM | POA: Diagnosis not present

## 2023-10-30 DIAGNOSIS — H1033 Unspecified acute conjunctivitis, bilateral: Secondary | ICD-10-CM

## 2023-10-30 MED ORDER — AZITHROMYCIN 250 MG PO TABS: ORAL_TABLET | ORAL | 0 refills | Status: AC

## 2023-10-30 MED ORDER — BACITRACIN-POLYMYXIN B 500-10000 UNIT/GM OP OINT: 1.0000 | TOPICAL_OINTMENT | Freq: Two times a day (BID) | OPHTHALMIC | 0 refills | Status: DC

## 2023-10-30 NOTE — Assessment & Plan Note (Signed)
 Conjuntiva with erythema, reports exudate in the mornings. Polysporin opth drops 1 drop in each eye BID.

## 2023-10-30 NOTE — Assessment & Plan Note (Addendum)
 Sinus pressure, facial pain, nasal congestion, and bilateral ear pain.  TMs normal.  Symptoms are consistent with acute sinusitis.  Allergic to PCN. Azithromycin 500 mg today, then 250 mg for next 4 days. Saline rinses. Follow-up if symptoms do not resolve.

## 2023-10-30 NOTE — Progress Notes (Signed)
 Acute Office Visit  Subjective:     Patient ID: Megan Petersen, female    DOB: 1961/04/29, 63 y.o.   MRN: [id number]  Chief Complaint  Patient presents with   Sinusitis    Patient states that she has had symptoms that started about a week  ago, sinus pressure, face pain, nasal drainage, and ear pain    HPI Patient is in today for for possible sinus infection. Reports sinus pressure, facial pain, and ear pain on both sides. Symptoms present for over one week. Denies recent viral illness. No fever or chills.    Review of Systems  HENT:  Positive for congestion, ear pain and sinus pain.         Objective:    BP (!) 140/72 (BP Location: Right Arm, Patient Position: Sitting, Cuff Size: Large)   Pulse 82   Temp 98.9 F (37.2 C) (Oral)   Resp 18   Ht 5\' 2"  (1.575 m)   Wt 275 lb 12.8 oz (125.1 kg)   SpO2 94%   BMI 50.44 kg/m  BP Readings from Last 3 Encounters:  10/30/23 (!) 140/72  10/06/23 134/80  09/29/23 136/74      Physical Exam Vitals and nursing note reviewed.  Constitutional:      General: She is not in acute distress.    Appearance: Normal appearance. She is obese. She is ill-appearing.  HENT:     Right Ear: Tympanic membrane normal.     Left Ear: Tympanic membrane normal.     Nose:     Right Sinus: Maxillary sinus tenderness present.     Left Sinus: Maxillary sinus tenderness present.     Mouth/Throat:     Pharynx: Uvula midline. No oropharyngeal exudate or posterior oropharyngeal erythema.  Eyes:     General: Vision grossly intact.        Right eye: Discharge present.        Left eye: Discharge present.    Extraocular Movements: Extraocular movements intact.     Conjunctiva/sclera:     Right eye: Right conjunctiva is injected.     Left eye: Left conjunctiva is injected.  Cardiovascular:     Rate and Rhythm: Regular rhythm.     Heart sounds: Normal heart sounds.  Pulmonary:     Effort: Pulmonary effort is normal.     Breath sounds: Normal  breath sounds.  Skin:    General: Skin is warm and dry.  Neurological:     General: No focal deficit present.     Mental Status: She is alert. Mental status is at baseline.  Psychiatric:        Mood and Affect: Mood normal.        Behavior: Behavior normal.        Thought Content: Thought content normal.        Judgment: Judgment normal.    No results found for any visits on 10/30/23.      Assessment & Plan:   Problem List Items Addressed This Visit     Acute non-recurrent maxillary sinusitis - Primary   Sinus pressure, facial pain, nasal congestion, and bilateral ear pain.  TMs normal.  Symptoms are consistent with acute sinusitis.  Allergic to PCN. Azithromycin 500 mg today, then 250 mg for next 4 days. Saline rinses. Follow-up if symptoms do not resolve.       Relevant Medications   azithromycin (ZITHROMAX) 250 MG tablet   Acute bacterial conjunctivitis of both eyes   Conjuntiva with  erythema, reports exudate in the mornings. Polysporin opth drops 1 drop in each eye BID.        Relevant Medications   azithromycin (ZITHROMAX) 250 MG tablet   bacitracin-polymyxin b (POLYSPORIN) ophthalmic ointment    Meds ordered this encounter  Medications   azithromycin (ZITHROMAX) 250 MG tablet    Sig: Take 2 tablets by mouth on day 1, then 1 tablet by mouth daily on days 2 through 5.    Dispense:  6 tablet    Refill:  0    Tolerates azithromycin    Supervising Provider:   Suzan Slick [[id number]]   bacitracin-polymyxin b (POLYSPORIN) ophthalmic ointment    Sig: Place 1 Application into both eyes 2 (two) times daily.    Dispense:  3.5 g    Refill:  0    Supervising Provider:   Suzan Slick [[id number]]  Agrees with plan of care discussed.  Questions answered.   Return if symptoms worsen or fail to improve.  Novella Olive, FNP

## 2023-12-02 ENCOUNTER — Other Ambulatory Visit: Payer: Self-pay | Admitting: Family Medicine

## 2023-12-02 DIAGNOSIS — K219 Gastro-esophageal reflux disease without esophagitis: Secondary | ICD-10-CM

## 2023-12-03 ENCOUNTER — Ambulatory Visit: Payer: Medicare Other

## 2023-12-03 DIAGNOSIS — Z1231 Encounter for screening mammogram for malignant neoplasm of breast: Secondary | ICD-10-CM

## 2023-12-09 ENCOUNTER — Encounter: Payer: Self-pay | Admitting: Family Medicine

## 2024-01-22 ENCOUNTER — Other Ambulatory Visit: Payer: Self-pay | Admitting: Family Medicine

## 2024-01-22 DIAGNOSIS — M62838 Other muscle spasm: Secondary | ICD-10-CM

## 2024-01-22 DIAGNOSIS — G8929 Other chronic pain: Secondary | ICD-10-CM

## 2024-01-22 DIAGNOSIS — J4520 Mild intermittent asthma, uncomplicated: Secondary | ICD-10-CM

## 2024-03-03 ENCOUNTER — Ambulatory Visit (INDEPENDENT_AMBULATORY_CARE_PROVIDER_SITE_OTHER): Admitting: Family Medicine

## 2024-03-03 ENCOUNTER — Encounter: Payer: Self-pay | Admitting: Family Medicine

## 2024-03-03 VITALS — BP 134/79 | HR 93 | Temp 98.6°F | Ht 62.0 in | Wt 274.0 lb

## 2024-03-03 DIAGNOSIS — E119 Type 2 diabetes mellitus without complications: Secondary | ICD-10-CM | POA: Diagnosis not present

## 2024-03-03 DIAGNOSIS — Z1211 Encounter for screening for malignant neoplasm of colon: Secondary | ICD-10-CM

## 2024-03-03 DIAGNOSIS — I10 Essential (primary) hypertension: Secondary | ICD-10-CM

## 2024-03-03 DIAGNOSIS — R609 Edema, unspecified: Secondary | ICD-10-CM

## 2024-03-03 MED ORDER — CLONIDINE HCL 0.1 MG PO TABS: 0.1000 mg | ORAL_TABLET | Freq: Two times a day (BID) | ORAL | 0 refills | Status: DC

## 2024-03-03 MED ORDER — FUROSEMIDE 40 MG PO TABS: 40.0000 mg | ORAL_TABLET | Freq: Two times a day (BID) | ORAL | 0 refills | Status: DC

## 2024-03-03 MED ORDER — IRBESARTAN 300 MG PO TABS: 300.0000 mg | ORAL_TABLET | Freq: Every day | ORAL | 0 refills | Status: DC

## 2024-03-03 NOTE — Progress Notes (Signed)
 Established Patient Office Visit  Subjective   Patient ID: Megan Petersen, female    DOB: Mar 24, 1961  Age: 63 y.o. MRN: [id number]  Chief Complaint  Patient presents with   Edema    Bilateral - lower extremeties    HPI  Hypertension Medication compliance: Clonidine  0.1 mg BID and irbesartan  300 mg daily, Lasix  80  mg daily.  Denies chest pain, shortness of breath, vision changes, headaches. Endorses lower extremity edema: this is not new. Present for  years,  Pertinent lab work: 2/3 CMP GFR 89 Monitoring at home: gets elevated numbers at home.  Tolerating medication well: no side effects reported Continue current medication regimen: well controlled in office today.  Follow-up: 3 months   Diabetes: Medication compliance: Taking semaglutide   1 mg weekly.  Denies chest pain, shortness of breath, vision changes, polydipsia, polyphagia, polyuria. Denies hypoglycemia.  Pertinent lab work: A1C: 2/9 A1C 8.3  Monitoring: blood sugar readings at home: does not monitor at home.           Continue current medication regimen: increase semaglutide  today  Well controlled: check A1C   Follow-up: 3 months   Right lower leg edema: This is not a new problem. 1-2 + edema noted. No calf pain. Taking lasix  40 mg BID. 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       Review of Systems  Eyes:  Negative for blurred vision and double vision.  Respiratory:  Negative for shortness of breath.   Cardiovascular:  Negative for chest pain.  Neurological:  Negative for headaches.      Objective:     BP 134/79 (BP Location: Left Arm, Patient Position: Sitting, Cuff Size: Large)   Pulse 93   Temp 98.6 F (37 C) (Oral)   Ht 5' 2 (1.575 m)   Wt 274 lb (124.3 kg)   SpO2 92%   BMI 50.12 kg/m    Physical Exam Vitals and nursing note reviewed.  Constitutional:      Appearance: Normal appearance.  Cardiovascular:     Rate and Rhythm: Normal rate and regular rhythm.      Heart sounds: Normal heart sounds.  Pulmonary:     Effort: Pulmonary effort is normal.     Breath sounds: Normal breath sounds.  Musculoskeletal:     Right lower leg: 1+ Edema present.     Left lower leg: No edema.  Skin:    General: Skin is warm and dry.  Neurological:     General: No focal deficit present.     Mental Status: She is alert. Mental status is at baseline.  Psychiatric:        Mood and Affect: Mood normal.        Behavior: Behavior normal.        Thought Content: Thought content normal.        Judgment: Judgment normal.      No results found for any visits on 03/03/24.    The 10-year ASCVD risk score (Arnett DK, et al., 2019) is: 12.9%    Assessment & Plan:   Problem List Items Addressed This Visit     Edema   Right lower extremity edema present for years since having a forklift accident. There is no calf pain. Taking lasix  40 mg BID. Recommend purchasing compression hose that fits. Elevating legs. Drinking lots of water . Avoiding sodium. Will consider referral to vascular is symptoms do not improve.  CMP today.        Relevant  Medications   furosemide  (LASIX ) 40 MG tablet   Other Relevant Orders   Comprehensive metabolic panel with GFR   Type 2 diabetes mellitus (HCC) - Primary   Taking semaglutide   1 mg weekly.  Denies chest pain, shortness of breath, vision changes, polydipsia, polyphagia, polyuria. Denies hypoglycemia.  Does not monitor blood sugar at home. Last A1C 2/9: recheck today. Not ready for increase in semaglutide , just opened a new box. Will need to increase dose soon. A1C Urine microalbumin today.  Follow-up in 3 months.       Relevant Medications   irbesartan  (AVAPRO ) 300 MG tablet   Other Relevant Orders   Hemoglobin A1c   Microalbumin / creatinine urine ratio   Essential hypertension   Clonidine  0.1 mg BID and irbesartan  300 mg daily, Lasix  80  mg daily.  Denies chest pain, shortness of breath, vision changes,  headaches. Endorses lower extremity edema: this is not new. Present for  years since having a forklift accident. Blood pressure well controlled in office today. Refills sent. Follow-up in 3 months. CMP         Relevant Medications   irbesartan  (AVAPRO ) 300 MG tablet   cloNIDine  (CATAPRES ) 0.1 MG tablet   furosemide  (LASIX ) 40 MG tablet   Other Relevant Orders   Comprehensive metabolic panel with GFR   Screening for colon cancer   Relevant Orders   Cologuard  Agrees with plan of care discussed.  Questions answered.   Return in about 3 months (around 06/03/2024) for HTN, DM.    Darice JONELLE Brownie, FNP

## 2024-03-03 NOTE — Assessment & Plan Note (Signed)
 Taking semaglutide   1 mg weekly.  Denies chest pain, shortness of breath, vision changes, polydipsia, polyphagia, polyuria. Denies hypoglycemia.  Does not monitor blood sugar at home. Last A1C 2/9: recheck today. Not ready for increase in semaglutide , just opened a new box. Will need to increase dose soon. A1C Urine microalbumin today.  Follow-up in 3 months.

## 2024-03-03 NOTE — Assessment & Plan Note (Signed)
 Clonidine  0.1 mg BID and irbesartan  300 mg daily, Lasix  80  mg daily.  Denies chest pain, shortness of breath, vision changes, headaches. Endorses lower extremity edema: this is not new. Present for  years since having a forklift accident. Blood pressure well controlled in office today. Refills sent. Follow-up in 3 months. CMP

## 2024-03-03 NOTE — Assessment & Plan Note (Addendum)
 Right lower extremity edema present for years since having a forklift accident. There is no calf pain. Taking lasix  40 mg BID. Recommend purchasing compression hose that fits. Elevating legs. Drinking lots of water . Avoiding sodium. Will consider referral to vascular is symptoms do not improve.  CMP today.

## 2024-03-04 ENCOUNTER — Ambulatory Visit: Payer: Self-pay | Admitting: Family Medicine

## 2024-03-04 LAB — COMPREHENSIVE METABOLIC PANEL WITH GFR
ALT: 42 IU/L — ABNORMAL HIGH (ref 0–32)
AST: 37 IU/L (ref 0–40)
Albumin: 4.1 g/dL (ref 3.9–4.9)
Alkaline Phosphatase: 128 IU/L — ABNORMAL HIGH (ref 44–121)
BUN/Creatinine Ratio: 19 (ref 12–28)
BUN: 12 mg/dL (ref 8–27)
Bilirubin Total: 0.5 mg/dL (ref 0.0–1.2)
CO2: 24 mmol/L (ref 20–29)
Calcium: 9.3 mg/dL (ref 8.7–10.3)
Chloride: 97 mmol/L (ref 96–106)
Creatinine, Ser: 0.62 mg/dL (ref 0.57–1.00)
Globulin, Total: 2.9 g/dL (ref 1.5–4.5)
Glucose: 298 mg/dL — ABNORMAL HIGH (ref 70–99)
Potassium: 4.4 mmol/L (ref 3.5–5.2)
Sodium: 137 mmol/L (ref 134–144)
Total Protein: 7 g/dL (ref 6.0–8.5)
eGFR: 100 mL/min/1.73 (ref >59)

## 2024-03-04 LAB — MICROALBUMIN / CREATININE URINE RATIO
Creatinine, Urine: 85.8 mg/dL
Microalb/Creat Ratio: 22 mg/g{creat} (ref 0–29)
Microalbumin, Urine: 19.3 ug/mL

## 2024-03-04 LAB — HEMOGLOBIN A1C
Est. average glucose Bld gHb Est-mCnc: 260 mg/dL
Hgb A1c MFr Bld: 10.7 % — ABNORMAL HIGH (ref 4.8–5.6)

## 2024-03-10 ENCOUNTER — Encounter: Payer: Self-pay | Admitting: Family Medicine

## 2024-03-26 LAB — COLOGUARD: COLOGUARD: NEGATIVE

## 2024-04-05 ENCOUNTER — Ambulatory Visit: Payer: Medicare Other | Admitting: Family Medicine

## 2024-04-20 ENCOUNTER — Other Ambulatory Visit: Payer: Self-pay | Admitting: Family Medicine

## 2024-04-20 DIAGNOSIS — K219 Gastro-esophageal reflux disease without esophagitis: Secondary | ICD-10-CM

## 2024-04-29 ENCOUNTER — Other Ambulatory Visit: Payer: Self-pay | Admitting: Family Medicine

## 2024-04-29 DIAGNOSIS — E119 Type 2 diabetes mellitus without complications: Secondary | ICD-10-CM

## 2024-05-11 ENCOUNTER — Ambulatory Visit (INDEPENDENT_AMBULATORY_CARE_PROVIDER_SITE_OTHER): Admitting: Family Medicine

## 2024-05-11 ENCOUNTER — Encounter: Payer: Self-pay | Admitting: Family Medicine

## 2024-05-11 VITALS — BP 140/80 | HR 79 | Temp 98.7°F | Resp 18 | Ht 62.0 in | Wt 272.2 lb

## 2024-05-11 DIAGNOSIS — J0100 Acute maxillary sinusitis, unspecified: Secondary | ICD-10-CM | POA: Diagnosis not present

## 2024-05-11 DIAGNOSIS — I10 Essential (primary) hypertension: Secondary | ICD-10-CM | POA: Diagnosis not present

## 2024-05-11 MED ORDER — LEVOFLOXACIN 500 MG PO TABS: 500.0000 mg | ORAL_TABLET | Freq: Every day | ORAL | 0 refills | Status: AC

## 2024-05-11 NOTE — Assessment & Plan Note (Signed)
 Sinus pain and pressure in head worse with turning her head. There is significant maxillary sinus pain with palpation today.  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  Does not tolerate doxycycline  well. Has allergy to PCN Levaquin  500 mg daily for 7 days. Sinus rinses. Flonase nasal spray as needed. Follow-up with PCP if symptoms do not resolve.

## 2024-05-11 NOTE — Assessment & Plan Note (Signed)
 Blood pressure elevated today likely due to headache. She will monitor at home and notify provider if does not return to normal. She is compliant with current medication regimen. Due for second dose of clonidine  tonight.

## 2024-05-11 NOTE — Progress Notes (Signed)
 Acute Office Visit  Subjective:     Patient ID: Megan Petersen, female    DOB: [birth year], 62 y.o.   MRN: [id number]  Chief Complaint  Patient presents with   Headache    Patient is here due to headaches that she has been having for the last 3 weeks, She states that the headaches are mostly located in the front of her head, although she does have complaints of the headache being near the occipital region,  and when she turns her head to the left she gets a sharp pain in the front of her head.    HPI Patient is in today for headache in frontal and back of head and neck. Neck muscles felt better with gently massage in the office.  ROM intact to neck. No fever.  Left ear pain. Thinks it is allergies.  Nasal congestion in the mornings. Symptoms present off and on for three weeks. OTC dayquil    ROS      Objective:    BP (!) 140/80 (Patient Position: Sitting, Cuff Size: Large)   Pulse 79   Temp 98.7 F (37.1 C) (Oral)   Resp 18   Ht 5' 2 (1.575 m)   Wt 272 lb 3.2 oz (123.5 kg)   SpO2 96%   BMI 49.79 kg/m  BP Readings from Last 3 Encounters:  05/11/24 (!) 140/80  03/03/24 134/79  10/30/23 (!) 140/72      Physical Exam Vitals and nursing note reviewed.  Constitutional:      General: She is not in acute distress.    Appearance: She is well-developed. She is ill-appearing.  HENT:     Right Ear: Tympanic membrane normal.     Left Ear: Tympanic membrane normal.     Nose:     Right Sinus: Maxillary sinus tenderness present.     Left Sinus: Maxillary sinus tenderness present.     Mouth/Throat:     Pharynx: Uvula midline. No oropharyngeal exudate or posterior oropharyngeal erythema.  Cardiovascular:     Rate and Rhythm: Normal rate and regular rhythm.     Heart sounds: Normal heart sounds.  Pulmonary:     Effort: Pulmonary effort is normal.     Breath sounds: Normal breath sounds.  Skin:    General: Skin is warm and dry.  Neurological:     General: No  focal deficit present.     Mental Status: She is alert. Mental status is at baseline.  Psychiatric:        Mood and Affect: Mood normal.        Behavior: Behavior normal.        Thought Content: Thought content normal.        Judgment: Judgment normal.     No results found for any visits on 05/11/24.      Assessment & Plan:   Problem List Items Addressed This Visit     Elevated blood pressure reading in office with diagnosis of hypertension   Blood pressure elevated today likely due to headache. She will monitor at home and notify provider if does not return to normal. She is compliant with current medication regimen. Due for second dose of clonidine  tonight.        Acute non-recurrent maxillary sinusitis - Primary   Sinus pain and pressure in head worse with turning her head. There is significant maxillary sinus pain with palpation today.  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  Does not tolerate doxycycline  well. Has allergy to PCN Levaquin  500 mg daily for 7 days. Sinus rinses. Flonase nasal spray as needed. Follow-up with PCP if symptoms do not resolve.       Relevant Medications   levofloxacin  (LEVAQUIN ) 500 MG tablet    Meds ordered this encounter  Medications   levofloxacin  (LEVAQUIN ) 500 MG tablet    Sig: Take 1 tablet (500 mg total) by mouth daily for 7 days.    Dispense:  7 tablet    Refill:  0    Supervising Provider:   METHENEY, CATHERINE D [[id number]]  Agrees with plan of care discussed.  Questions answered.   Return if symptoms worsen or fail to improve.  Darice JONELLE Brownie, FNP

## 2024-06-17 ENCOUNTER — Ambulatory Visit (INDEPENDENT_AMBULATORY_CARE_PROVIDER_SITE_OTHER): Admitting: Family Medicine

## 2024-06-17 ENCOUNTER — Ambulatory Visit: Payer: Self-pay | Admitting: Family Medicine

## 2024-06-17 ENCOUNTER — Encounter: Payer: Self-pay | Admitting: Family Medicine

## 2024-06-17 ENCOUNTER — Ambulatory Visit

## 2024-06-17 VITALS — BP 131/79 | HR 77 | Temp 98.2°F | Ht 62.0 in | Wt 270.0 lb

## 2024-06-17 DIAGNOSIS — R600 Localized edema: Secondary | ICD-10-CM

## 2024-06-17 DIAGNOSIS — M25572 Pain in left ankle and joints of left foot: Secondary | ICD-10-CM | POA: Insufficient documentation

## 2024-06-17 DIAGNOSIS — R609 Edema, unspecified: Secondary | ICD-10-CM

## 2024-06-17 NOTE — Assessment & Plan Note (Signed)
 Left lower extremity edema worse than right. No calf pain. Left calf significantly larger than right. Will sent to rule out DVT. BNP ordered. No shortness of breath. Will likely need referral to either vascular or cardiology depending on lab result.

## 2024-06-17 NOTE — Progress Notes (Signed)
   Established Patient Office Visit  Subjective   Patient ID: Megan Petersen, female    DOB: May 30, 1961  Age: 63 y.o. MRN: [id number]  Chief Complaint  Patient presents with   Leg Swelling    Both legs are red and swollen. Left ankle has pain    Bilateral lower extremity edema. Left > right Present for 2 weeks.  There is skin discoloration bilaterally Left> right. No calf pain. Pain with walking around ankle. History of tibia fracture 2-3 years ago. Right calf: 50.5 cm Left calf: 55 cm Pain with palpation on left above foot. Denies SOB        ROS    Objective:     BP 131/79 (BP Location: Left Arm, Patient Position: Sitting, Cuff Size: Large)   Pulse 77   Temp 98.2 F (36.8 C) (Oral)   Ht 5' 2 (1.575 m)   Wt 270 lb (122.5 kg)   LMP 05/01/2016   SpO2 95%   BMI 49.38 kg/m    Physical Exam Vitals and nursing note reviewed.  Constitutional:      General: She is not in acute distress.    Appearance: Normal appearance. She is obese.  Cardiovascular:     Rate and Rhythm: Regular rhythm.     Heart sounds: Normal heart sounds.  Pulmonary:     Effort: Pulmonary effort is normal.     Breath sounds: Normal breath sounds.  Musculoskeletal:     Comments: Left calf: 55 cm Right calf: 50.5 cm   Skin:    General: Skin is warm and dry.  Neurological:     General: No focal deficit present.     Mental Status: She is alert. Mental status is at baseline.  Psychiatric:        Mood and Affect: Mood normal.        Behavior: Behavior normal.        Thought Content: Thought content normal.        Judgment: Judgment normal.      No results found for any visits on 06/17/24.    The 10-year ASCVD risk score (Arnett DK, et al., 2019) is: 12.4%    Assessment & Plan:   Problem List Items Addressed This Visit     Edema of left lower extremity - Primary   Left lower extremity edema worse than right. No calf pain. Left calf significantly larger than right. Will  sent to rule out DVT. BNP ordered. No shortness of breath. Will likely need referral to either vascular or cardiology depending on lab result.        Relevant Orders   DG Ankle Complete Left (Completed)   B Nat Peptide   US  Venous Img Lower Unilateral Left (DVT) (Completed)   Acute left ankle pain   History of fracture 2-3 years ago. Pain with standing. Will send to x-ray. May benefit from ortho referral.       Relevant Orders   DG Ankle Complete Left (Completed)  Agrees with plan of care discussed.  Questions answered.   Return if symptoms worsen or fail to improve.    Darice JONELLE Brownie, FNP

## 2024-06-17 NOTE — Assessment & Plan Note (Signed)
 History of fracture 2-3 years ago. Pain with standing. Will send to x-ray. May benefit from ortho referral.

## 2024-06-18 LAB — BRAIN NATRIURETIC PEPTIDE: BNP: 14.3 pg/mL (ref 0.0–100.0)

## 2024-07-04 ENCOUNTER — Other Ambulatory Visit: Payer: Self-pay | Admitting: Family Medicine

## 2024-07-04 DIAGNOSIS — R609 Edema, unspecified: Secondary | ICD-10-CM

## 2024-07-04 DIAGNOSIS — I10 Essential (primary) hypertension: Secondary | ICD-10-CM

## 2024-07-12 LAB — OPHTHALMOLOGY REPORT-SCANNED

## 2024-09-28 ENCOUNTER — Other Ambulatory Visit: Payer: Self-pay | Admitting: Family Medicine

## 2024-09-28 DIAGNOSIS — J4520 Mild intermittent asthma, uncomplicated: Secondary | ICD-10-CM

## 2024-09-28 DIAGNOSIS — I10 Essential (primary) hypertension: Secondary | ICD-10-CM
